# Patient Record
Sex: Male | Born: 1960 | Race: Black or African American | Hispanic: No | Marital: Married | Smoking: Never smoker
Health system: Southern US, Community
[De-identification: ages and names within clinical notes are randomized; demographics above are authoritative.]

## PROBLEM LIST (undated history)

## (undated) DIAGNOSIS — E119 Type 2 diabetes mellitus without complications: Secondary | ICD-10-CM

## (undated) DIAGNOSIS — I10 Essential (primary) hypertension: Secondary | ICD-10-CM

## (undated) HISTORY — PX: OTHER SURGICAL HISTORY: SHX169

---

## 2008-02-14 ENCOUNTER — Other Ambulatory Visit: Payer: Self-pay

## 2008-02-14 ENCOUNTER — Emergency Department: Payer: Self-pay | Admitting: Emergency Medicine

## 2008-08-23 ENCOUNTER — Emergency Department (HOSPITAL_COMMUNITY): Admission: EM | Admit: 2008-08-23 | Discharge: 2008-08-23 | Payer: Self-pay | Admitting: Emergency Medicine

## 2009-05-01 IMAGING — CT CT HEAD W/O CM
1 series · 16 of 30 positions shown, 20 images · non-contrast
Comparison: None.

CLINICAL DATA: Facial numbness.

CT HEAD WITHOUT CONTRAST
TECHNIQUE: Contiguous axial images were obtained from the base of
the skull through the vertex without contrast.

[Series 2: head_seq 4.5 h37s st · axial · 0.43mm/px · z∈[-176,-50]mm · 16 of 32 slices shown, 20 images]
[im 2/32  brain]
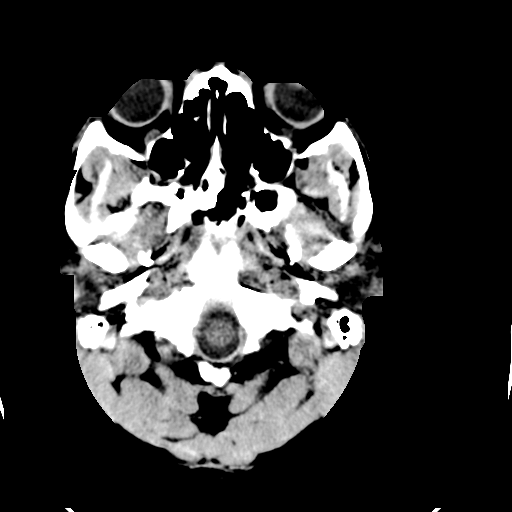
[im 2/32  bone]
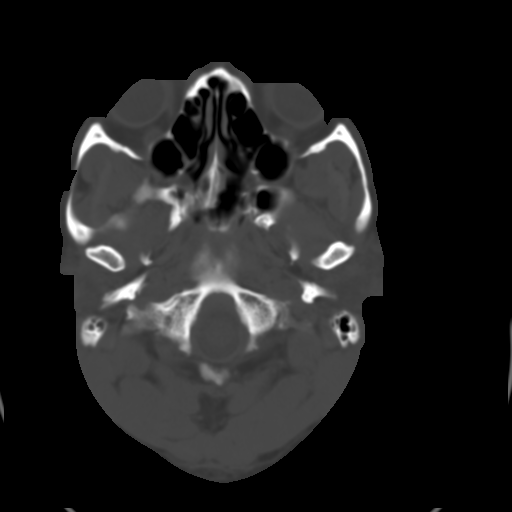
[im 4/32  brain]
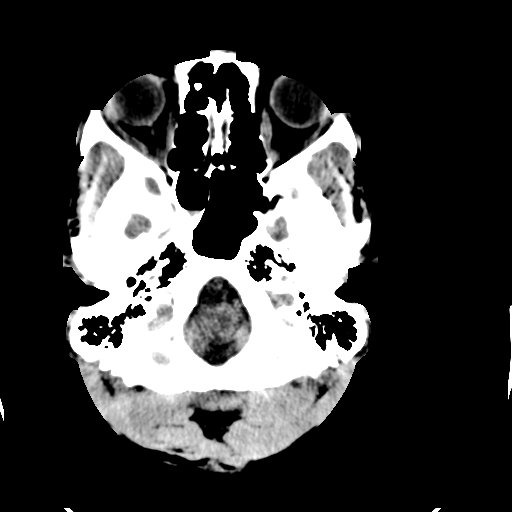
[im 6/32  brain]
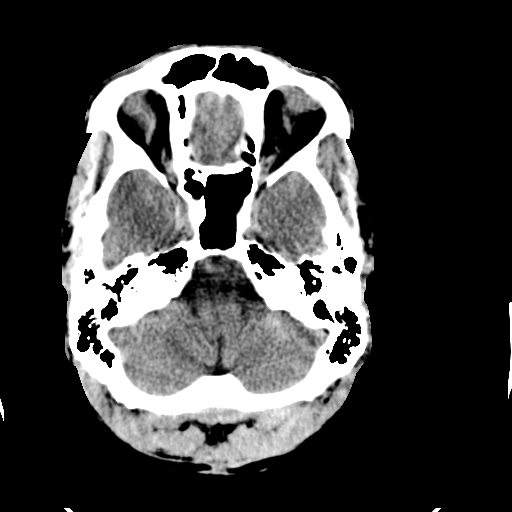
[im 8/32  brain]
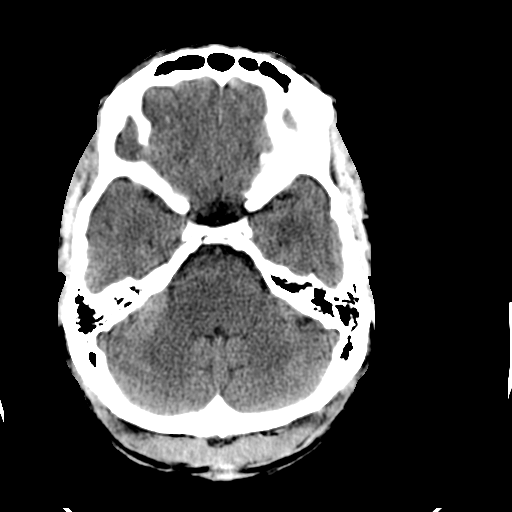
[im 9/32  brain]
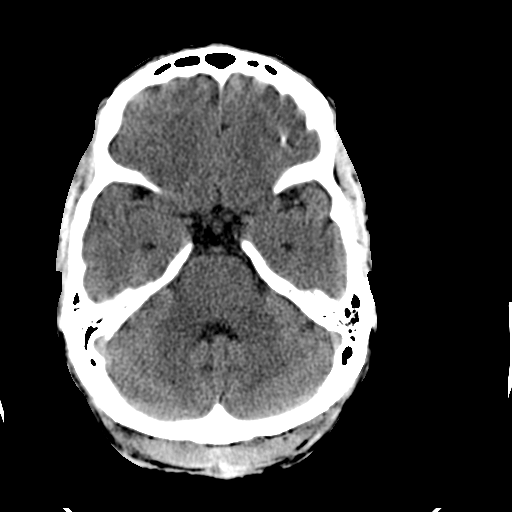
[im 9/32  bone]
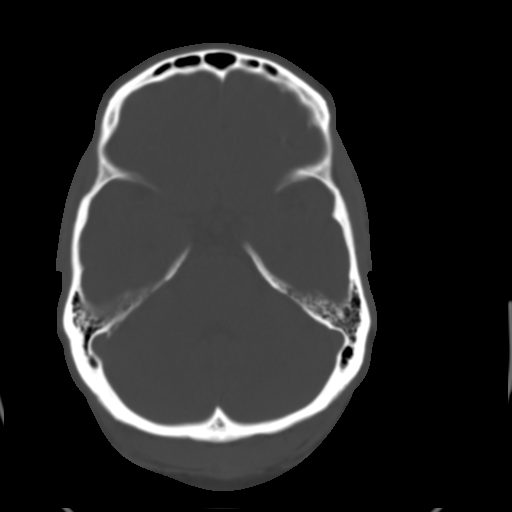
[im 11/32  brain]
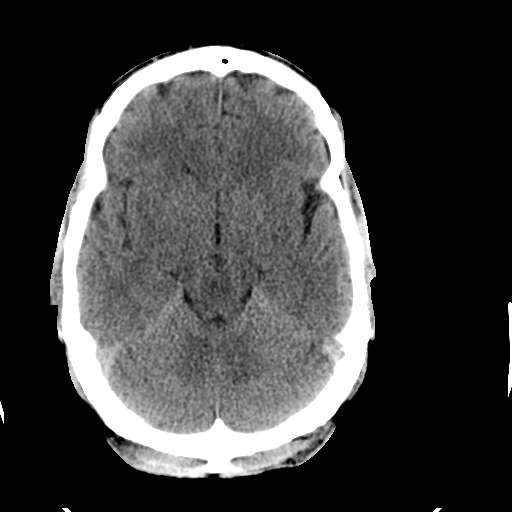
[im 13/32  brain]
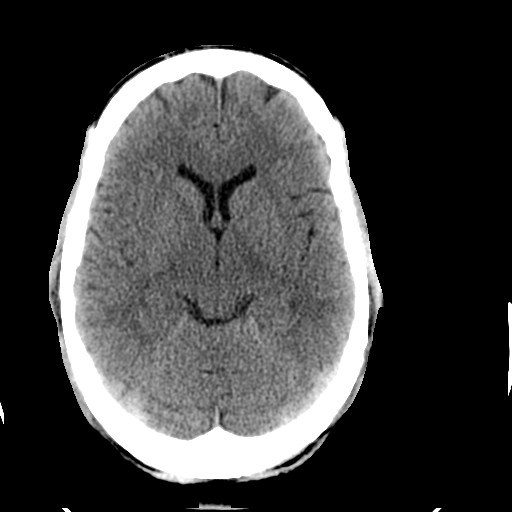
[im 15/32  brain]
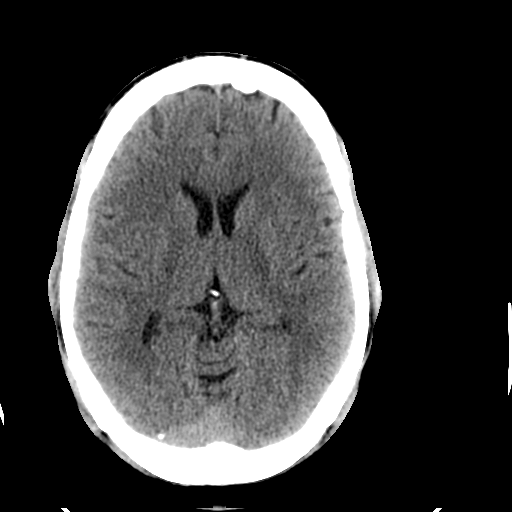
[im 17/32  brain]
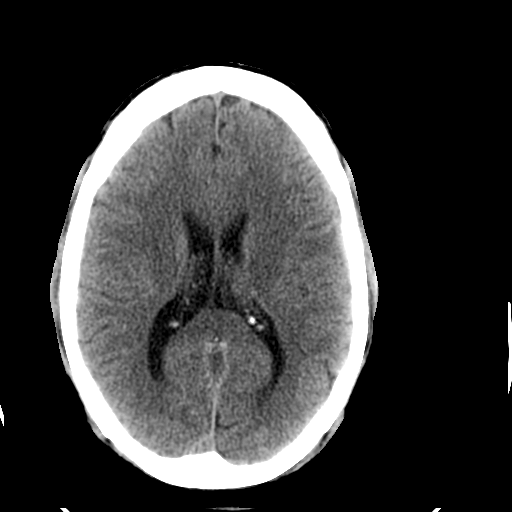
[im 17/32  bone]
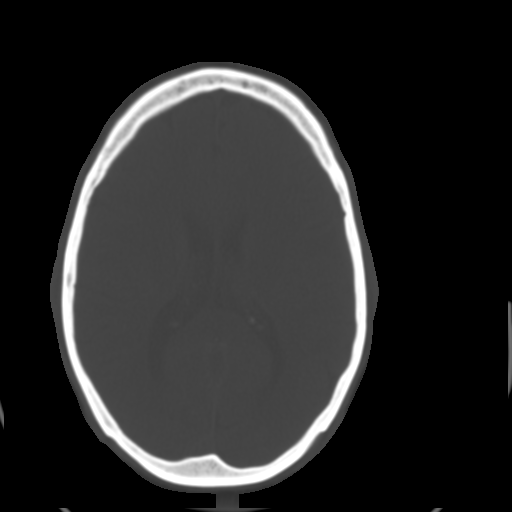
[im 19/32  brain]
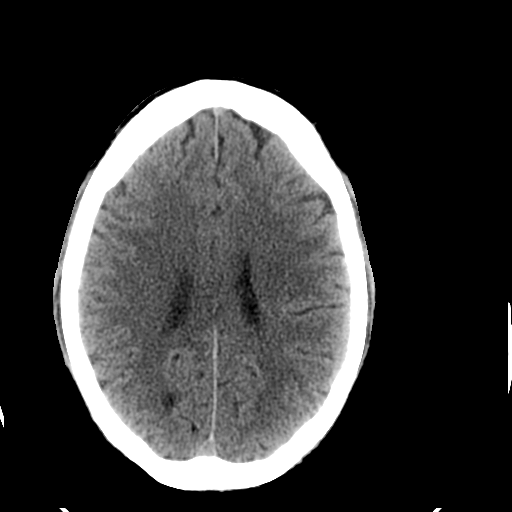
[im 21/32  brain]
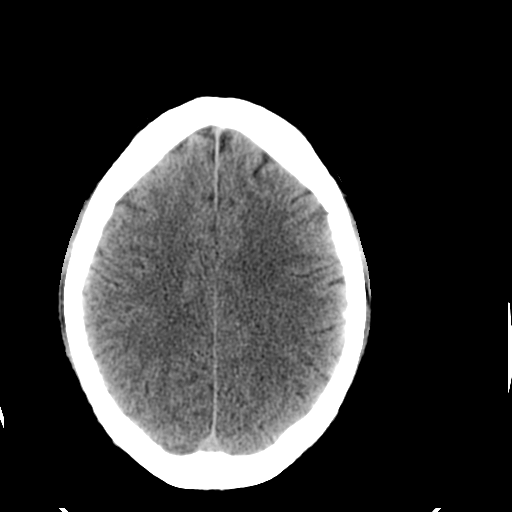
[im 23/32  brain]
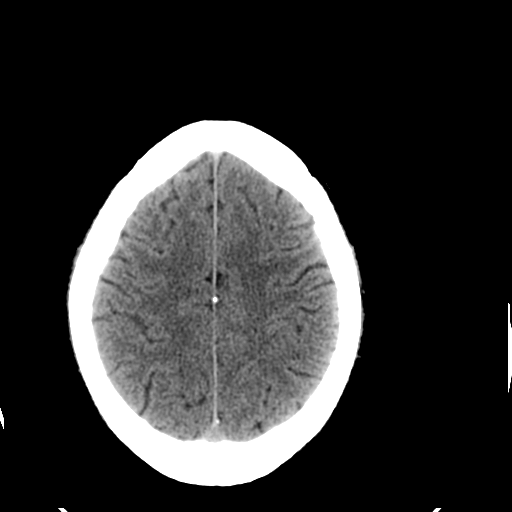
[im 24/32  brain]
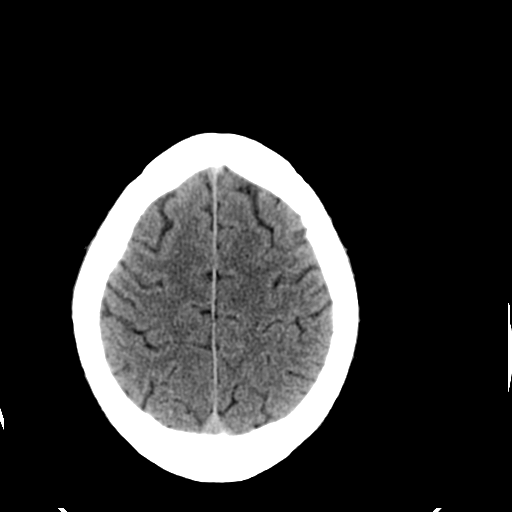
[im 24/32  bone]
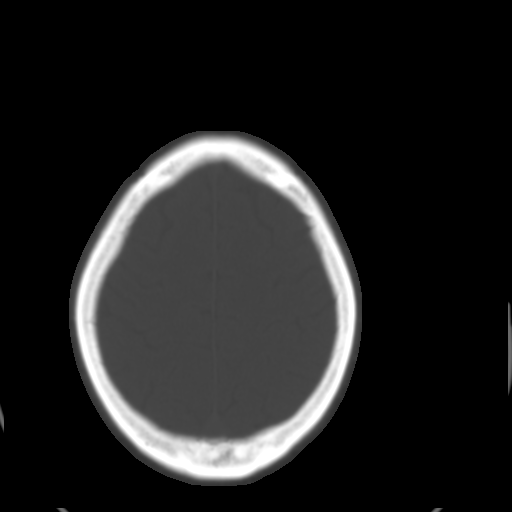
[im 26/32  brain]
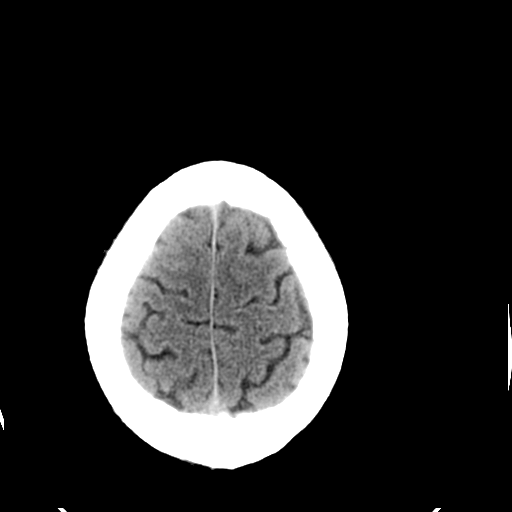
[im 28/32  brain]
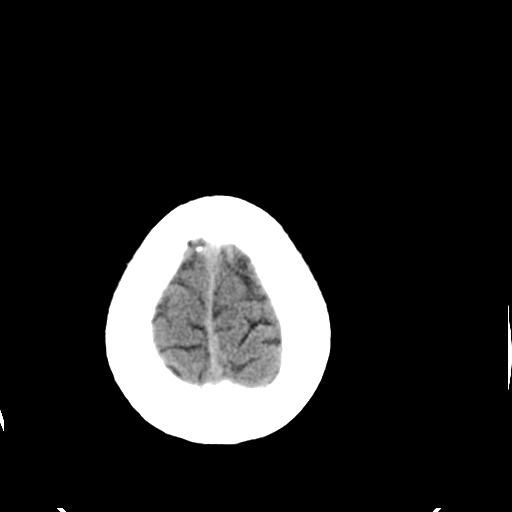
[im 30/32  brain]
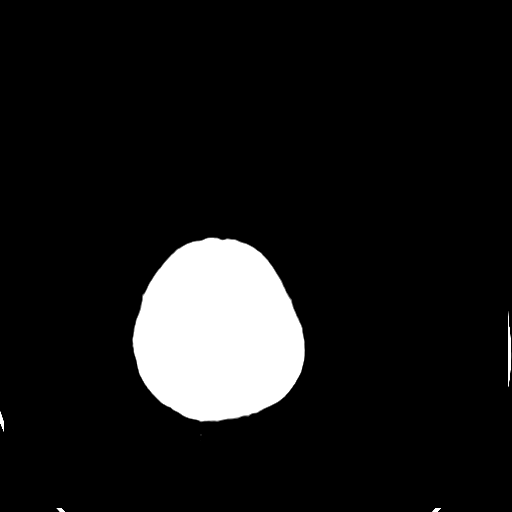

[16 of 30 positions shown; findings below may reference images not displayed]

FINDINGS: Normal appearing cerebral hemispheres and posterior fossa
structures.  Normal size and position of the ventricles.  No
intracranial hemorrhage, mass lesion or CT evidence of acute
infarction.  Right sphenoid sinus air-fluid level.  Unremarkable
bones.
IMPRESSION: Acute right sphenoid sinusitis.  No intracranial abnormality.

## 2010-08-24 LAB — POCT I-STAT, CHEM 8
BUN: 13 mg/dL (ref 6–23)
Calcium, Ion: 1.14 mmol/L (ref 1.12–1.32)
Chloride: 102 mEq/L (ref 96–112)
HCT: 43 % (ref 39.0–52.0)
Potassium: 3.7 mEq/L (ref 3.5–5.1)
Sodium: 141 mEq/L (ref 135–145)

## 2012-04-11 LAB — HM HEPATITIS C SCREENING LAB: HM Hepatitis Screen: NEGATIVE

## 2012-10-08 DIAGNOSIS — R809 Proteinuria, unspecified: Secondary | ICD-10-CM | POA: Insufficient documentation

## 2012-10-11 DIAGNOSIS — W3400XA Accidental discharge from unspecified firearms or gun, initial encounter: Secondary | ICD-10-CM | POA: Insufficient documentation

## 2012-10-11 DIAGNOSIS — I10 Essential (primary) hypertension: Secondary | ICD-10-CM | POA: Insufficient documentation

## 2014-09-22 LAB — BASIC METABOLIC PANEL
BUN: 15 mg/dL (ref 4–21)
Creatinine: 1.4 mg/dL — AB (ref <=1.3)
Glucose: 182 mg/dL
Potassium: 4.5 mmol/L (ref 3.4–5.3)
Sodium: 140 mmol/L (ref 137–147)

## 2014-09-22 LAB — LIPID PANEL
Cholesterol: 156 mg/dL (ref 0–200)
HDL: 67 mg/dL (ref 35–70)
LDL Cholesterol: 75 mg/dL
Triglycerides: 70 mg/dL (ref 40–160)

## 2014-09-22 LAB — HEMOGLOBIN A1C: HEMOGLOBIN A1C: 9.2 % — AB (ref 4.0–6.0)

## 2014-09-29 ENCOUNTER — Encounter: Payer: 59 | Attending: Surgery | Admitting: Surgery

## 2014-09-29 DIAGNOSIS — I87332 Chronic venous hypertension (idiopathic) with ulcer and inflammation of left lower extremity: Secondary | ICD-10-CM | POA: Insufficient documentation

## 2014-09-29 DIAGNOSIS — E11622 Type 2 diabetes mellitus with other skin ulcer: Secondary | ICD-10-CM | POA: Diagnosis not present

## 2014-09-29 DIAGNOSIS — Z89611 Acquired absence of right leg above knee: Secondary | ICD-10-CM | POA: Diagnosis not present

## 2014-09-29 DIAGNOSIS — I10 Essential (primary) hypertension: Secondary | ICD-10-CM | POA: Diagnosis not present

## 2014-09-29 DIAGNOSIS — L97322 Non-pressure chronic ulcer of left ankle with fat layer exposed: Secondary | ICD-10-CM | POA: Diagnosis not present

## 2014-10-01 NOTE — Progress Notes (Signed)
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 Lemming BSN, RN Entered By: Christin Fudge on 09/29/2014 11:32:08 INMAN, FETTIG ([id number]) KAELYN, NAUTA ([id number]) -------------------------------------------------------------------------------- SuperBill Details Patient Name: Robert [name] Date of Service: 09/29/2014 Medical Record Number: [id number] Patient Account Number: [id number] Date of Birth/Sex: 1960-12-18 (54 y.o. 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 Facility Procedures CPT4: Description Modifier Quantity Code 64403474 [id number] - WOUND CARE VISIT-LEV 3 EST PT 1 CPT4: [id number] 97597 - DEBRIDE WOUND 1ST 20 SQ CM OR < 1 ICD-10 Description Diagnosis 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 I87.332 Chronic venous hypertension  (idiopathic) with ulcer and inflammation of left lower extremity Physician Procedures CPT4: Description Modifier Quantity Code 5643329 [id number] - WC PHYS LEVEL 4 - NEW PT 1 ICD-10 Description Diagnosis 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

## 2014-10-01 NOTE — Progress Notes (Signed)
Robert [name], Robert [name] ([id number]) Visit Report for 09/29/2014 Abuse/Suicide Risk Screen Details Patient Name: Robert [name], Robert [name] Date of Service: 09/29/2014 10:15 AM Medical Record Number: [id number] Patient Account Number: [id number] Date of Birth/Sex: 1960-08-19 (54 y.o. Male) Treating RN: Baruch Gouty, RN, BSN, Velva Harman Primary Care Physician: Lelon Huh Other Clinician: Referring Physician: Treating Physician/Extender: Frann Rider in Treatment: 0 Abuse/Suicide Risk Screen Items Answer ABUSE/SUICIDE RISK SCREEN: Has anyone close to you tried to hurt or harm you recentlyo No Do you feel uncomfortable with anyone in your familyo No Has anyone forced you do things that you didnot want to doo No Do you have any thoughts of harming yourselfo No Patient displays signs or symptoms of abuse and/or neglect. No Electronic Signature(s) Signed: 09/30/2014 5:13:49 PM By: Regan Lemming BSN, RN Entered By: Regan Lemming on 09/29/2014 10:21:29 Robert [name] ([id number]) -------------------------------------------------------------------------------- Activities of Daily Living Details Patient Name: Robert [name] Date of Service: 09/29/2014 10:15 AM Medical Record Number: [id number] Patient Account Number: [id number] Date of Birth/Sex: 1960/07/24 (54 y.o. Male) Treating RN: Baruch Gouty, RN, BSN, Velva Harman Primary Care Physician: Lelon Huh Other Clinician: Referring Physician: Treating Physician/Extender: Frann Rider in Treatment: 0 Activities of Daily Living Items Answer Activities of Daily Living (Please select one for each item) Drive Automobile Completely Able Take Medications Completely Able Use Telephone Completely Able Care for Appearance Completely Able Use Toilet Completely Able Bath / Shower Completely Able Dress Self Completely Able Feed Self Completely Able Walk Completely Able Get In / Out Bed Completely Able Housework Completely Able Prepare Meals Completely [place] for Self Completely Able Electronic Signature(s) Signed: 09/30/2014 5:13:49 PM By: Regan Lemming BSN, RN Entered By: Regan Lemming on 09/29/2014 10:21:17 Robert [name] ([id number]) -------------------------------------------------------------------------------- Education Assessment Details Patient Name: Robert [name] Date of Service: 09/29/2014 10:15 AM Medical Record Number: [id number] Patient Account Number: [id number] Date of Birth/Sex: 1960/10/06 (54 y.o. Male) Treating RN: Baruch Gouty, RN, BSN, Velva Harman Primary Care Physician: Lelon Huh Other Clinician: Referring Physician: Treating Physician/Extender: Frann Rider in Treatment: 0 Primary Learner Assessed: Patient Learning Preferences/Education Level/Primary Language Learning Preference: Explanation Highest Education Level: College or Above Preferred Language: English Cognitive Barrier Assessment/Beliefs Language Barrier: No Physical Barrier Assessment Impaired Vision: Yes Glasses Impaired Hearing: No Decreased Hand dexterity: No Knowledge/Comprehension Assessment Knowledge Level: High Comprehension Level: High Ability to understand written High instructions: Ability to understand verbal High instructions: Motivation Assessment Anxiety Level: Calm Cooperation: Cooperative Education Importance: Acknowledges Need Interest in Health Problems: Asks Questions Perception: Coherent Willingness to Engage in Self- High Management Activities: Readiness to Engage in Self- High Management Activities: Electronic Signature(s) Signed: 09/30/2014 5:13:49 PM By: Regan Lemming BSN, RN Entered By: Regan Lemming on 09/29/2014 10:20:56 Robert [name], Robert [name] ([id number]) -------------------------------------------------------------------------------- Fall Risk Assessment Details Patient Name: Robert [name] Date of Service: 09/29/2014 10:15 AM Medical Record Number: [id number] Patient Account Number: [id number] Date of  Birth/Sex: [birth year] (54 y.o. Male) Treating RN: Baruch Gouty, RN, BSN, Velva Harman Primary Care Physician: Lelon Huh Other Clinician: Referring Physician: Treating Physician/Extender: Frann Rider in Treatment: 0 Fall Risk Assessment Items FALL RISK ASSESSMENT: History of falling - immediate or within 3 months 0 No Secondary diagnosis 0 No Ambulatory aid None/bed rest/wheelchair/nurse 0 Yes Crutches/cane/walker 0 No Furniture 0 No IV Access/Saline Lock 0 No Gait/Training Normal/bed rest/immobile 0 Yes Weak 0 No Impaired 0 No Mental Status Oriented to own ability 0 Yes Electronic Signature(s) Signed: 09/30/2014 5:13:49 PM By: Regan Lemming BSN, RN Entered By: Regan Lemming on 09/29/2014  10:20:15 Robert [name], Robert [name] ([id number]) -------------------------------------------------------------------------------- Foot Assessment Details Patient Name: Robert [name], Robert [name] Date of Service: 09/29/2014 10:15 AM Medical Record Number: [id number] Patient Account Number: [id number] Date of Birth/Sex: 08-Mar-1961 (53 y.o. Male) Treating RN: Baruch Gouty, RN, BSN, Velva Harman Primary Care Physician: Lelon Huh Other Clinician: Referring Physician: Treating Physician/Extender: Frann Rider in Treatment: 0 Foot Assessment Items Site Locations + = Sensation present, - = Sensation absent, C = Callus, U = Ulcer R = Redness, W = Warmth, M = Maceration, PU = Pre-ulcerative lesion F = Fissure, S = Swelling, D = Dryness Assessment Right: Left: Other Deformity: No No Prior Foot Ulcer: No No Prior Amputation: No No Charcot Joint: No No Ambulatory Status: Ambulatory Without Help Gait: Steady Electronic Signature(s) Signed: 09/30/2014 5:13:49 PM By: Regan Lemming BSN, RN Entered By: Regan Lemming on 09/29/2014 10:19:41 Robert [name] ([id number]) -------------------------------------------------------------------------------- Nutrition Risk Assessment Details Patient Name: Robert [name] Date of Service: 09/29/2014  10:15 AM Medical Record Number: [id number] Patient Account Number: [id number] Date of Birth/Sex: [birth year] (54 y.o. Male) Treating RN: Baruch Gouty, RN, BSN, Velva Harman Primary Care Physician: Lelon Huh Other Clinician: Referring Physician: Treating Physician/Extender: Frann Rider in Treatment: 0 Height (in): Weight (lbs): Body Mass Index (BMI): Nutrition Risk Assessment Items NUTRITION RISK SCREEN: I have an illness or condition that made me change the kind and/or 0 No amount of food I eat I eat fewer than two meals per day 0 No I eat few fruits and vegetables, or milk products 0 No I have three or more drinks of beer, liquor or wine almost every day 0 No I have tooth or mouth problems that make it hard for me to eat 0 No I don't always have enough money to buy the food I need 0 No I eat alone most of the time 0 No I take three or more different prescribed or over-the-counter drugs a 0 No day Without wanting to, I have lost or gained 10 pounds in the last six 0 No months I am not always physically able to shop, cook and/or feed myself 0 No Nutrition Protocols Good Risk Protocol 0 No interventions needed Moderate Risk Protocol Electronic Signature(s) Signed: 09/30/2014 5:13:49 PM By: Regan Lemming BSN, RN Entered By: Regan Lemming on 09/29/2014 10:19:58

## 2014-10-01 NOTE — Progress Notes (Signed)
[employer], [employer] ([id number]) Visit Report for 09/29/2014 Allergy List Details Patient Name: Robert [name], Robert [name] Date of Service: 09/29/2014 10:15 AM Medical Record Number: [id number] Patient Account Number: [id number] Date of Birth/Sex: 09-21-1960 (53 y.o. Male) Treating RN: Baruch Gouty, RN, BSN, Velva Harman Primary Care Physician: Lelon Huh Other Clinician: Referring Physician: Treating Physician/Extender: Frann Rider in Treatment: 0 Allergies Active Allergies No known Allergies Allergy Notes Electronic Signature(s) Signed: 09/30/2014 5:13:49 PM By: Regan Lemming BSN, RN Entered By: Regan Lemming on 09/29/2014 10:21:50 Robert [name] ([id number]) -------------------------------------------------------------------------------- Arrival Information Details Patient Name: Robert [name] Date of Service: 09/29/2014 10:15 AM Medical Record Number: [id number] Patient Account Number: [id number] Date of Birth/Sex: Oct 10, 1960 (54 y.o. Male) Treating RN: Baruch Gouty, RN, BSN, Velva Harman Primary Care Physician: Lelon Huh Other Clinician: Referring Physician: Treating Physician/Extender: Frann Rider in Treatment: 0 Visit Information Patient Arrived: Ambulatory Arrival Time: 10:18 Accompanied By: self Transfer Assistance: None Patient Identification Verified: Yes Secondary Verification Process Yes Completed: Patient Requires Transmission- No Based Precautions: Patient Has Alerts: Yes Patient Alerts: Patient on Blood Thinner DMII 09/29/14 ABI L:1.19 Electronic Signature(s) Signed: 09/30/2014 5:13:49 PM By: Regan Lemming BSN, RN Entered By: Regan Lemming on 09/29/2014 14:28:44 Robert [name] ([id number]) -------------------------------------------------------------------------------- Clinic Level of Care Assessment Details Patient Name: Robert [name] Date of Service: 09/29/2014 10:15 AM Medical Record Number: [id number] Patient Account Number: [id number] Date of Birth/Sex: Dec 06, 1960 (54 y.o.  Male) Treating RN: Baruch Gouty, RN, BSN, [place] Harman Primary Care Physician: Lelon Huh Other Clinician: Referring Physician: Treating Physician/Extender: Frann Rider in Treatment: 0 Clinic Level of Care Assessment Items TOOL 1 Quantity Score []  - Use when EandM and Procedure is performed on INITIAL visit 0 ASSESSMENTS - Nursing Assessment / Reassessment []  - General Physical Exam (combine w/ comprehensive assessment (listed just 0 below) when performed on new pt. evals) X - Comprehensive Assessment (HX, ROS, Risk Assessments, Wounds Hx, etc.) 1 25 ASSESSMENTS - Wound and Skin Assessment / Reassessment []  - Dermatologic / Skin Assessment (not related to wound area) 0 ASSESSMENTS - Ostomy and/or Continence Assessment and Care []  - Incontinence Assessment and Management 0 []  - Ostomy Care Assessment and Management (repouching, etc.) 0 PROCESS - Coordination of Care X - Simple Patient / Family Education for ongoing care 1 15 []  - Complex (extensive) Patient / Family Education for ongoing care 0 X - Staff obtains Consents, Records, Test Results / Process Orders 1 10 []  - Staff telephones HHA, Nursing Homes / Clarify orders / etc 0 []  - Routine Transfer to another Facility (non-emergent condition) 0 []  - Routine Hospital Admission (non-emergent condition) 0 X - New Admissions / Biomedical engineer / Ordering NPWT, Apligraf, etc. 1 15 []  - Emergency Hospital Admission (emergent condition) 0 PROCESS - Special Needs []  - Pediatric / Minor Patient Management 0 []  - Isolation Patient Management 0 Robert [name], Robert [name] ([id number]) []  - Hearing / Language / Visual special needs 0 []  - Assessment of Community assistance (transportation, D/C planning, etc.) 0 []  - Additional assistance / Altered mentation 0 []  - Support Surface(s) Assessment (bed, cushion, seat, etc.) 0 INTERVENTIONS - Miscellaneous []  - External ear exam 0 []  - Patient Transfer (multiple staff / Civil Service fast streamer / Similar devices)  0 []  - Simple Staple / Suture removal (25 or less) 0 []  - Complex Staple / Suture removal (26 or more) 0 []  - Hypo/Hyperglycemic Management (do not check if billed separately) 0 X - Ankle / Brachial Index (ABI) - do not check if billed separately 1 15 Has  the patient been seen at the hospital within the last three years: Yes Total Score: 80 Level Of Care: New/Established - Level 3 Electronic Signature(s) Signed: 09/30/2014 5:13:49 PM By: Regan Lemming BSN, RN Entered By: Regan Lemming on 09/29/2014 11:21:11 Robert [name] ([id number]) -------------------------------------------------------------------------------- Encounter Discharge Information Details Patient Name: Robert [name] Date of Service: 09/29/2014 10:15 AM Medical Record Number: [id number] Patient Account Number: [id number] Date of Birth/Sex: 06/20/1960 (54 y.o. Male) Treating RN: Baruch Gouty, RN, BSN, Velva Harman Primary Care Physician: Lelon Huh Other Clinician: Referring Physician: Treating Physician/Extender: Frann Rider in Treatment: 0 Encounter Discharge Information Items Discharge Pain Level: 0 Discharge Condition: Stable Ambulatory Status: Ambulatory Discharge Destination: Home Transportation: Private Auto Accompanied By: SELF Schedule Follow-up Appointment: No Medication Reconciliation completed and provided to Patient/Care No Nash Bolls: Provided on Clinical Summary of Care: 09/30/2014 Form Type Recipient Paper Patient JE Electronic Signature(s) Signed: 09/30/2014 5:13:49 PM By: Regan Lemming BSN, RN Previous Signature: 09/29/2014 11:31:23 AM Version By: Ruthine Dose Entered By: Regan Lemming on 09/29/2014 11:33:26 Robert [name] ([id number]) -------------------------------------------------------------------------------- Lower Extremity Assessment Details Patient Name: Robert [name] Date of Service: 09/29/2014 10:15 AM Medical Record Number: [id number] Patient Account Number: [id number] Date of Birth/Sex:  Sep 24, 1960 (54 y.o. Male) Treating RN: Baruch Gouty, RN, BSN, Velva Harman Primary Care Physician: Lelon Huh Other Clinician: Referring Physician: Treating Physician/Extender: Frann Rider in Treatment: 0 Edema Assessment Assessed: [Left: No] [Right: No] E[Left: dema] [Right: :] Calf Left: Right: Point of Measurement: 32 cm From Medial Instep 39.5 cm cm Ankle Left: Right: Point of Measurement: 8 cm From Medial Instep 20 cm cm Vascular Assessment Claudication: Claudication Assessment [Left:None] Pulses: Posterior Tibial Dorsalis Pedis Palpable: [Left:Yes] Extremity colors, hair growth, and conditions: Extremity Color: [Left:Normal] Hair Growth on Extremity: [Left:Yes] Temperature of Extremity: [Left:Warm] Capillary Refill: [Left:< 3 seconds] Dependent Rubor: [Left:No] Blanched when Elevated: [Left:No] Lipodermatosclerosis: [Left:No] Blood Pressure: Brachial: [Left:151] Dorsalis Pedis: 180 [Left:Dorsalis Pedis:] Ankle: Posterior Tibial: [Left:Posterior Tibial: 1.19] Toe Nail Assessment Left: Right: Thick: No Discolored: No Robert [name], Robert [name] ([id number]) Deformed: No Improper Length and Hygiene: No Electronic Signature(s) Signed: 09/30/2014 5:13:49 PM By: Regan Lemming BSN, RN Entered By: Regan Lemming on 09/29/2014 10:57:36 Robert [name] ([id number]) -------------------------------------------------------------------------------- Multi Wound Chart Details Patient Name: Robert [name] Date of Service: 09/29/2014 10:15 AM Medical Record Number: [id number] Patient Account Number: [id number] Date of Birth/Sex: 1960-08-23 (54 y.o. Male) Treating RN: Baruch Gouty, RN, BSN, [place] Primary Care Physician: Lelon Huh Other Clinician: Referring Physician: Treating Physician/Extender: Frann Rider in Treatment: 0 Vital Signs Height(in): 65 Pulse(bpm): 94 Weight(lbs): 193.6 Blood Pressure 151/82 (mmHg): Body Mass Index(BMI): 32 Temperature(F): 98.5 Respiratory  Rate 17 (breaths/min): Photos: [1:No Photos] [N/A:N/A] Wound Location: [1:Left Lower Leg - Medial] [N/A:N/A] Wounding Event: [1:Gradually Appeared] [N/A:N/A] Primary Etiology: [1:Diabetic Wound/Ulcer of the Lower Extremity] [N/A:N/A] Comorbid History: [1:Hypertension, Type II Diabetes] [N/A:N/A] Date Acquired: [1:08/30/2014] [N/A:N/A] Weeks of Treatment: [1:0] [N/A:N/A] Wound Status: [1:Open] [N/A:N/A] Measurements L x W x D 2.5x1x0.5 [N/A:N/A] (cm) Area (cm) : [1:1.963] [N/A:N/A] Volume (cm) : [1:0.982] [N/A:N/A] % Reduction in Area: [1:0.00%] [N/A:N/A] % Reduction in Volume: 0.00% [N/A:N/A] Classification: [1:Grade 1] [N/A:N/A] Exudate Amount: [1:Medium] [N/A:N/A] Exudate Type: [1:Serosanguineous] [N/A:N/A] Exudate Color: [1:red, brown] [N/A:N/A] Wound Margin: [1:Distinct, outline attached] [N/A:N/A] Granulation Amount: [1:Medium (34-66%)] [N/A:N/A] Granulation Quality: [1:Pink] [N/A:N/A] Necrotic Amount: [1:Small (1-33%)] [N/A:N/A] Exposed Structures: [1:Fascia: No Fat: No Tendon: No Muscle: No Joint: No Bone: No] [N/A:N/A] Limited to Skin Breakdown Periwound Skin Texture: Edema: No N/A N/A Excoriation: No Induration: No Callus: No Crepitus: No  Fluctuance: No Friable: No Rash: No Scarring: No Periwound Skin Moist: Yes N/A N/A Moisture: Maceration: No Dry/Scaly: No Periwound Skin Color: Atrophie Blanche: No N/A N/A Cyanosis: No Ecchymosis: No Erythema: No Hemosiderin Staining: No Mottled: No Pallor: No Rubor: No Temperature: No Abnormality N/A N/A Tenderness on No N/A N/A Palpation: Wound Preparation: Topical Anesthetic N/A N/A Applied: Other: Lidocaine 4% Ointment Treatment Notes Electronic Signature(s) Signed: 09/30/2014 5:13:49 PM By: Regan Lemming BSN, RN Entered By: Regan Lemming on 09/29/2014 11:14:37 Robert [name] ([id number]) -------------------------------------------------------------------------------- Multi-Disciplinary Care Plan  Details Patient Name: Robert [name] Date of Service: 09/29/2014 10:15 AM Medical Record Number: [id number] Patient Account Number: [id number] Date of Birth/Sex: November 20, 1960 (54 y.o. Male) Treating RN: Baruch Gouty, RN, BSN, Velva Harman Primary Care Physician: Lelon Huh Other Clinician: Referring Physician: Treating Physician/Extender: Frann Rider in Treatment: 0 Active Inactive Abuse / Safety / Falls / Self Care Management Nursing Diagnoses: Potential for falls Goals: Patient will remain injury free Date Initiated: 09/29/2014 Goal Status: Active Patient/caregiver will verbalize understanding of skin care regimen Date Initiated: 09/29/2014 Goal Status: Active Patient/caregiver will verbalize/demonstrate measures taken to prevent injury and/or falls Date Initiated: 09/29/2014 Goal Status: Active Patient/caregiver will verbalize/demonstrate understanding of what to do in case of emergency Date Initiated: 09/29/2014 Goal Status: Active Interventions: Assess fall risk on admission and as needed Provide education on fall prevention Provide education on safe transfers Notes: Nutrition Nursing Diagnoses: Impaired glucose control: actual or potential Goals: Patient/caregiver verbalizes understanding of need to maintain therapeutic glucose control per primary care physician Date Initiated: 09/29/2014 Goal Status: Active Robert [name], Robert [name] ([id number]) Patient/caregiver will maintain therapeutic glucose control Date Initiated: 09/29/2014 Goal Status: Active Interventions: Assess HgA1c results as ordered upon admission and as needed Provide education on elevated blood sugars and impact on wound healing Provide education on nutrition Treatment Activities: Obtain HgA1c : 09/29/2014 Notes: Orientation to the Wound Care Program Nursing Diagnoses: Knowledge deficit related to the wound healing center program Goals: Patient/caregiver will verbalize understanding of the [place] Date Initiated: 09/29/2014 Goal Status: Active Interventions: Provide education on orientation to the wound center Notes: Wound/Skin Impairment Nursing Diagnoses: Impaired tissue integrity Knowledge deficit related to ulceration/compromised skin integrity Goals: Patient/caregiver will verbalize understanding of skin care regimen Date Initiated: 09/29/2014 Goal Status: Active Ulcer/skin breakdown will heal within 14 weeks Date Initiated: 09/29/2014 Goal Status: Active Interventions: Assess patient/caregiver ability to obtain necessary supplies Assess patient/caregiver ability to perform ulcer/skin care regimen upon admission and as needed Assess ulceration(s) every visit Robert [name], Robert [name] ([id number]) Provide education on ulcer and skin care Treatment Activities: Patient referred to home care : 09/29/2014 Skin care regimen initiated : 09/29/2014 Topical wound management initiated : 09/29/2014 Notes: Electronic Signature(s) Signed: 09/30/2014 5:13:49 PM By: Regan Lemming BSN, RN Entered By: Regan Lemming on 09/29/2014 11:14:17 Robert [name] ([id number]) -------------------------------------------------------------------------------- Pain Assessment Details Patient Name: Robert [name] Date of Service: 09/29/2014 10:15 AM Medical Record Number: [id number] Patient Account Number: [id number] Date of Birth/Sex: [birth year] (55 y.o. Male) Treating RN: Baruch Gouty, RN, BSN, [place] Primary Care Physician: Lelon Huh Other Clinician: Referring Physician: Treating Physician/Extender: Frann Rider in Treatment: 0 Active Problems Location of Pain Severity and Description of Pain Patient Has Paino No Site Locations Pain Management and Medication Current Pain Management: Electronic Signature(s) Signed: 09/30/2014 5:13:49 PM By: Regan Lemming BSN, RN Entered By: Regan Lemming on 09/29/2014 10:30:05 Robert [name]  ([id number]) -------------------------------------------------------------------------------- Patient/Caregiver Education Details Patient Name: Robert [name] Date of Service: 09/29/2014 10:15 AM Medical  Record Number: [id number] Patient Account Number: [id number] Date of Birth/Gender: May 29, 1960 (54 y.o. Male) Treating RN: Baruch Gouty, RN, BSN, [place] Primary Care Physician: Lelon Huh Other Clinician: Referring Physician: Treating Physician/Extender: Frann Rider in Treatment: 0 Education Assessment Education Provided To: Patient Education Topics Provided Basic Hygiene: Methods: Explain/Verbal Responses: State content correctly Wound/Skin Impairment: Methods: Explain/Verbal Responses: State content correctly Electronic Signature(s) Signed: 09/30/2014 5:13:49 PM By: Regan Lemming BSN, RN Entered By: Regan Lemming on 09/29/2014 11:33:43 Robert [name] ([id number]) -------------------------------------------------------------------------------- Wound Assessment Details Patient Name: Robert [name] Date of Service: 09/29/2014 10:15 AM Medical Record Number: [id number] Patient Account Number: [id number] Date of Birth/Sex: [birth year] (54 y.o. Male) Treating RN: Baruch Gouty, RN, BSN, [place] Primary Care Physician: Lelon Huh Other Clinician: Referring Physician: Treating Physician/Extender: Frann Rider in Treatment: 0 Wound Status Wound Number: 1 Primary Diabetic Wound/Ulcer of the Lower Etiology: Extremity Wound Location: Left Lower Leg - Medial Wound Status: Open Wounding Event: Gradually Appeared Comorbid Hypertension, Type II Diabetes Date Acquired: 08/30/2014 History: Weeks Of Treatment: 0 Clustered Wound: No Photos Photo Uploaded By: Regan Lemming on 09/29/2014 14:32:58 Wound Measurements Length: (cm) 2.5 Width: (cm) 1 Depth: (cm) 0.5 Area: (cm) 1.963 Volume: (cm) 0.982 % Reduction in Area: 0% % Reduction in Volume: 0% Tunneling: No Undermining: No Wound  Description Classification: Grade 1 Wound Margin: Distinct, outline attached Exudate Amount: Medium Exudate Type: Serosanguineous Exudate Color: red, brown Foul Odor After Cleansing: No Wound Bed Granulation Amount: Medium (34-66%) Exposed Structure Granulation Quality: Pink Fascia Exposed: No Necrotic Amount: Small (1-33%) Fat Layer Exposed: No Necrotic Quality: Adherent Slough Tendon Exposed: No KOHE[place], Robert [name] ([id number]) Muscle Exposed: No Joint Exposed: No Bone Exposed: No Limited to Skin Breakdown Periwound Skin Texture Texture Color No Abnormalities Noted: No No Abnormalities Noted: No Callus: No Atrophie Blanche: No Crepitus: No Cyanosis: No Excoriation: No Ecchymosis: No Fluctuance: No Erythema: No Friable: No Hemosiderin Staining: No Induration: No Mottled: No Localized Edema: No Pallor: No Rash: No Rubor: No Scarring: No Temperature / Pain Moisture Temperature: No Abnormality No Abnormalities Noted: No Dry / Scaly: No Maceration: No Moist: Yes Wound Preparation Topical Anesthetic Applied: Other: Lidocaine 4% Ointment, Treatment Notes Wound #1 (Left, Medial Lower Leg) 1. Cleansed with: Clean wound with Normal Saline 4. Dressing Applied: Aquacel Ag 5. Secondary Dressing Applied Bordered Foam Dressing 7. Secured with Patient to wear own compression stockings Electronic Signature(s) Signed: 09/30/2014 5:13:49 PM By: Regan Lemming BSN, RN Entered By: Regan Lemming on 09/29/2014 10:41:27 Robert [name] ([id number]) -------------------------------------------------------------------------------- Vitals Details Patient Name: Robert [name] Date of Service: 09/29/2014 10:15 AM Medical Record Number: [id number] Patient Account Number: [id number] Date of Birth/Sex: [birth year] (54 y.o. Male) Treating RN: Baruch Gouty, RN, BSN, [place] Primary Care Physician: Lelon Huh Other Clinician: Referring Physician: Treating Physician/Extender: Frann Rider in  Treatment: 0 Vital Signs Time Taken: 10:30 Temperature (F): 98.5 Height (in): 65 Pulse (bpm): 94 Source: Stated Respiratory Rate (breaths/min): 17 Weight (lbs): 193.6 Blood Pressure (mmHg): 151/82 Source: Measured Reference Range: 80 - 120 mg / dl Body Mass Index (BMI): 32.2 Electronic Signature(s) Signed: 09/30/2014 5:13:49 PM By: Regan Lemming BSN, RN Entered By: Regan Lemming on 09/29/2014 10:33:32

## 2014-10-06 ENCOUNTER — Encounter: Payer: 59 | Admitting: Surgery

## 2014-10-06 DIAGNOSIS — L97322 Non-pressure chronic ulcer of left ankle with fat layer exposed: Secondary | ICD-10-CM | POA: Diagnosis not present
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
## 2014-10-06 NOTE — Progress Notes (Signed)
Robert [name], Robert [name] ([id number]) Visit Report for 10/06/2014 Arrival Information Details Patient Name: Robert [name], Robert [name] Date of Service: 10/06/2014 11:30 AM Medical Record Number: [id number] Patient Account Number: [id number] Date of Birth/Sex: 1960-12-25 (53 y.o. Male) Treating RN: Montey Hora Primary Care Physician: Lelon Huh Other Clinician: Referring Physician: Lelon Huh Treating Physician/Extender: Frann Rider in Treatment: 1 Visit Information History Since Last Visit Added or deleted any medications: No Patient Arrived: Ambulatory Any new allergies or adverse reactions: No Arrival Time: 12:05 Had a fall or experienced change in No Accompanied By: self activities of daily living that may affect Transfer Assistance: None risk of falls: Patient Identification Verified: Yes Signs or symptoms of abuse/neglect since last No Secondary Verification Process Yes visito Completed: Hospitalized since last visit: No Patient Requires Transmission- No Pain Present Now: No Based Precautions: Patient Has Alerts: Yes Patient Alerts: Patient on Blood Thinner DMII 09/29/14 ABI L:1.19 Electronic Signature(s) Signed: 10/06/2014 3:12:27 PM By: Montey Hora Entered By: Montey Hora on 10/06/2014 12:05:43 Robert [name] ([id number]) -------------------------------------------------------------------------------- Encounter Discharge Information Details Patient Name: Robert [name] Date of Service: 10/06/2014 11:30 AM Medical Record Number: [id number] Patient Account Number: [id number] Date of Birth/Sex: [birth year] (54 y.o. Male) Treating RN: Primary Care Physician: Lelon Huh Other Clinician: Referring Physician: Lelon Huh Treating Physician/Extender: Frann Rider in Treatment: 1 Encounter Discharge Information Items Discharge Pain Level: 0 Discharge Condition: Stable Ambulatory Status: Ambulatory Discharge Destination: Home Transportation: Private  Auto Accompanied By: self Schedule Follow-up Appointment: Yes Medication Reconciliation completed and provided to Patient/Care No Daley Gosse: Provided on Clinical Summary of Care: 10/06/2014 Form Type Recipient Paper Patient JE Electronic Signature(s) Signed: 10/06/2014 3:12:27 PM By: Montey Hora Previous Signature: 10/06/2014 12:24:29 PM Version By: Ruthine Dose Entered By: Montey Hora on 10/06/2014 12:26:51 Robert [name] ([id number]) -------------------------------------------------------------------------------- Lower Extremity Assessment Details Patient Name: Robert [name] Date of Service: 10/06/2014 11:30 AM Medical Record Number: [id number] Patient Account Number: [id number] Date of Birth/Sex: [birth year] (53 y.o. Male) Treating RN: Montey Hora Primary Care Physician: Lelon Huh Other Clinician: Referring Physician: Lelon Huh Treating Physician/Extender: Frann Rider in Treatment: 1 Edema Assessment Assessed: [Left: Yes] [Right: No] Edema: [Left: Ye] [Right: s] Calf Left: Right: Point of Measurement: 32 cm From Medial Instep 37.7 cm cm Ankle Left: Right: Point of Measurement: 11 cm From Medial Instep 19.7 cm cm Vascular Assessment Claudication: Claudication Assessment [Left:Intermittent] Pulses: Posterior Tibial Palpable: [Left:Yes] Dorsalis Pedis Palpable: [Left:Yes] Extremity colors, hair growth, and conditions: Extremity Color: [Left:Hyperpigmented] Hair Growth on Extremity: [Left:Yes] Temperature of Extremity: [Left:Warm] Capillary Refill: [Left:< 3 seconds] Dependent Rubor: [Left:No] Blanched when Elevated: [Left:No] Toe Nail Assessment Left: Right: Thick: No Discolored: No Deformed: No Improper Length and Hygiene: No Robert [name], Robert [name] ([id number]) Electronic Signature(s) Signed: 10/06/2014 3:12:27 PM By: Montey Hora Entered By: Montey Hora on 10/06/2014 12:11:25 Robert [name]  ([id number]) -------------------------------------------------------------------------------- Multi Wound Chart Details Patient Name: Robert [name] Date of Service: 10/06/2014 11:30 AM Medical Record Number: [id number] Patient Account Number: [id number] Date of Birth/Sex: [birth year] (54 y.o. Male) Treating RN: Montey Hora Primary Care Physician: Lelon Huh Other Clinician: Referring Physician: Lelon Huh Treating Physician/Extender: Frann Rider in Treatment: 1 Vital Signs Height(in): 65 Pulse(bpm): 81 Weight(lbs): 193.6 Blood Pressure 142/68 (mmHg): Body Mass Index(BMI): 32 Temperature(F): 98.7 Respiratory Rate 18 (breaths/min): Photos: [1:No Photos] [N/A:N/A] Wound Location: [1:Left Lower Leg - Medial] [N/A:N/A] Wounding Event: [1:Gradually Appeared] [N/A:N/A] Primary Etiology: [1:Diabetic Wound/Ulcer of the Lower Extremity] [N/A:N/A] Comorbid History: [1:Hypertension, Type II Diabetes] [N/A:N/A] Date Acquired: [1:08/30/2014] [N/A:N/A]  Weeks of Treatment: [1:1] [N/A:N/A] Wound Status: [1:Open] [N/A:N/A] Measurements L x W x D 0.6x0.2x0.1 [N/A:N/A] (cm) Area (cm) : [1:0.094] [N/A:N/A] Volume (cm) : [1:0.009] [N/A:N/A] % Reduction in Area: [1:95.20%] [N/A:N/A] % Reduction in Volume: 99.10% [N/A:N/A] Classification: [1:Grade 1] [N/A:N/A] Exudate Amount: [1:Medium] [N/A:N/A] Exudate Type: [1:Serosanguineous] [N/A:N/A] Exudate Color: [1:red, brown] [N/A:N/A] Wound Margin: [1:Distinct, outline attached] [N/A:N/A] Granulation Amount: [1:None Present (0%)] [N/A:N/A] Necrotic Amount: [1:Medium (34-66%)] [N/A:N/A] Exposed Structures: [1:Fascia: No Fat: No Tendon: No Muscle: No Joint: No Bone: No] [N/A:N/A] Limited to Skin Breakdown Epithelialization: Medium (34-66%) N/A N/A Periwound Skin Texture: Edema: No N/A N/A Excoriation: No Induration: No Callus: No Crepitus: No Fluctuance: No Friable: No Rash: No Scarring: No Periwound Skin Maceration:  No N/A N/A Moisture: Moist: No Dry/Scaly: No Periwound Skin Color: Hemosiderin Staining: Yes N/A N/A Atrophie Blanche: No Cyanosis: No Ecchymosis: No Erythema: No Mottled: No Pallor: No Rubor: No Temperature: No Abnormality N/A N/A Tenderness on No N/A N/A Palpation: Wound Preparation: Ulcer Cleansing: N/A N/A Rinsed/Irrigated with Saline Topical Anesthetic Applied: Other: Lidocaine 4% Ointment Treatment Notes Electronic Signature(s) Signed: 10/06/2014 3:12:27 PM By: Montey Hora Entered By: Montey Hora on 10/06/2014 12:12:26 Robert [name] ([id number]) -------------------------------------------------------------------------------- [place] Details Patient Name: Robert [name] Date of Service: 10/06/2014 11:30 AM Medical Record Number: [id number] Patient Account Number: [id number] Date of Birth/Sex: May 15, 1961 (53 y.o. Male) Treating RN: Montey Hora Primary Care Physician: Lelon Huh Other Clinician: Referring Physician: Lelon Huh Treating Physician/Extender: Frann Rider in Treatment: 1 Active Inactive Abuse / Safety / Falls / Self Care Management Nursing Diagnoses: Potential for falls Goals: Patient will remain injury free Date Initiated: 09/29/2014 Goal Status: Active Patient/caregiver will verbalize understanding of skin care regimen Date Initiated: 09/29/2014 Goal Status: Active Patient/caregiver will verbalize/demonstrate measures taken to prevent injury and/or falls Date Initiated: 09/29/2014 Goal Status: Active Patient/caregiver will verbalize/demonstrate understanding of what to do in case of emergency Date Initiated: 09/29/2014 Goal Status: Active Interventions: Assess fall risk on admission and as needed Provide education on fall prevention Provide education on safe transfers Notes: Nutrition Nursing Diagnoses: Impaired glucose control: actual or potential Goals: Patient/caregiver verbalizes  understanding of need to maintain therapeutic glucose control per primary care physician Date Initiated: 09/29/2014 Goal Status: Active Robert [name], Robert [name] ([id number]) Patient/caregiver will maintain therapeutic glucose control Date Initiated: 09/29/2014 Goal Status: Active Interventions: Assess HgA1c results as ordered upon admission and as needed Provide education on elevated blood sugars and impact on wound healing Provide education on nutrition Treatment Activities: Obtain HgA1c : 10/06/2014 Notes: Orientation to the Wound Care Program Nursing Diagnoses: Knowledge deficit related to the wound healing center program Goals: Patient/caregiver will verbalize understanding of the [place] Program Date Initiated: 09/29/2014 Goal Status: Active Interventions: Provide education on orientation to the wound center Notes: Wound/Skin Impairment Nursing Diagnoses: Impaired tissue integrity Knowledge deficit related to ulceration/compromised skin integrity Goals: Patient/caregiver will verbalize understanding of skin care regimen Date Initiated: 09/29/2014 Goal Status: Active Ulcer/skin breakdown will heal within 14 weeks Date Initiated: 09/29/2014 Goal Status: Active Interventions: Assess patient/caregiver ability to obtain necessary supplies Assess patient/caregiver ability to perform ulcer/skin care regimen upon admission and as needed Assess ulceration(s) every visit Robert [name], Robert [name] ([id number]) Provide education on ulcer and skin care Treatment Activities: Patient referred to home care : 10/06/2014 Skin care regimen initiated : 10/06/2014 Topical wound management initiated : 10/06/2014 Notes: Electronic Signature(s) Signed: 10/06/2014 3:12:27 PM By: Montey Hora Entered By: Montey Hora on 10/06/2014 12:12:20 Robert [name] ([id number]) --------------------------------------------------------------------------------  Patient/Caregiver Education Details Patient Name:  Robert [name], Robert [name] Date of Service: 10/06/2014 11:30 AM Medical Record Number: [id number] Patient Account Number: [id number] Date of Birth/Gender: 30-Jun-1960 (54 y.o. Male) Treating RN: Montey Hora Primary Care Physician: Lelon Huh Other Clinician: Referring Physician: Lelon Huh Treating Physician/Extender: Frann Rider in Treatment: 1 Education Assessment Education Provided To: Patient Education Topics Provided Wound/Skin Impairment: Handouts: Other: wound care as ordered Methods: Demonstration, Explain/Verbal Responses: State content correctly Electronic Signature(s) Signed: 10/06/2014 12:14:14 PM By: Montey Hora Entered By: Montey Hora on 10/06/2014 12:14:14 Robert [name] ([id number]) -------------------------------------------------------------------------------- Wound Assessment Details Patient Name: Robert [name] Date of Service: 10/06/2014 11:30 AM Medical Record Number: [id number] Patient Account Number: [id number] Date of Birth/Sex: May 04, 1961 (54 y.o. Male) Treating RN: Montey Hora Primary Care Physician: Lelon Huh Other Clinician: Referring Physician: Lelon Huh Treating Physician/Extender: Frann Rider in Treatment: 1 Wound Status Wound Number: 1 Primary Diabetic Wound/Ulcer of the Lower Etiology: Extremity Wound Location: Left Lower Leg - Medial Wound Status: Open Wounding Event: Gradually Appeared Comorbid Hypertension, Type II Diabetes Date Acquired: 08/30/2014 History: Weeks Of Treatment: 1 Clustered Wound: No Photos Photo Uploaded By: Montey Hora on 10/06/2014 15:00:52 Wound Measurements Length: (cm) 0.6 Width: (cm) 0.2 Depth: (cm) 0.1 Area: (cm) 0.094 Volume: (cm) 0.009 % Reduction in Area: 95.2% % Reduction in Volume: 99.1% Epithelialization: Medium (34-66%) Tunneling: No Undermining: No Wound Description Classification: Grade 1 Wound Margin: Distinct, outline attached Exudate Amount:  Medium Exudate Type: Serosanguineous Exudate Color: red, brown Foul Odor After Cleansing: No Wound Bed Granulation Amount: None Present (0%) Exposed Structure Necrotic Amount: Medium (34-66%) Fascia Exposed: No Necrotic Quality: Adherent Slough Fat Layer Exposed: No Tendon Exposed: No Robert [name], Robert [name] ([id number]) Muscle Exposed: No Joint Exposed: No Bone Exposed: No Limited to Skin Breakdown Periwound Skin Texture Texture Color No Abnormalities Noted: No No Abnormalities Noted: No Callus: No Atrophie Blanche: No Crepitus: No Cyanosis: No Excoriation: No Ecchymosis: No Fluctuance: No Erythema: No Friable: No Hemosiderin Staining: Yes Induration: No Mottled: No Localized Edema: No Pallor: No Rash: No Rubor: No Scarring: No Temperature / Pain Moisture Temperature: No Abnormality No Abnormalities Noted: No Dry / Scaly: No Maceration: No Moist: No Wound Preparation Ulcer Cleansing: Rinsed/Irrigated with Saline Topical Anesthetic Applied: Other: Lidocaine 4% Ointment, Treatment Notes Wound #1 (Left, Medial Lower Leg) 1. Cleansed with: Clean wound with Normal Saline 2. Anesthetic Topical Lidocaine 4% cream to wound bed prior to debridement 3. Peri-wound Care: Skin Prep 4. Dressing Applied: Aquacel Ag 5. Secondary Dressing Applied Bordered Foam Dressing Electronic Signature(s) Signed: 10/06/2014 3:12:27 PM By: Montey Hora Entered By: Montey Hora on 10/06/2014 12:12:15 Robert [name] ([id number]) -------------------------------------------------------------------------------- [place] Details Patient Name: Robert [name] Date of Service: 10/06/2014 11:30 AM Medical Record Number: [id number] Patient Account Number: [id number] Date of Birth/Sex: 28-Jan-1961 (54 y.o. Male) Treating RN: Montey Hora Primary Care Physician: Lelon Huh Other Clinician: Referring Physician: Lelon Huh Treating Physician/Extender: Frann Rider in Treatment:  1 Vital Signs Time Taken: 12:06 Temperature (F): 98.7 Height (in): 65 Pulse (bpm): 81 Weight (lbs): 193.6 Respiratory Rate (breaths/min): 18 Body Mass Index (BMI): 32.2 Blood Pressure (mmHg): 142/68 Reference Range: 80 - 120 mg / dl Electronic Signature(s) Signed: 10/06/2014 3:12:27 PM By: Montey Hora Entered By: Montey Hora on 10/06/2014 12:07:36

## 2014-10-13 ENCOUNTER — Encounter: Payer: 59 | Admitting: Surgery

## 2014-10-13 DIAGNOSIS — L97322 Non-pressure chronic ulcer of left ankle with fat layer exposed: Secondary | ICD-10-CM | POA: Diagnosis not present
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
## 2014-10-15 NOTE — Progress Notes (Signed)
Robert [name], Robert [name] ([id number]) Visit Report for 10/13/2014 Arrival Information Details Patient Name: Robert [name], Robert [name] Date of Service: 10/13/2014 9:30 AM Medical Record Number: [id number] Patient Account Number: [id number] Date of Birth/Sex: 1960/09/28 (53 y.o. Male) Treating RN: Baruch Gouty, RN, BSN, Velva Harman Primary Care Physician: Lelon Huh Other Clinician: Referring Physician: Lelon Huh Treating Physician/Extender: Frann Rider in Treatment: 2 Visit Information History Since Last Visit Any new allergies or adverse reactions: No Patient Arrived: Ambulatory Had a fall or experienced change in No Arrival Time: 09:33 activities of daily living that may affect Accompanied By: self risk of falls: Transfer Assistance: None Signs or symptoms of abuse/neglect since last No Patient Identification Verified: Yes visito Secondary Verification Process Yes Hospitalized since last visit: No Completed: Has Dressing in Place as Prescribed: Yes Patient Requires Transmission- No Pain Present Now: No Based Precautions: Patient Has Alerts: Yes Patient Alerts: Patient on Blood Thinner DMII 09/29/14 ABI L:1.19 Electronic Signature(s) Signed: 10/14/2014 4:53:09 PM By: Regan Lemming BSN, RN Entered By: Regan Lemming on 10/13/2014 09:33:42 Robert [name] ([id number]) -------------------------------------------------------------------------------- Clinic Level of Care Assessment Details Patient Name: Robert [name] Date of Service: 10/13/2014 9:30 AM Medical Record Number: [id number] Patient Account Number: [id number] Date of Birth/Sex: June 07, 1960 (54 y.o. Male) Treating RN: Junious Dresser Primary Care Physician: Lelon Huh Other Clinician: Referring Physician: Lelon Huh Treating Physician/Extender: Frann Rider in Treatment: 2 Clinic Level of Care Assessment Items TOOL 4 Quantity Score []  - Use when only an EandM is performed on FOLLOW-UP visit 0 ASSESSMENTS - Nursing Assessment  / Reassessment []  - Reassessment of Co-morbidities (includes updates in patient status) 0 []  - Reassessment of Adherence to Treatment Plan 0 ASSESSMENTS - Wound and Skin Assessment / Reassessment []  - Simple Wound Assessment / Reassessment - one wound 0 []  - Complex Wound Assessment / Reassessment - multiple wounds 0 []  - Dermatologic / Skin Assessment (not related to wound area) 0 ASSESSMENTS - Focused Assessment X - Circumferential Edema Measurements - multi extremities 1 5 []  - Nutritional Assessment / Counseling / Intervention 0 X - Lower Extremity Assessment (monofilament, tuning fork, pulses) 1 5 []  - Peripheral Arterial Disease Assessment (using hand held doppler) 0 ASSESSMENTS - Ostomy and/or Continence Assessment and Care []  - Incontinence Assessment and Management 0 []  - Ostomy Care Assessment and Management (repouching, etc.) 0 PROCESS - Coordination of Care X - Simple Patient / Family Education for ongoing care 1 15 []  - Complex (extensive) Patient / Family Education for ongoing care 0 []  - Staff obtains Programmer, systems, Records, Test Results / Process Orders 0 []  - Staff telephones HHA, Nursing Homes / Clarify orders / etc 0 []  - Routine Transfer to another Facility (non-emergent condition) 0 Robert [name], Robert [name] ([id number]) []  - Routine Hospital Admission (non-emergent condition) 0 []  - New Admissions / Biomedical engineer / Ordering NPWT, Apligraf, etc. 0 []  - Emergency Hospital Admission (emergent condition) 0 X - Simple Discharge Coordination 1 10 []  - Complex (extensive) Discharge Coordination 0 PROCESS - Special Needs []  - Pediatric / Minor Patient Management 0 []  - Isolation Patient Management 0 []  - Hearing / Language / Visual special needs 0 []  - Assessment of Community assistance (transportation, D/C planning, etc.) 0 []  - Additional assistance / Altered mentation 0 []  - Support Surface(s) Assessment (bed, cushion, seat, etc.) 0 INTERVENTIONS - Wound Cleansing /  Measurement []  - Simple Wound Cleansing - one wound 0 []  - Complex Wound Cleansing - multiple wounds 0 X - Wound Imaging (photographs - any number  of wounds) 1 5 []  - Wound Tracing (instead of photographs) 0 []  - Simple Wound Measurement - one wound 0 []  - Complex Wound Measurement - multiple wounds 0 INTERVENTIONS - Wound Dressings []  - Small Wound Dressing one or multiple wounds 0 []  - Medium Wound Dressing one or multiple wounds 0 []  - Large Wound Dressing one or multiple wounds 0 []  - Application of Medications - topical 0 []  - Application of Medications - injection 0 INTERVENTIONS - Miscellaneous []  - External ear exam 0 Robert [name], Robert [name] ([id number]) []  - Specimen Collection (cultures, biopsies, blood, body fluids, etc.) 0 []  - Specimen(s) / Culture(s) sent or taken to Lab for analysis 0 []  - Patient Transfer (multiple staff / Harrel Lemon Lift / Similar devices) 0 []  - Simple Staple / Suture removal (25 or less) 0 []  - Complex Staple / Suture removal (26 or more) 0 []  - Hypo / Hyperglycemic Management (close monitor of Blood Glucose) 0 []  - Ankle / Brachial Index (ABI) - do not check if billed separately 0 X - Vital Signs 1 5 Has the patient been seen at the hospital within the last three years: Yes Total Score: 45 Level Of Care: New/Established - Level 2 Electronic Signature(s) Signed: 10/13/2014 2:10:43 PM By: Junious Dresser RN Entered By: Junious Dresser on 10/13/2014 10:09:08 Robert [name] ([id number]) -------------------------------------------------------------------------------- Encounter Discharge Information Details Patient Name: Robert [name] Date of Service: 10/13/2014 9:30 AM Medical Record Number: [id number] Patient Account Number: [id number] Date of Birth/Sex: [birth year] (54 y.o. Male) Treating RN: Baruch Gouty, RN, BSN, Velva Harman Primary Care Physician: Lelon Huh Other Clinician: Referring Physician: Lelon Huh Treating Physician/Extender: Frann Rider in  Treatment: 2 Encounter Discharge Information Items Discharge Pain Level: 0 Discharge Condition: Stable Ambulatory Status: Ambulatory Discharge Destination: Home Private Transportation: Auto Accompanied By: self Schedule Follow-up Appointment: No Medication Reconciliation completed and No provided to Patient/Care Robert [name]: Clinical Summary of Care: Electronic Signature(s) Signed: 10/14/2014 4:53:09 PM By: Regan Lemming BSN, RN Entered By: Regan Lemming on 10/13/2014 10:13:49 Robert [name] ([id number]) -------------------------------------------------------------------------------- Lower Extremity Assessment Details Patient Name: Robert [name] Date of Service: 10/13/2014 9:30 AM Medical Record Number: [id number] Patient Account Number: [id number] Date of Birth/Sex: 04-17-1961 (54 y.o. Male) Treating RN: Baruch Gouty, RN, BSN, Velva Harman Primary Care Physician: Lelon Huh Other Clinician: Referring Physician: Lelon Huh Treating Physician/Extender: Frann Rider in Treatment: 2 Edema Assessment Assessed: [Left: No] [Right: No] E[Left: dema] [Right: :] Calf Left: Right: Point of Measurement: 32 cm From Medial Instep 37.1 cm cm Ankle Left: Right: Point of Measurement: 11 cm From Medial Instep 19.1 cm cm Vascular Assessment Claudication: Claudication Assessment [Left:None] Pulses: Posterior Tibial Dorsalis Pedis Palpable: [Left:Yes] Extremity colors, hair growth, and conditions: Extremity Color: [Left:Normal] Hair Growth on Extremity: [Left:Yes] Temperature of Extremity: [Left:Warm] Capillary Refill: [Left:< 3 seconds] Dependent Rubor: [Left:No] Blanched when Elevated: [Left:No] Lipodermatosclerosis: [Left:No] Toe Nail Assessment Left: Right: Thick: No Discolored: No Deformed: No Improper Length and Hygiene: No Robert [name], Robert [name] ([id number]) Electronic Signature(s) Signed: 10/14/2014 4:53:09 PM By: Regan Lemming BSN, RN Entered By: Regan Lemming on 10/13/2014  09:43:36 Robert [name] ([id number]) -------------------------------------------------------------------------------- Multi Wound Chart Details Patient Name: Robert [name] Date of Service: 10/13/2014 9:30 AM Medical Record Number: [id number] Patient Account Number: [id number] Date of Birth/Sex: 1960-05-22 (54 y.o. Male) Treating RN: Junious Dresser Primary Care Physician: Lelon Huh Other Clinician: Referring Physician: Lelon Huh Treating Physician/Extender: Frann Rider in Treatment: 2 Vital Signs Height(in): 65 Pulse(bpm): 127 Weight(lbs): 193.6 Blood Pressure 148/81 (mmHg): Body Mass  Index(BMI): 32 Temperature(F): 98.2 Respiratory Rate 18 (breaths/min): Photos: [N/A:N/A] Wound Location: Left Lower Leg - Medial N/A N/A Wounding Event: Gradually Appeared N/A N/A Primary Etiology: Diabetic Wound/Ulcer of N/A N/A the Lower Extremity Comorbid History: Hypertension, Type II N/A N/A Diabetes Date Acquired: 08/30/2014 N/A N/A Weeks of Treatment: 2 N/A N/A Wound Status: Open N/A N/A Measurements L x W x D 0.1x0.1x0.1 N/A N/A (cm) Area (cm) : 0.008 N/A N/A Volume (cm) : 0.001 N/A N/A % Reduction in Area: 99.60% N/A N/A % Reduction in Volume: 99.90% N/A N/A Classification: Grade 1 N/A N/A Exudate Amount: Medium N/A N/A Exudate Type: Serosanguineous N/A N/A Exudate Color: red, brown N/A N/A Wound Margin: Distinct, outline attached N/A N/A Granulation Amount: None Present (0%) N/A N/A Necrotic Amount: Medium (34-66%) N/A N/A Exposed Structures: N/A N/A Robert [name], Robert [name] ([id number]) Fascia: No Fat: No Tendon: No Muscle: No Joint: No Bone: No Limited to Skin Breakdown Epithelialization: Medium (34-66%) N/A N/A Periwound Skin Texture: Edema: No N/A N/A Excoriation: No Induration: No Callus: No Crepitus: No Fluctuance: No Friable: No Rash: No Scarring: No Periwound Skin Dry/Scaly: Yes N/A N/A Moisture: Maceration: No Moist: No Periwound Skin  Color: Hemosiderin Staining: Yes N/A N/A Atrophie Blanche: No Cyanosis: No Ecchymosis: No Erythema: No Mottled: No Pallor: No Rubor: No Temperature: No Abnormality N/A N/A Tenderness on No N/A N/A Palpation: Wound Preparation: Ulcer Cleansing: N/A N/A Rinsed/Irrigated with Saline Topical Anesthetic Applied: Other: Lidocaine 4% Ointment Treatment Notes Electronic Signature(s) Signed: 10/13/2014 2:10:43 PM By: Junious Dresser RN Entered By: Junious Dresser on 10/13/2014 10:06:25 Robert [name] ([id number]) -------------------------------------------------------------------------------- Central Details Patient Name: Robert [name] Date of Service: 10/13/2014 9:30 AM Medical Record Number: [id number] Patient Account Number: [id number] Date of Birth/Sex: [birth year] (54 y.o. Male) Treating RN: Junious Dresser Primary Care Physician: Lelon Huh Other Clinician: Referring Physician: Lelon Huh Treating Physician/Extender: Frann Rider in Treatment: 2 Active Inactive Electronic Signature(s) Signed: 10/13/2014 2:10:43 PM By: Junious Dresser RN Entered By: Junious Dresser on 10/13/2014 10:24:58 Robert [name] ([id number]) -------------------------------------------------------------------------------- Pain Assessment Details Patient Name: Robert [name] Date of Service: 10/13/2014 9:30 AM Medical Record Number: [id number] Patient Account Number: [id number] Date of Birth/Sex: 03-21-1961 (55 y.o. Male) Treating RN: Baruch Gouty, RN, BSN, Velva Harman Primary Care Physician: Lelon Huh Other Clinician: Referring Physician: Lelon Huh Treating Physician/Extender: Frann Rider in Treatment: 2 Active Problems Location of Pain Severity and Description of Pain Patient Has Paino No Site Locations Pain Management and Medication Current Pain Management: Electronic Signature(s) Signed: 10/14/2014 4:53:09 PM By: Regan Lemming BSN, RN Entered By: Regan Lemming on  10/13/2014 09:33:50 Robert [name] ([id number]) -------------------------------------------------------------------------------- Patient/Caregiver Education Details Patient Name: Robert [name] Date of Service: 10/13/2014 9:30 AM Medical Record Number: [id number] Patient Account Number: [id number] Date of Birth/Gender: 10-20-1960 (54 y.o. Male) Treating RN: Baruch Gouty, RN, BSN, Velva Harman Primary Care Physician: Lelon Huh Other Clinician: Referring Physician: Lelon Huh Treating Physician/Extender: Frann Rider in Treatment: 2 Education Assessment Education Provided To: Patient Education Topics Provided Wound/Skin Impairment: Methods: Explain/Verbal Responses: State content correctly Electronic Signature(s) Signed: 10/14/2014 4:53:09 PM By: Regan Lemming BSN, RN Entered By: Regan Lemming on 10/13/2014 10:14:00 Robert [name] ([id number]) -------------------------------------------------------------------------------- Wound Assessment Details Patient Name: Robert [name] Date of Service: 10/13/2014 9:30 AM Medical Record Number: [id number] Patient Account Number: [id number] Date of Birth/Sex: 11-26-1960 (54 y.o. Male) Treating RN: Junious Dresser Primary Care Physician: Lelon Huh Other Clinician: Referring Physician: Lelon Huh Treating Physician/Extender: Frann Rider in Treatment: 2 Wound Status  Wound Number: 1 Primary Diabetic Wound/Ulcer of the Lower Etiology: Extremity Wound Location: Left Lower Leg - Medial Wound Status: Healed - Epithelialized Wounding Event: Gradually Appeared Comorbid Hypertension, Type II Diabetes Date Acquired: 08/30/2014 History: Weeks Of Treatment: 2 Clustered Wound: No Photos Wound Measurements Length: (cm) 0 % Reductio Width: (cm) 0 % Reductio Depth: (cm) 0 Epithelial Area: (cm) 0 Tunneling Volume: (cm) 0 n in Area: 100% n in Volume: 100% ization: Large (67-100%) : No Wound Description Classification: Grade  1 Wound Margin: Distinct, outline attached Exudate Amount: Medium Exudate Type: Serosanguineous Exudate Color: red, brown Foul Odor After Cleansing: No Wound Bed Granulation Amount: None Present (0%) Exposed Structure Necrotic Amount: None Present (0%) Fascia Exposed: No Fat Layer Exposed: No Tendon Exposed: No Muscle Exposed: No Robert [name], Robert [name] ([id number]) Joint Exposed: No Bone Exposed: No Limited to Skin Breakdown Periwound Skin Texture Texture Color No Abnormalities Noted: No No Abnormalities Noted: No Callus: No Atrophie Blanche: No Crepitus: No Cyanosis: No Excoriation: No Ecchymosis: No Fluctuance: No Erythema: No Friable: No Hemosiderin Staining: Yes Induration: No Mottled: No Localized Edema: No Pallor: No Rash: No Rubor: No Scarring: No Temperature / Pain Moisture Temperature: No Abnormality No Abnormalities Noted: No Dry / Scaly: Yes Maceration: No Moist: No Wound Preparation Ulcer Cleansing: Rinsed/Irrigated with Saline Topical Anesthetic Applied: Other: Lidocaine 4% Ointment, Electronic Signature(s) Signed: 10/13/2014 2:10:43 PM By: Junious Dresser RN Entered By: Junious Dresser on 10/13/2014 10:07:06 Robert [name] ([id number]) -------------------------------------------------------------------------------- Vitals Details Patient Name: Robert [name] Date of Service: 10/13/2014 9:30 AM Medical Record Number: [id number] Patient Account Number: [id number] Date of Birth/Sex: Aug 25, 1960 (54 y.o. Male) Treating RN: Baruch Gouty, RN, BSN, [place] Primary Care Physician: Lelon Huh Other Clinician: Referring Physician: Lelon Huh Treating Physician/Extender: Frann Rider in Treatment: 2 Vital Signs Time Taken: 09:33 Temperature (F): 98.2 Height (in): 65 Pulse (bpm): 127 Weight (lbs): 193.6 Respiratory Rate (breaths/min): 18 Body Mass Index (BMI): 32.2 Blood Pressure (mmHg): 148/81 Reference Range: 80 - 120 mg / dl Notes Repeated HR with  pulse oximetry; 126-128. Patient asymptomatic, calm with no distress. Electronic Signature(s) Signed: 10/14/2014 4:53:09 PM By: Regan Lemming BSN, RN Entered By: Regan Lemming on 10/13/2014 09:42:15

## 2014-10-21 ENCOUNTER — Telehealth: Payer: Self-pay | Admitting: Family Medicine

## 2014-10-21 NOTE — Telephone Encounter (Signed)
Please call in rx for Accucheck meter to [facility]. Thanks.

## 2014-10-21 NOTE — Telephone Encounter (Signed)
Pt wife, Rise Paganini called stating pt has rec'd Accu chek lancets and Accu Chek Aviva test strips.  Pt is needing a Rx for a Accu Chek meter sent to [facility].  JG#[phone]

## 2014-10-22 ENCOUNTER — Other Ambulatory Visit: Payer: Self-pay | Admitting: Family Medicine

## 2014-10-23 NOTE — Telephone Encounter (Signed)
Called in Accu chek meter into [facility]. Advised patient.

## 2014-11-10 ENCOUNTER — Ambulatory Visit: Payer: Self-pay | Admitting: Family Medicine

## 2014-11-10 DIAGNOSIS — N529 Male erectile dysfunction, unspecified: Secondary | ICD-10-CM | POA: Insufficient documentation

## 2014-11-10 DIAGNOSIS — K649 Unspecified hemorrhoids: Secondary | ICD-10-CM | POA: Insufficient documentation

## 2014-11-10 DIAGNOSIS — I519 Heart disease, unspecified: Secondary | ICD-10-CM | POA: Insufficient documentation

## 2014-11-10 DIAGNOSIS — Z89611 Acquired absence of right leg above knee: Secondary | ICD-10-CM | POA: Insufficient documentation

## 2014-11-10 DIAGNOSIS — R000 Tachycardia, unspecified: Secondary | ICD-10-CM | POA: Insufficient documentation

## 2014-11-23 ENCOUNTER — Encounter: Payer: Self-pay | Admitting: Family Medicine

## 2014-11-23 ENCOUNTER — Ambulatory Visit (INDEPENDENT_AMBULATORY_CARE_PROVIDER_SITE_OTHER): Payer: 59 | Admitting: Family Medicine

## 2014-11-23 DIAGNOSIS — IMO0002 Reserved for concepts with insufficient information to code with codable children: Secondary | ICD-10-CM

## 2014-11-23 DIAGNOSIS — E1165 Type 2 diabetes mellitus with hyperglycemia: Secondary | ICD-10-CM

## 2014-11-23 MED ORDER — CANAGLIFLOZIN 100 MG PO TABS: 100.0000 mg | ORAL_TABLET | Freq: Every day | ORAL | Status: DC

## 2014-11-23 NOTE — Progress Notes (Signed)
Patient ID: NIK GORRELL, male   DOB: [birth year], 54 y.o.   MRN: [id number]       Patient: Robert [name] Male    DOB: 1960/12/23   54 y.o.   MRN: [id number] Visit Date: 11/23/2014  Today's Provider: Lelon Huh, MD   Chief Complaint  Patient presents with  . Diabetes   Subjective:    HPI   Diabetes Mellitus Type II, Follow-up:   Lab Results  Component Value Date   HGBA1C 9.2* 09/22/2014    Last seen for diabetes 2 months ago.  Management changes included Advising pt to restart Humalog, and continue Lantus and Metformin. He reports fair compliance with treatment (pt taking Metformin 1000 mg once daily). He is not having side effects.  Current symptoms include hyperglycemia and have been unchanged. Home blood sugar records: fasting range: 130s. Pt did have FBS in the 90's  Episodes of hypoglycemia? no   Current Insulin Regimen: 50 IU of Lantis in the am, and 10 mg of Humalog anywhere from noon to evening. Doesn't check insulin before every meal, but gets hypoglycemic if he takes  Most Recent Eye Exam: 6 months Weight trend: stable Prior visit with dietician: no Current diet: in general, a "healthy" diet   Current exercise: walking   Pt requesting information on Invokana today.  Pertinent Labs:    Component Value Date/Time   CHOL 156 09/22/2014   TRIG 70 09/22/2014   CREATININE 1.4* 09/22/2014   CREATININE 1.3 08/23/2008 1952    Wt Readings from Last 3 Encounters:  11/23/14 195 lb (88.451 kg)  09/22/14 193 lb (87.544 kg)    ------------------------------------------------------------------------      No Known Allergies Previous Medications   ASPIRIN EC 81 MG TABLET    Take 81 mg by mouth daily.   CYCLOBENZAPRINE (FLEXERIL) 5 MG TABLET    Take 5 mg by mouth 3 (three) times daily.   DILTIAZEM (TIAZAC) 180 MG 24 HR CAPSULE       DOXYCYCLINE (VIBRA-TABS) 100 MG TABLET       GLUCOSE BLOOD (ACCU-CHEK AVIVA PLUS) TEST STRIP    ACCU-CHEK AVIVA PLUS (In Vitro  Strip)  1 (one) Strip Strip three times daily for 90 days  Quantity: 300;  Refills: 3   Ordered :05-Oct-2014  Lelon Huh MD;  Started 05-Oct-2014 Active Comments: Insulin requiring diabetes   HUMALOG KWIKPEN 100 UNIT/ML KIWKPEN       HYDROACTIVE DRESSINGS EX       IBUPROFEN (ADVIL,MOTRIN) 800 MG TABLET       LANTUS SOLOSTAR 100 UNIT/ML SOLOSTAR PEN       LOSARTAN (COZAAR) 100 MG TABLET    Take 1 tablet (100 mg total) by mouth daily.   METFORMIN (GLUCOPHAGE) 1000 MG TABLET    Take 1,000 mg by mouth daily with breakfast.    RIVAROXABAN (XARELTO) 20 MG TABS TABLET    Take 20 mg by mouth daily with supper.   TADALAFIL (CIALIS) 20 MG TABLET    Take 20 mg by mouth daily as needed.    Review of Systems  Constitutional: Negative for fever, chills, diaphoresis, activity change, appetite change, fatigue and unexpected weight change.  Respiratory: Negative for cough and shortness of breath.   Cardiovascular: Negative for chest pain, palpitations and leg swelling.  Endocrine: Negative for polydipsia, polyphagia and polyuria.    History  Substance Use Topics  . Smoking status: Never Smoker   . Smokeless tobacco: Never Used  . Alcohol Use: No   Objective:  BP 140/78 mmHg  Pulse 92  Temp(Src) 98.7 F (37.1 C) (Oral)  Resp 16  Ht 5\' 5"  (1.651 m)  Wt 195 lb (88.451 kg)  BMI 32.45 kg/m2  Physical Exam  General Appearance:    Alert, cooperative, no distress  Eyes:    PERRL, conjunctiva/corneas clear, EOM's intact       Lungs:     Clear to auscultation bilaterally, respirations unlabored  Heart:    Regular rate and rhythm  Neurologic:   Awake, alert, oriented x 3. No apparent focal neurological           defect.            Assessment & Plan:     1. Diabetes mellitus type 2, uncontrolled Labile blood sugar on Lantus and Humalog. Will put Humalog on hold and try Invokana.  - canagliflozin (INVOKANA) 100 MG TABS tablet; Take 1 tablet (100 mg total) by mouth daily.  Dispense: 30  tablet; Refill: 0    Follow up: 1 month.       Lelon Huh, MD  [facility]

## 2014-11-24 ENCOUNTER — Other Ambulatory Visit: Payer: Self-pay | Admitting: Family Medicine

## 2014-12-14 ENCOUNTER — Other Ambulatory Visit: Payer: Self-pay | Admitting: Family Medicine

## 2014-12-14 MED ORDER — METFORMIN HCL 1000 MG PO TABS: 1000.0000 mg | ORAL_TABLET | Freq: Every day | ORAL | Status: DC

## 2014-12-14 NOTE — Telephone Encounter (Signed)
Last office visit was on 11/23/2014

## 2014-12-14 NOTE — Telephone Encounter (Signed)
Refill request for Metformin 500 mg x2 qd Last filled by MD on- 04/14/2013 #180 x3 Last Appt: 11/23/2014 Next Appt: 12/28/2014 Please advise refill?

## 2014-12-14 NOTE — Telephone Encounter (Signed)
Pt contacted office for refill request on the following medications:  metFORMIN (GLUCOPHAGE) 1000 MG.  30 day supply.  [facility].  FM#[phone]/UZ

## 2014-12-28 ENCOUNTER — Ambulatory Visit (INDEPENDENT_AMBULATORY_CARE_PROVIDER_SITE_OTHER): Payer: 59 | Admitting: Family Medicine

## 2014-12-28 ENCOUNTER — Encounter: Payer: Self-pay | Admitting: Family Medicine

## 2014-12-28 VITALS — BP 122/70 | HR 71 | Temp 98.3°F | Resp 16 | Wt 190.0 lb

## 2014-12-28 DIAGNOSIS — I10 Essential (primary) hypertension: Secondary | ICD-10-CM

## 2014-12-28 DIAGNOSIS — E1165 Type 2 diabetes mellitus with hyperglycemia: Secondary | ICD-10-CM

## 2014-12-28 DIAGNOSIS — IMO0002 Reserved for concepts with insufficient information to code with codable children: Secondary | ICD-10-CM

## 2014-12-28 MED ORDER — CANAGLIFLOZIN 100 MG PO TABS: 100.0000 mg | ORAL_TABLET | Freq: Every day | ORAL | Status: DC

## 2014-12-28 NOTE — Progress Notes (Signed)
Patient: Robert [name] Male    DOB: 1960/10/01   54 y.o.   MRN: [id number] Visit Date: 12/28/2014  Today's Provider: Lelon Huh, MD   Chief Complaint  Patient presents with  . Follow-up    4 week  . Diabetes   Subjective:    HPI      Diabetes Mellitus Type II, Follow-up:   Lab Results  Component Value Date   HGBA1C 9.2* 09/22/2014    Last seen for diabetes 1 months ago.  Management changes included stopping Humalog and starting Invokana due to Labile blood sugarHe reports excellent compliance with treatment. He is not having side effects.   Home blood sugar records: fasting range: 150-160 in the 160s in the evenings.   Episodes of hypoglycemia? no   Weight trend: stable Prior visit with dietician: no Current diet: well balanced Current exercise: none  Pertinent Labs:    Component Value Date/Time   CHOL 156 09/22/2014   TRIG 70 09/22/2014   CREATININE 1.4* 09/22/2014   CREATININE 1.3 08/23/2008 1952    Wt Readings from Last 3 Encounters:  12/28/14 190 lb (86.183 kg)  11/23/14 195 lb (88.451 kg)  09/22/14 193 lb (87.544 kg)    ------------------------------------------------------------------------    No Known Allergies Previous Medications   ASPIRIN EC 81 MG TABLET    Take 81 mg by mouth daily.   CYCLOBENZAPRINE (FLEXERIL) 5 MG TABLET    Take 5 mg by mouth 3 (three) times daily.   DILTIAZEM (TIAZAC) 180 MG 24 HR CAPSULE       DOXYCYCLINE (VIBRA-TABS) 100 MG TABLET       GLUCOSE BLOOD (ACCU-CHEK AVIVA PLUS) TEST STRIP    ACCU-CHEK AVIVA PLUS (In Vitro Strip)  1 (one) Strip Strip three times daily for 90 days  Quantity: 300;  Refills: 3   Ordered :05-Oct-2014  Lelon Huh MD;  Started 05-Oct-2014 Active Comments: Insulin requiring diabetes   HYDROACTIVE DRESSINGS EX       IBUPROFEN (ADVIL,MOTRIN) 800 MG TABLET       LANTUS SOLOSTAR 100 UNIT/ML SOLOSTAR PEN    Inject up to 50 units  subcutaneously as directed  by physician   LOSARTAN  (COZAAR) 100 MG TABLET    Take 1 tablet (100 mg total) by mouth daily.   METFORMIN (GLUCOPHAGE) 1000 MG TABLET    Take 1 tablet (1,000 mg total) by mouth daily with breakfast.   RIVAROXABAN (XARELTO) 20 MG TABS TABLET    Take 20 mg by mouth daily with supper.   TADALAFIL (CIALIS) 20 MG TABLET    Take 20 mg by mouth daily as needed.    Review of Systems  Cardiovascular: Negative for chest pain and palpitations.  Neurological: Negative for dizziness, light-headedness and headaches.    Social History  Substance Use Topics  . Smoking status: Never Smoker   . Smokeless tobacco: Never Used  . Alcohol Use: No   Objective:   BP 122/70 mmHg  Pulse 71  Temp(Src) 98.3 F (36.8 C) (Oral)  Resp 16  Wt 190 lb (86.183 kg)  SpO2 97%  Physical Exam   General Appearance:    Alert, cooperative, no distress  Eyes:    PERRL, conjunctiva/corneas clear, EOM's intact       Lungs:     Clear to auscultation bilaterally, respirations unlabored  Heart:    Regular rate and rhythm  Neurologic:   Awake, alert, oriented x 3. No apparent focal neurological  defect.           Assessment & Plan:     1. Diabetes mellitus type 2, uncontrolled Sugars much less labile since starting Invokan, A1c slightly improved over the last month. Continue current medications.  Recheck A1c in about 2 months.  - Lipid panel - Renal function panel - canagliflozin (INVOKANA) 100 MG TABS tablet; Take 1 tablet (100 mg total) by mouth daily.  Dispense: 30 tablet; Refill: 6  2. Essential hypertension well controlled Continue current medications.         Lelon Huh, MD  [facility]

## 2015-01-01 ENCOUNTER — Telehealth: Payer: Self-pay

## 2015-01-01 DIAGNOSIS — E7800 Pure hypercholesterolemia, unspecified: Secondary | ICD-10-CM

## 2015-01-01 LAB — LIPID PANEL
CHOLESTEROL TOTAL: 174 mg/dL (ref 100–199)
Chol/HDL Ratio: 2.6 ratio units (ref 0.0–5.0)
HDL: 67 mg/dL (ref >39)
LDL CALC: 94 mg/dL (ref 0–99)
TRIGLYCERIDES: 66 mg/dL (ref 0–149)
VLDL CHOLESTEROL CAL: 13 mg/dL (ref 5–40)

## 2015-01-01 LAB — RENAL FUNCTION PANEL
ALBUMIN: 4.6 g/dL (ref 3.5–5.5)
BUN / CREAT RATIO: 10 (ref 9–20)
BUN: 12 mg/dL (ref 6–24)
CO2: 24 mmol/L (ref 18–29)
CREATININE: 1.26 mg/dL (ref 0.76–1.27)
Calcium: 10.1 mg/dL (ref 8.7–10.2)
Chloride: 101 mmol/L (ref 97–108)
GFR calc non Af Amer: 65 mL/min/{1.73_m2} (ref >59)
GFR, EST AFRICAN AMERICAN: 75 mL/min/{1.73_m2} (ref >59)
Glucose: 191 mg/dL — ABNORMAL HIGH (ref 65–99)
Phosphorus: 4.1 mg/dL (ref 2.5–4.5)
Potassium: 4.5 mmol/L (ref 3.5–5.2)
Sodium: 141 mmol/L (ref 134–144)

## 2015-01-01 MED ORDER — PRAVASTATIN SODIUM 40 MG PO TABS: 40.0000 mg | ORAL_TABLET | Freq: Every day | ORAL | Status: DC

## 2015-01-01 NOTE — Telephone Encounter (Signed)
Advised pt as directed below. Pt verbalized fully understanding.   Thanks,  Prescription sent to pharmacy as ordered per MD's order.

## 2015-01-01 NOTE — Telephone Encounter (Signed)
-----   Message from Birdie Sons, MD sent at 01/01/2015  3:05 PM EDT ----- LDL cholesterol is 94, needs to be <70 for diabetics. Please start pravastatin 40mg  one tablet daily, #30, rf x 3. Follow up chol and diabetes in 3 months.

## 2015-01-08 ENCOUNTER — Other Ambulatory Visit: Payer: Self-pay | Admitting: Family Medicine

## 2015-01-30 ENCOUNTER — Other Ambulatory Visit: Payer: Self-pay | Admitting: Family Medicine

## 2015-02-01 ENCOUNTER — Telehealth: Payer: Self-pay | Admitting: Family Medicine

## 2015-02-01 NOTE — Telephone Encounter (Signed)
Please advise 

## 2015-02-01 NOTE — Telephone Encounter (Signed)
Pt called saying he has some cough and congestion, no fever.  He just got in yesterday from [place], [place] and it started while he was therer.  [facility] [place] and wants to know if you could call in something for him.   His call back is

## 2015-02-01 NOTE — Telephone Encounter (Signed)
Last ov was on 12/29/2014.   Thanks,

## 2015-02-03 NOTE — Telephone Encounter (Signed)
Pt called back because he hasn't heard anything about asking for cough medication. Because the refill request from 01/30/15 was refused I advised pt needs an appointment. Pt is scheduled for 02/04/15. Thanks TNP

## 2015-02-04 ENCOUNTER — Encounter: Payer: Self-pay | Admitting: Family Medicine

## 2015-02-04 ENCOUNTER — Ambulatory Visit (INDEPENDENT_AMBULATORY_CARE_PROVIDER_SITE_OTHER): Payer: 59 | Admitting: Family Medicine

## 2015-02-04 VITALS — BP 130/70 | HR 79 | Temp 98.6°F | Resp 16 | Ht 65.0 in | Wt 186.0 lb

## 2015-02-04 DIAGNOSIS — J069 Acute upper respiratory infection, unspecified: Secondary | ICD-10-CM

## 2015-02-04 DIAGNOSIS — J309 Allergic rhinitis, unspecified: Secondary | ICD-10-CM

## 2015-02-04 DIAGNOSIS — R059 Cough, unspecified: Secondary | ICD-10-CM

## 2015-02-04 DIAGNOSIS — R05 Cough: Secondary | ICD-10-CM | POA: Diagnosis not present

## 2015-02-04 MED ORDER — BENZONATATE 100 MG PO CAPS: 100.0000 mg | ORAL_CAPSULE | Freq: Two times a day (BID) | ORAL | Status: DC | PRN

## 2015-02-04 MED ORDER — GUAIFENESIN-CODEINE 100-10 MG/5ML PO SOLN: 5.0000 mL | Freq: Every evening | ORAL | Status: DC | PRN

## 2015-02-04 NOTE — Progress Notes (Signed)
Patient: Robert [name] Male    DOB: [birth year]   54 y.o.   MRN: [id number] Visit Date: 02/04/2015  Today's Ashanty Coltrane: Lelon Huh, MD   Chief Complaint  Patient presents with  . Cough   Subjective:    Cough This is a new problem. Episode onset: 8 days. The problem has been gradually improving. The cough is productive of sputum (clear colored). Associated symptoms include ear congestion, nasal congestion, rhinorrhea and a sore throat. Pertinent negatives include no chest pain, chills, ear pain, fever, headaches, heartburn, hemoptysis, myalgias, postnasal drip, rash, shortness of breath, sweats, weight loss or wheezing. The symptoms are aggravated by lying down and cold air (warm air). He has tried OTC cough suppressant for the symptoms. The treatment provided mild relief.  Tried Nyquil and Dayquil with minimal relief. Cough is keeping him awake at night. On no sinus or allergy medications. Having some congestion, but drainage has stopped. He has taking codeine cough syrups in the past which worked well.      No Known Allergies Previous Medications   ASPIRIN EC 81 MG TABLET    Take 81 mg by mouth daily.   CANAGLIFLOZIN (INVOKANA) 100 MG TABS TABLET    Take 1 tablet (100 mg total) by mouth daily.   CYCLOBENZAPRINE (FLEXERIL) 5 MG TABLET    Take 5 mg by mouth 3 (three) times daily.   DILTIAZEM (TIAZAC) 180 MG 24 HR CAPSULE       DOXYCYCLINE (VIBRA-TABS) 100 MG TABLET       GLUCOSE BLOOD (ACCU-CHEK AVIVA PLUS) TEST STRIP    ACCU-CHEK AVIVA PLUS (In Vitro Strip)  1 (one) Strip Strip three times daily for 90 days  Quantity: 300;  Refills: 3   Ordered :05-Oct-2014  Lelon Huh MD;  Started 05-Oct-2014 Active Comments: Insulin requiring diabetes   HYDROACTIVE DRESSINGS EX       IBUPROFEN (ADVIL,MOTRIN) 800 MG TABLET    Take 1 tablet by mouth  every eight hours as needed   LANTUS SOLOSTAR 100 UNIT/ML SOLOSTAR PEN    Inject up to 50 units  subcutaneously as directed  by physician     LOSARTAN (COZAAR) 100 MG TABLET    Take 1 tablet (100 mg total) by mouth daily.   METFORMIN (GLUCOPHAGE) 1000 MG TABLET    Take 1 tablet (1,000 mg total) by mouth daily with breakfast.   PRAVASTATIN (PRAVACHOL) 40 MG TABLET    Take 1 tablet (40 mg total) by mouth daily.   RIVAROXABAN (XARELTO) 20 MG TABS TABLET    Take 20 mg by mouth daily with supper.   TADALAFIL (CIALIS) 20 MG TABLET    Take 20 mg by mouth daily as needed.    Review of Systems  Constitutional: Negative for fever, chills, weight loss and appetite change.  HENT: Positive for rhinorrhea and sore throat. Negative for ear pain and postnasal drip.   Respiratory: Positive for cough. Negative for hemoptysis, chest tightness, shortness of breath and wheezing.   Cardiovascular: Negative for chest pain and palpitations.  Gastrointestinal: Negative for heartburn, nausea, vomiting and abdominal pain.  Musculoskeletal: Negative for myalgias.  Skin: Negative for rash.  Neurological: Negative for headaches.    Social History  Substance Use Topics  . Smoking status: Never Smoker   . Smokeless tobacco: Never Used  . Alcohol Use: No   Objective:   BP 130/70 mmHg  Pulse 79  Temp(Src) 98.6 F (37 C) (Oral)  Resp 16  Ht 5'  5" (1.651 m)  Wt 186 lb (84.369 kg)  BMI 30.95 kg/m2  SpO2 97%  Physical Exam  General Appearance:    Alert, cooperative, no distress  HENT:   bilateral TM normal without fluid or infection, neck without nodes, sinuses nontender, post nasal drip noted and nasal mucosa pale and congested  Eyes:    PERRL, conjunctiva/corneas clear, EOM's intact       Lungs:     Clear to auscultation bilaterally, respirations unlabored  Heart:    Regular rate and rhythm  Neurologic:   Awake, alert, oriented x 3. No apparent focal neurological           defect.           Assessment & Plan:     1. Cough   - benzonatate (TESSALON) 100 MG capsule; Take 1 capsule (100 mg total) by mouth 2 (two) times daily as needed for  cough.  Dispense: 20 capsule; Refill: 0 - guaiFENesin-codeine 100-10 MG/5ML syrup; Take 5-10 mLs by mouth at bedtime as needed for cough.  Dispense: 120 mL; Refill: 0  2. Upper respiratory infection   3. Allergic rhinitis, unspecified allergic rhinitis type Recommend OTC Claritin or Allegra daily which would likely help cough.        Lelon Huh, MD  [facility]

## 2015-02-04 NOTE — Patient Instructions (Signed)
Recommend OTC Claritin or Allegra once a day

## 2015-02-17 ENCOUNTER — Other Ambulatory Visit: Payer: Self-pay | Admitting: Family Medicine

## 2015-02-17 MED ORDER — DOXYCYCLINE HYCLATE 100 MG PO CAPS: 100.0000 mg | ORAL_CAPSULE | Freq: Two times a day (BID) | ORAL | Status: DC

## 2015-02-17 MED ORDER — MONTELUKAST SODIUM 10 MG PO TABS: 10.0000 mg | ORAL_TABLET | Freq: Every day | ORAL | Status: DC

## 2015-02-17 NOTE — Telephone Encounter (Signed)
Pt is requesting a different Rx for a cough.  Pt states he is coughing all during the day.  [facility]  VE#[phone]

## 2015-02-17 NOTE — Telephone Encounter (Signed)
Need to go ahead and start antibiotic, doxycycline 100mg  twice daily for 10 days  Can try Singulair 10mg  daily #20 for cough Any other cough medications will cause drowsiness.

## 2015-02-17 NOTE — Telephone Encounter (Signed)
Left vm notifying pt. Rx's sent to [place].

## 2015-02-26 ENCOUNTER — Other Ambulatory Visit: Payer: Self-pay | Admitting: Family Medicine

## 2015-02-27 ENCOUNTER — Other Ambulatory Visit: Payer: Self-pay | Admitting: Family Medicine

## 2015-03-09 ENCOUNTER — Other Ambulatory Visit: Payer: Self-pay | Admitting: *Deleted

## 2015-03-09 DIAGNOSIS — E7800 Pure hypercholesterolemia, unspecified: Secondary | ICD-10-CM

## 2015-03-09 MED ORDER — PRAVASTATIN SODIUM 40 MG PO TABS: 40.0000 mg | ORAL_TABLET | Freq: Every day | ORAL | Status: DC

## 2015-03-09 MED ORDER — CANAGLIFLOZIN 100 MG PO TABS: 100.0000 mg | ORAL_TABLET | Freq: Every day | ORAL | Status: DC

## 2015-03-09 NOTE — Telephone Encounter (Signed)
Wanting rx's resent to [facility].

## 2015-03-10 ENCOUNTER — Other Ambulatory Visit: Payer: Self-pay | Admitting: Family Medicine

## 2015-03-10 MED ORDER — IBUPROFEN 800 MG PO TABS: 800.0000 mg | ORAL_TABLET | Freq: Three times a day (TID) | ORAL | Status: DC | PRN

## 2015-03-14 ENCOUNTER — Other Ambulatory Visit: Payer: Self-pay | Admitting: Family Medicine

## 2015-04-26 ENCOUNTER — Other Ambulatory Visit: Payer: Self-pay | Admitting: Family Medicine

## 2015-04-26 NOTE — Telephone Encounter (Signed)
Pt would like refill on losartan (COZAAR) 100 MG tablet

## 2015-04-26 NOTE — Telephone Encounter (Signed)
LMOVM for pt to return call. Rx for losartan was sent to [facility] 02/26/2015 for year supply. Need to know if pt wants rx sent to a different pharmacy?

## 2015-04-29 NOTE — Telephone Encounter (Signed)
Patient stated that [facility] did not received prescription for losartan that was sent 02/26/2015. Requesting rx's be sent to [facility].

## 2015-04-30 MED ORDER — MONTELUKAST SODIUM 10 MG PO TABS: ORAL_TABLET | ORAL | Status: DC

## 2015-04-30 MED ORDER — LOSARTAN POTASSIUM 100 MG PO TABS: 50.0000 mg | ORAL_TABLET | Freq: Every day | ORAL | Status: DC

## 2015-05-29 ENCOUNTER — Other Ambulatory Visit: Payer: Self-pay | Admitting: Family Medicine

## 2015-05-31 ENCOUNTER — Other Ambulatory Visit: Payer: Self-pay | Admitting: *Deleted

## 2015-05-31 MED ORDER — DILTIAZEM HCL ER BEADS 180 MG PO CP24: 180.0000 mg | ORAL_CAPSULE | Freq: Every day | ORAL | Status: DC

## 2015-07-08 LAB — HM DIABETES EYE EXAM

## 2015-07-20 ENCOUNTER — Telehealth: Payer: Self-pay | Admitting: Family Medicine

## 2015-07-20 ENCOUNTER — Encounter: Payer: Self-pay | Admitting: Family Medicine

## 2015-07-20 DIAGNOSIS — E1165 Type 2 diabetes mellitus with hyperglycemia: Principal | ICD-10-CM

## 2015-07-20 DIAGNOSIS — E11319 Type 2 diabetes mellitus with unspecified diabetic retinopathy without macular edema: Secondary | ICD-10-CM

## 2015-07-20 DIAGNOSIS — Z794 Long term (current) use of insulin: Principal | ICD-10-CM

## 2015-07-20 NOTE — Telephone Encounter (Signed)
Called pt back for more info. Patient stated that he has had dry cough for 2 weeks. No fever, sore throat, and no congestion. Patient said the cough non-productive and is pretty persistent. Informed patient that he may need an ov but pt wanted me to send message to Dr. Caryn Section instead. Please advise?

## 2015-07-20 NOTE — Telephone Encounter (Signed)
Patient was notified. Patient scheduled an ov for 07/23/15 for follow-up diabetes and for cough.

## 2015-07-20 NOTE — Telephone Encounter (Signed)
Pt has had a cough for 2 weeks and is requesting a Rx to help with his cough.  [facility].  [id number]:[phone]

## 2015-07-20 NOTE — Telephone Encounter (Signed)
Can try OTC Delsym or Mucinex, if these are not effective, or if any fever, chest pain or shortness of breath then he needs office visit.

## 2015-07-23 ENCOUNTER — Encounter: Payer: Self-pay | Admitting: Family Medicine

## 2015-07-23 ENCOUNTER — Ambulatory Visit (INDEPENDENT_AMBULATORY_CARE_PROVIDER_SITE_OTHER): Payer: 59 | Admitting: Family Medicine

## 2015-07-23 VITALS — BP 110/70 | HR 97 | Temp 98.7°F | Resp 16 | Ht 65.0 in | Wt 183.0 lb

## 2015-07-23 DIAGNOSIS — E1165 Type 2 diabetes mellitus with hyperglycemia: Secondary | ICD-10-CM | POA: Diagnosis not present

## 2015-07-23 DIAGNOSIS — R059 Cough, unspecified: Secondary | ICD-10-CM

## 2015-07-23 DIAGNOSIS — E11319 Type 2 diabetes mellitus with unspecified diabetic retinopathy without macular edema: Secondary | ICD-10-CM

## 2015-07-23 DIAGNOSIS — R05 Cough: Secondary | ICD-10-CM

## 2015-07-23 DIAGNOSIS — Z794 Long term (current) use of insulin: Secondary | ICD-10-CM | POA: Diagnosis not present

## 2015-07-23 LAB — POCT GLYCOSYLATED HEMOGLOBIN (HGB A1C)
Est. average glucose Bld gHb Est-mCnc: >326
HEMOGLOBIN A1C: 14

## 2015-07-23 MED ORDER — BENZONATATE 100 MG PO CAPS: 100.0000 mg | ORAL_CAPSULE | Freq: Two times a day (BID) | ORAL | Status: AC | PRN

## 2015-07-23 MED ORDER — CANAGLIFLOZIN 100 MG PO TABS: 300.0000 mg | ORAL_TABLET | Freq: Every day | ORAL | Status: DC

## 2015-07-23 MED ORDER — METFORMIN HCL 1000 MG PO TABS: 1000.0000 mg | ORAL_TABLET | Freq: Every day | ORAL | Status: DC

## 2015-07-23 NOTE — Patient Instructions (Addendum)
Take OTC Zyrtec once a day   Check and write down your blood sugar every morning before you eat. Bring this record to your next appointment

## 2015-07-23 NOTE — Progress Notes (Signed)
Patient: Robert [name] Male    DOB: [birth year]   55 y.o.   MRN: [id number] Visit Date: 07/23/2015  Today's Provider: Lelon Huh, MD   Chief Complaint  Patient presents with  . Diabetes  . Follow-up  . Cough   Subjective:        Diabetes Mellitus Type II, Follow-up:   Lab Results  Component Value Date   HGBA1C 9.2* 09/22/2014   Last seen for diabetes 7 months ago.  Management since then includes; no changes. He reports good compliance with treatment. He is not having side effects. none Current symptoms include none and have been n/a. Home blood sugar records: 105 States he only checks it once week.  He did stop taking metformin several months ago.    Episodes of hypoglycemia? no   Current Insulin Regimen: Lantus 40 every morning. Humalog if BS over 200.  Most Recent Eye Exam: 04/2015 Weight trend: stable Prior visit with dietician: no Current diet: well balanced Current exercise: walking  ----------------------------------------------------------------------   Hypertension, follow-up:  BP Readings from Last 3 Encounters:  07/23/15 110/70  02/04/15 130/70  12/28/14 122/70    He was last seen for hypertension 7 months ago.  BP at that visit was 122/70. Management since that visit includes; no changes.He reports fair compliance with treatment. He is not having side effects. none  He is exercising. He is not adherent to low salt diet.   Outside blood pressures are elevated. He is experiencing none.  Patient denies none.   Cardiovascular risk factors include diabetes mellitus.  Use of agents associated with hypertension: none.   ----------------------------------------------------------------------     Lipid/Cholesterol, Follow-up:   Last seen for this 7 months ago.  Management since that visit includes; started pravastatin 40 mg qd.  Last Lipid Panel:    Component Value Date/Time   CHOL 174 12/31/2014 0943   CHOL 156 09/22/2014   TRIG 66 12/31/2014 0943   HDL 67 12/31/2014 0943   HDL 67 09/22/2014   CHOLHDL 2.6 12/31/2014 0943   LDLCALC 94 12/31/2014 0943   LDLCALC 75 09/22/2014    He reports good compliance with treatment. He is not having side effects. none  Wt Readings from Last 3 Encounters:  07/23/15 183 lb (83.008 kg)  02/04/15 186 lb (84.369 kg)  12/28/14 190 lb (86.183 kg)    ----------------------------------------------------------------------     Cough This is a recurrent problem. The current episode started 1 to 4 weeks ago. The problem has been unchanged (3 weeks). The problem occurs every few minutes. The cough is productive of sputum. Associated symptoms include wheezing. Pertinent negatives include no chest pain, chills, ear congestion, ear pain, fever, headaches, heartburn, hemoptysis, myalgias, nasal congestion, postnasal drip, rash, rhinorrhea, sore throat, shortness of breath or sweats. The symptoms are aggravated by exercise and lying down. Treatments tried: nightquil. The treatment provided mild relief. His past medical history is significant for pneumonia. There is no history of asthma or bronchitis.   Cough for 3 weeks, constant, a little production. Some wheezing. No fever, no congestion, and no sob.   No Known Allergies Previous Medications   ASPIRIN EC 81 MG TABLET    Take 81 mg by mouth daily.   CANAGLIFLOZIN (INVOKANA) 100 MG TABS TABLET    Take 1 tablet (100 mg total) by mouth daily.   DILTIAZEM (TIAZAC) 180 MG 24 HR CAPSULE    Take 1 capsule (180 mg total) by mouth daily.   GLUCOSE BLOOD (  ACCU-CHEK AVIVA PLUS) TEST STRIP    ACCU-CHEK AVIVA PLUS (In Vitro Strip)  1 (one) Strip Strip three times daily for 90 days  Quantity: 300;  Refills: 3   Ordered :05-Oct-2014  Lelon Huh MD;  Started 05-Oct-2014 Active Comments: Insulin requiring diabetes   HUMALOG KWIKPEN 100 UNIT/ML KIWKPEN    Inject subcutaneously up to 10 units before meals as  needed   IBUPROFEN (ADVIL,MOTRIN)  800 MG TABLET    Take 1 tablet (800 mg total) by mouth every 8 (eight) hours as needed.   LANTUS SOLOSTAR 100 UNIT/ML SOLOSTAR PEN    Inject up to 50 units  subcutaneously as directed  by physician   LOSARTAN (COZAAR) 100 MG TABLET    Take 0.5 tablets (50 mg total) by mouth daily.   METFORMIN (GLUCOPHAGE) 1000 MG TABLET    Take 1 tablet (1,000 mg total) by mouth daily with breakfast.   MONTELUKAST (SINGULAIR) 10 MG TABLET    TAKE 1 TABLET (10 MG TOTAL) BY MOUTH AT BEDTIME.   PRAVASTATIN (PRAVACHOL) 40 MG TABLET    Take 1 tablet (40 mg total) by mouth daily.   TADALAFIL (CIALIS) 20 MG TABLET    Take 20 mg by mouth daily as needed.    Review of Systems  Constitutional: Negative for fever, chills and appetite change.  HENT: Negative for ear pain, postnasal drip, rhinorrhea and sore throat.   Respiratory: Positive for cough and wheezing. Negative for hemoptysis, chest tightness and shortness of breath.   Cardiovascular: Negative for chest pain and palpitations.  Gastrointestinal: Negative for heartburn, nausea, vomiting and abdominal pain.  Musculoskeletal: Negative for myalgias.  Skin: Negative for rash.  Neurological: Negative for headaches.    Social History  Substance Use Topics  . Smoking status: Never Smoker   . Smokeless tobacco: Never Used  . Alcohol Use: No   Objective:   BP 110/70 mmHg  Pulse 97  Temp(Src) 98.7 F (37.1 C) (Oral)  Resp 16  Ht 5\' 5"  (1.651 m)  Wt 183 lb (83.008 kg)  BMI 30.45 kg/m2  SpO2 95%  Physical Exam  General Appearance:    Alert, cooperative, no distress  HENT:   bilateral TM normal without fluid or infection, neck without nodes, sinuses nontender, post nasal drip noted and nasal mucosa pale and congested  Eyes:    PERRL, conjunctiva/corneas clear, EOM's intact       Lungs:     Clear to auscultation bilaterally, respirations unlabored  Heart:    Regular rate and rhythm  Neurologic:   Awake, alert, oriented x 3. No apparent focal neurological            defect.       Results for orders placed or performed in visit on 07/23/15  POCT glycosylated hemoglobin (Hb A1C)  Result Value Ref Range   Hemoglobin A1C 14.0    Est. average glucose Bld gHb Est-mCnc >326         Assessment & Plan:     1. Uncontrolled type 2 diabetes mellitus with retinopathy, with long-term current use of insulin, macular edema presence unspecified, unspecified retinopathy severity ([place]) He is to start checking blood sugars every day and keep a record He is to start back on metformin which he stopped taking when Invokana was prescribed He just sent for a refill of Invokana 100mg  from his mail order pharmacy, so he is to Increase Invokana 100 to 3 tablets a day.   - POCT glycosylated hemoglobin (Hb A1C) -  metFORMIN (GLUCOPHAGE) 1000 MG tablet; Take 1 tablet (1,000 mg total) by mouth daily with breakfast.  Dispense: 90 tablet; Refill: 1 - canagliflozin (INVOKANA) 100 MG TABS tablet; Take 3 tablets (300 mg total) by mouth daily.  Dispense: 1 tablet; Refill: 1  He is to follow up in 4 weeks to review blood sugar diary.   2. Cough Likely secondary to post nasal drainage. Start Zyrtec once a day and prn Tessalon  - benzonatate (TESSALON) 100 MG capsule; Take 1 capsule (100 mg total) by mouth 2 (two) times daily as needed for cough.  Dispense: 30 capsule; Refill: 0  Return in about 4 weeks (around 08/20/2015).       Lelon Huh, MD  [facility]

## 2015-07-27 ENCOUNTER — Telehealth: Payer: Self-pay | Admitting: Family Medicine

## 2015-07-27 MED ORDER — AZITHROMYCIN 250 MG PO TABS: ORAL_TABLET | ORAL | Status: AC

## 2015-07-27 NOTE — Telephone Encounter (Signed)
Can try azithromycin, have sent rx to [facility]. If Not better when finished will need chest XR.

## 2015-07-27 NOTE — Telephone Encounter (Addendum)
Patient was notified. Expressed understanding.  

## 2015-07-27 NOTE — Telephone Encounter (Signed)
Pt was in last week but called back saying his cough is not any better, may be worse.  Does he need to come in or can you call somehting else in.  May be wheezing a little,  [facility].  His call back is [phone]  Thanks, Con Memos

## 2015-07-27 NOTE — Telephone Encounter (Signed)
Pt called wanting what was decided.

## 2015-07-28 ENCOUNTER — Telehealth: Payer: Self-pay | Admitting: Family Medicine

## 2015-07-28 MED ORDER — GUAIFENESIN-CODEINE 100-10 MG/5ML PO SOLN: 10.0000 mL | Freq: Four times a day (QID) | ORAL | Status: DC | PRN

## 2015-07-28 NOTE — Telephone Encounter (Signed)
Pt's wife would like to see if Dr. Caryn Section would be willing to call in cough [facility] [place].  Please contact the patients/patients wife and let her know if it is called in.

## 2015-07-28 NOTE — Telephone Encounter (Signed)
Can call in guaifenesin-codeine prescription. Thanks.

## 2015-07-28 NOTE — Telephone Encounter (Signed)
Please advise 

## 2015-07-29 NOTE — Telephone Encounter (Signed)
Patient was notified.

## 2015-07-29 NOTE — Telephone Encounter (Signed)
Rx called in to pharmacy. 

## 2015-07-30 ENCOUNTER — Encounter: Payer: Self-pay | Admitting: Emergency Medicine

## 2015-07-30 ENCOUNTER — Emergency Department: Payer: 59

## 2015-07-30 ENCOUNTER — Emergency Department
Admission: EM | Admit: 2015-07-30 | Discharge: 2015-07-30 | Disposition: A | Discharge status: Home / Self Care | Payer: 59 | Attending: Emergency Medicine | Admitting: Emergency Medicine

## 2015-07-30 DIAGNOSIS — Z7984 Long term (current) use of oral hypoglycemic drugs: Secondary | ICD-10-CM | POA: Diagnosis not present

## 2015-07-30 DIAGNOSIS — E871 Hypo-osmolality and hyponatremia: Secondary | ICD-10-CM | POA: Diagnosis not present

## 2015-07-30 DIAGNOSIS — E11319 Type 2 diabetes mellitus with unspecified diabetic retinopathy without macular edema: Secondary | ICD-10-CM | POA: Insufficient documentation

## 2015-07-30 DIAGNOSIS — Z794 Long term (current) use of insulin: Secondary | ICD-10-CM | POA: Diagnosis not present

## 2015-07-30 DIAGNOSIS — I10 Essential (primary) hypertension: Secondary | ICD-10-CM | POA: Insufficient documentation

## 2015-07-30 DIAGNOSIS — Z79899 Other long term (current) drug therapy: Secondary | ICD-10-CM | POA: Diagnosis not present

## 2015-07-30 DIAGNOSIS — E860 Dehydration: Secondary | ICD-10-CM | POA: Diagnosis not present

## 2015-07-30 DIAGNOSIS — E1165 Type 2 diabetes mellitus with hyperglycemia: Secondary | ICD-10-CM | POA: Diagnosis not present

## 2015-07-30 DIAGNOSIS — R1013 Epigastric pain: Secondary | ICD-10-CM

## 2015-07-30 DIAGNOSIS — R109 Unspecified abdominal pain: Secondary | ICD-10-CM | POA: Diagnosis present

## 2015-07-30 DIAGNOSIS — Z7982 Long term (current) use of aspirin: Secondary | ICD-10-CM | POA: Insufficient documentation

## 2015-07-30 HISTORY — DX: Type 2 diabetes mellitus without complications: E11.9

## 2015-07-30 HISTORY — DX: Essential (primary) hypertension: I10

## 2015-07-30 LAB — COMPREHENSIVE METABOLIC PANEL
ALBUMIN: 4.6 g/dL (ref 3.5–5.0)
ALT: 37 U/L (ref 17–63)
ANION GAP: 12 (ref 5–15)
AST: 35 U/L (ref 15–41)
Alkaline Phosphatase: 80 U/L (ref 38–126)
BILIRUBIN TOTAL: 0.6 mg/dL (ref 0.3–1.2)
BUN: 35 mg/dL — ABNORMAL HIGH (ref 6–20)
CO2: 19 mmol/L — ABNORMAL LOW (ref 22–32)
Calcium: 9.2 mg/dL (ref 8.9–10.3)
Chloride: 98 mmol/L — ABNORMAL LOW (ref 101–111)
Creatinine, Ser: 1.58 mg/dL — ABNORMAL HIGH (ref 0.61–1.24)
GFR, EST AFRICAN AMERICAN: 56 mL/min — AB (ref >60)
GFR, EST NON AFRICAN AMERICAN: 48 mL/min — AB (ref >60)
Glucose, Bld: 381 mg/dL — ABNORMAL HIGH (ref 65–99)
POTASSIUM: 4.1 mmol/L (ref 3.5–5.1)
Sodium: 129 mmol/L — ABNORMAL LOW (ref 135–145)
TOTAL PROTEIN: 8.1 g/dL (ref 6.5–8.1)

## 2015-07-30 LAB — GLUCOSE, CAPILLARY: Glucose-Capillary: 298 mg/dL — ABNORMAL HIGH (ref 65–99)

## 2015-07-30 LAB — CBC
HEMATOCRIT: 43.5 % (ref 40.0–52.0)
HEMOGLOBIN: 14.3 g/dL (ref 13.0–18.0)
MCH: 26.7 pg (ref 26.0–34.0)
MCHC: 33 g/dL (ref 32.0–36.0)
MCV: 81 fL (ref 80.0–100.0)
Platelets: 250 10*3/uL (ref 150–440)
RBC: 5.36 MIL/uL (ref 4.40–5.90)
RDW: 14.7 % — ABNORMAL HIGH (ref 11.5–14.5)
WBC: 2.2 10*3/uL — AB (ref 3.8–10.6)

## 2015-07-30 LAB — URINALYSIS COMPLETE WITH MICROSCOPIC (ARMC ONLY)
BACTERIA UA: NONE SEEN
BILIRUBIN URINE: NEGATIVE
Glucose, UA: >500 mg/dL — AB
Hgb urine dipstick: NEGATIVE
LEUKOCYTES UA: NEGATIVE
NITRITE: NEGATIVE
PH: 5 (ref 5.0–8.0)
PROTEIN: NEGATIVE mg/dL
SPECIFIC GRAVITY, URINE: 1.03 (ref 1.005–1.030)
Squamous Epithelial / LPF: NONE SEEN

## 2015-07-30 LAB — LIPASE, BLOOD: LIPASE: 18 U/L (ref 11–51)

## 2015-07-30 MED ORDER — LORAZEPAM 2 MG/ML IJ SOLN
1.0000 mg | Freq: Once | INTRAMUSCULAR | Status: AC
  Administered 2015-07-30: 1 mg via INTRAVENOUS
  Filled 2015-07-30: qty 1

## 2015-07-30 MED ORDER — FAMOTIDINE 20 MG PO TABS: 20.0000 mg | ORAL_TABLET | Freq: Two times a day (BID) | ORAL | Status: DC

## 2015-07-30 MED ORDER — METOCLOPRAMIDE HCL 10 MG PO TABS: 10.0000 mg | ORAL_TABLET | Freq: Three times a day (TID) | ORAL | Status: DC | PRN

## 2015-07-30 MED ORDER — SODIUM CHLORIDE 0.9 % IV SOLN
Freq: Once | INTRAVENOUS | Status: AC
  Administered 2015-07-30: 15:00:00 via INTRAVENOUS

## 2015-07-30 MED ORDER — FAMOTIDINE IN NACL 20-0.9 MG/50ML-% IV SOLN
20.0000 mg | Freq: Once | INTRAVENOUS | Status: AC
  Administered 2015-07-30: 20 mg via INTRAVENOUS
  Filled 2015-07-30: qty 50

## 2015-07-30 MED ORDER — METOCLOPRAMIDE HCL 5 MG/ML IJ SOLN
10.0000 mg | Freq: Once | INTRAMUSCULAR | Status: AC
  Administered 2015-07-30: 10 mg via INTRAVENOUS
  Filled 2015-07-30: qty 2

## 2015-07-30 NOTE — ED Notes (Signed)
MD Williams at bedside.  

## 2015-07-30 NOTE — Discharge Instructions (Signed)
Abdominal Pain, Adult °Many things can cause abdominal pain. Usually, abdominal pain is not caused by a disease and will improve without treatment. It can often be observed and treated at home. Your health care provider will do a physical exam and possibly order blood tests and X-rays to help determine the seriousness of your pain. However, in many cases, more time must pass before a clear cause of the pain can be found. Before that point, your health care provider may not know if you need more testing or further treatment. °HOME CARE INSTRUCTIONS °Monitor your abdominal pain for any changes. The following actions may help to alleviate any discomfort you are experiencing: °· Only take over-the-counter or prescription medicines as directed by your health care provider. °· Do not take laxatives unless directed to do so by your health care provider. °· Try a clear liquid diet (broth, tea, or water) as directed by your health care provider. Slowly move to a bland diet as tolerated. °SEEK MEDICAL CARE IF: °· You have unexplained abdominal pain. °· You have abdominal pain associated with nausea or diarrhea. °· You have pain when you urinate or have a bowel movement. °· You experience abdominal pain that wakes you in the night. °· You have abdominal pain that is worsened or improved by eating food. °· You have abdominal pain that is worsened with eating fatty foods. °· You have a fever. °SEEK IMMEDIATE MEDICAL CARE IF: °· Your pain does not go away within 2 hours. °· You keep throwing up (vomiting). °· Your pain is felt only in portions of the abdomen, such as the right side or the left lower portion of the abdomen. °· You pass bloody or black tarry stools. °MAKE SURE YOU: °· Understand these instructions. °· Will watch your condition. °· Will get help right away if you are not doing well or get worse. °  °This information is not intended to replace advice given to you by your health care provider. Make sure you discuss  any questions you have with your health care provider. °  °Document Released: 02/08/2005 Document Revised: 01/20/2015 Document Reviewed: 01/08/2013 °Elsevier Interactive Patient Education ©2016 [employer] ° °Hyperglycemia °Hyperglycemia occurs when the glucose (sugar) in your blood is too high. Hyperglycemia can happen for many reasons, but it most often happens to people who do not know they have diabetes or are not managing their diabetes properly.  °CAUSES  °Whether you have diabetes or not, there are other causes of hyperglycemia. Hyperglycemia can occur when you have diabetes, but it can also occur in other situations that you might not be as aware of, such as: °Diabetes °· If you have diabetes and are having problems controlling your blood glucose, hyperglycemia could occur because of some of the following reasons: °¨ Not following your meal plan. °¨ Not taking your diabetes medications or not taking it properly. °¨ Exercising less or doing less activity than you normally do. °¨ Being sick. °Pre-diabetes °· This cannot be ignored. Before people develop Type 2 diabetes, they almost always have "pre-diabetes." This is when your blood glucose levels are higher than normal, but not yet high enough to be diagnosed as diabetes. Research has shown that some long-term damage to the body, especially the heart and circulatory system, may already be occurring during pre-diabetes. If you take action to manage your blood glucose when you have pre-diabetes, you may delay or prevent Type 2 diabetes from developing. °Stress °· If you have diabetes, you may be "diet"   controlled or on oral medications or insulin to control your diabetes. However, you may find that your blood glucose is higher than usual in the hospital whether you have diabetes or not. This is often referred to as "stress hyperglycemia." Stress can elevate your blood glucose. This happens because of hormones put out by the body during times of stress. If  stress has been the cause of your high blood glucose, it can be followed regularly by your caregiver. That way he/she can make sure your hyperglycemia does not continue to get worse or progress to diabetes. °Steroids °· Steroids are medications that act on the infection fighting system (immune system) to block inflammation or infection. One side effect can be a rise in blood glucose. Most people can produce enough extra insulin to allow for this rise, but for those who cannot, steroids make blood glucose levels go even higher. It is not unusual for steroid treatments to "uncover" diabetes that is developing. It is not always possible to determine if the hyperglycemia will go away after the steroids are stopped. A special blood test called an A1c is sometimes done to determine if your blood glucose was elevated before the steroids were started. °SYMPTOMS °· Thirsty. °· Frequent urination. °· Dry mouth. °· Blurred vision. °· Tired or fatigue. °· Weakness. °· Sleepy. °· Tingling in feet or leg. °DIAGNOSIS  °Diagnosis is made by monitoring blood glucose in one or all of the following ways: °· A1c test. This is a chemical found in your blood. °· Fingerstick blood glucose monitoring. °· Laboratory results. °TREATMENT  °First, knowing the cause of the hyperglycemia is important before the hyperglycemia can be treated. Treatment may include, but is not be limited to: °· Education. °· Change or adjustment in medications. °· Change or adjustment in meal plan. °· Treatment for an illness, infection, etc. °· More frequent blood glucose monitoring. °· Change in exercise plan. °· Decreasing or stopping steroids. °· Lifestyle changes. °HOME CARE INSTRUCTIONS  °· Test your blood glucose as directed. °· Exercise regularly. Your caregiver will give you instructions about exercise. Pre-diabetes or diabetes which comes on with stress is helped by exercising. °· Eat wholesome, balanced meals. Eat often and at regular, fixed times. Your  caregiver or nutritionist will give you a meal plan to guide your sugar intake. °· Being at an ideal weight is important. If needed, losing as little as 10 to 15 pounds may help improve blood glucose levels. °SEEK MEDICAL CARE IF:  °· You have questions about medicine, activity, or diet. °· You continue to have symptoms (problems such as increased thirst, urination, or weight gain). °SEEK IMMEDIATE MEDICAL CARE IF:  °· You are vomiting or have diarrhea. °· Your breath smells fruity. °· You are breathing faster or slower. °· You are very sleepy or incoherent. °· You have numbness, tingling, or pain in your feet or hands. °· You have chest pain. °· Your symptoms get worse even though you have been following your caregiver's orders. °· If you have any other questions or concerns. °  °This information is not intended to replace advice given to you by your health care provider. Make sure you discuss any questions you have with your health care provider. °  °Document Released: 10/25/2000 Document Revised: 07/24/2011 Document Reviewed: 01/05/2015 °Elsevier Interactive Patient Education ©2016 Elsevier Inc. ° °
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
## 2015-07-30 NOTE — ED Notes (Signed)
Pt c/o upper abdominal pain X 2 weeks.  Has recently had diarrhea but denies nausea and vomiting.  Has also had a cough that his pcp gave him cough medicine for but reports it has not gotten better.  Has not taken temperature.

## 2015-07-30 NOTE — ED Notes (Signed)
Pt verbalized understanding of discharge instructions. NAD at this time. 

## 2015-07-30 NOTE — ED Notes (Signed)
Pt handed this RN his urine specimen, no yellow sticker on cup, white sticker grabbed and placed on cup, lab called and notified that I am first nurse and have no scanning abilities in lobby, dealing with an urgent matter and could not leave the urine without identification. Lab accepted white label.

## 2015-08-16 ENCOUNTER — Telehealth: Payer: Self-pay | Admitting: Family Medicine

## 2015-08-16 MED ORDER — GLUCOSE BLOOD VI STRP: ORAL_STRIP | Status: DC

## 2015-08-16 NOTE — Telephone Encounter (Signed)
Pt's wife contacted office for refill request on the following medications: Lancets to be sent to [facility]. Thanks TNP

## 2015-08-16 NOTE — Telephone Encounter (Signed)
Pt wife Rise Paganini called stating pt has a new glucose meter and is needing test stripes for a One Touch Verio meter.  [facility] mail order.  [id number]

## 2015-08-16 NOTE — Telephone Encounter (Signed)
Please advise refill? 

## 2015-08-19 ENCOUNTER — Encounter: Payer: Self-pay | Admitting: Family Medicine

## 2015-08-20 ENCOUNTER — Ambulatory Visit: Payer: 59 | Admitting: Family Medicine

## 2015-08-31 ENCOUNTER — Other Ambulatory Visit: Payer: Self-pay | Admitting: Family Medicine

## 2015-08-31 MED ORDER — TADALAFIL 20 MG PO TABS: 20.0000 mg | ORAL_TABLET | Freq: Every day | ORAL | Status: DC | PRN

## 2015-08-31 NOTE — Telephone Encounter (Signed)
Pt contacted office for refill request on the following medications: tadalafil (CIALIS) 20 MG tablet [facility] Last written:04/14/14 Last OV:07/23/15 Please advise. Thanks TNP

## 2015-09-27 ENCOUNTER — Other Ambulatory Visit: Payer: Self-pay | Admitting: Family Medicine

## 2015-09-27 NOTE — Telephone Encounter (Signed)
Called [facility] who stated that patient has refills available for both meds listed below. Tried to contact pt

## 2015-09-27 NOTE — Telephone Encounter (Signed)
Pt returned call. He stated that he would have his wife call [facility] again b/c he wasn't sure why he hasn't received the medication. Thanks TNP

## 2015-09-27 NOTE — Telephone Encounter (Signed)
Pt's wife contacted office for refill request on the following medications: 1. losartan (COZAAR) 100 MG tablet  2. diltiazem (TIAZAC) 180 MG 24 hr capsule Pt's wife stated that the mail order hasn't sent these medications to pt and they got the insurance to authorized them to be filled at [facility] for a month supply but they need [place] to send in a RX. Please advise. Thanks TNP

## 2015-09-28 MED ORDER — LOSARTAN POTASSIUM 100 MG PO TABS: 50.0000 mg | ORAL_TABLET | Freq: Every day | ORAL | Status: DC

## 2015-09-28 MED ORDER — DILTIAZEM HCL ER BEADS 180 MG PO CP24
180.0000 mg | ORAL_CAPSULE | Freq: Every day | ORAL | Status: DC
Start: 2015-09-28 — End: 2016-07-06

## 2015-09-28 NOTE — Telephone Encounter (Signed)
Rx's sent to [facility]. Patient's wife was notified.

## 2015-11-12 ENCOUNTER — Other Ambulatory Visit: Payer: Self-pay | Admitting: Family Medicine

## 2016-01-02 ENCOUNTER — Other Ambulatory Visit: Payer: Self-pay | Admitting: Family Medicine

## 2016-01-14 ENCOUNTER — Other Ambulatory Visit: Payer: Self-pay | Admitting: Family Medicine

## 2016-01-18 ENCOUNTER — Other Ambulatory Visit: Payer: Self-pay | Admitting: Family Medicine

## 2016-03-31 ENCOUNTER — Ambulatory Visit (INDEPENDENT_AMBULATORY_CARE_PROVIDER_SITE_OTHER): Payer: 59 | Admitting: Family Medicine

## 2016-03-31 ENCOUNTER — Encounter: Payer: Self-pay | Admitting: Family Medicine

## 2016-03-31 VITALS — BP 150/78 | HR 84 | Temp 97.9°F | Resp 16 | Wt 193.0 lb

## 2016-03-31 DIAGNOSIS — E1165 Type 2 diabetes mellitus with hyperglycemia: Secondary | ICD-10-CM | POA: Diagnosis not present

## 2016-03-31 DIAGNOSIS — Z794 Long term (current) use of insulin: Secondary | ICD-10-CM

## 2016-03-31 DIAGNOSIS — IMO0002 Reserved for concepts with insufficient information to code with codable children: Secondary | ICD-10-CM

## 2016-03-31 DIAGNOSIS — E118 Type 2 diabetes mellitus with unspecified complications: Secondary | ICD-10-CM | POA: Diagnosis not present

## 2016-03-31 DIAGNOSIS — N529 Male erectile dysfunction, unspecified: Secondary | ICD-10-CM | POA: Diagnosis not present

## 2016-03-31 DIAGNOSIS — R05 Cough: Secondary | ICD-10-CM

## 2016-03-31 DIAGNOSIS — Z23 Encounter for immunization: Secondary | ICD-10-CM

## 2016-03-31 DIAGNOSIS — R059 Cough, unspecified: Secondary | ICD-10-CM

## 2016-03-31 LAB — POCT GLYCOSYLATED HEMOGLOBIN (HGB A1C)
Est. average glucose Bld gHb Est-mCnc: 258
Hemoglobin A1C: 10.6

## 2016-03-31 MED ORDER — SILDENAFIL CITRATE 100 MG PO TABS: 50.0000 mg | ORAL_TABLET | Freq: Every day | ORAL | 0 refills | Status: DC | PRN

## 2016-03-31 MED ORDER — INSULIN GLARGINE 100 UNIT/ML SOLOSTAR PEN: 60.0000 [IU] | PEN_INJECTOR | Freq: Every day | SUBCUTANEOUS | 1 refills | Status: DC

## 2016-03-31 MED ORDER — BENZONATATE 100 MG PO CAPS: 100.0000 mg | ORAL_CAPSULE | Freq: Three times a day (TID) | ORAL | 0 refills | Status: DC | PRN

## 2016-03-31 NOTE — Patient Instructions (Signed)
   Increase Lantus Insulin to 60 units each day.

## 2016-03-31 NOTE — Progress Notes (Signed)
Patient: Robert [name] Male    DOB: 1960/12/01   55 y.o.   MRN: [id number] Visit Date: 03/31/2016  Today's Provider: Lelon Huh, MD   Chief Complaint  Patient presents with  . Diabetes    follow up   Subjective:    HPI  Diabetes Mellitus Type II, Follow-up:   Lab Results  Component Value Date   HGBA1C 14.0 07/23/2015   HGBA1C 9.2 (A) 09/22/2014    Last seen for diabetes 8 months ago.  Management since then includes restarting Metformin and increasing Invokana 100mg 's to 3 tablets daily. Patient was to have followed up 4 weeks after making these changes.Marland Kitchen He reports poor compliance with treatment. Patient reports that he has not taken Invokana since he was last seen because hit was making him thirsty and having to urinate all the time.  He is not having side effects.  Current symptoms include none and have been stable. Home blood sugar records: rarely checked  Episodes of hypoglycemia? no   Current Insulin Regimen: Humalog 50 units daily Most Recent Eye Exam: <1 year ago Weight trend: increasing steadily Prior visit with dietician: no Current diet: in general, an "unhealthy" diet Current exercise: none  Pertinent Labs:    Component Value Date/Time   CHOL 174 12/31/2014 0943   TRIG 66 12/31/2014 0943   HDL 67 12/31/2014 0943   LDLCALC 94 12/31/2014 0943   CREATININE 1.58 (H) 07/30/2015 1136    Wt Readings from Last 3 Encounters:  07/30/15 190 lb (86.2 kg)  07/23/15 183 lb (83 kg)  02/04/15 186 lb (84.4 kg)    ------------------------------------------------------------------------ Cough: Patient come in reporting non productive cough for 1 week. Patient denies any chills, fever, sweats, headaches, sore throat, body aches, runny nose or sneezing. Patient has tried taking Robitussin which has helped improve the cough.  Taken OTC Robitussin which helps.   ED He requests changing Cialis since it is not as effective as it used to be. Iss interested in  trying Viagra.     No Known Allergies   Current Outpatient Prescriptions:  .  aspirin EC 81 MG tablet, Take 81 mg by mouth daily., Disp: , Rfl:   .  diltiazem (TIAZAC) 180 MG 24 hr capsule, Take 1 capsule (180 mg total) by mouth daily., Disp: 30 capsule, Rfl: 1 .  glucose blood (ONETOUCH VERIO) test strip, 1 strip to check blood sugar 3 times daily. DX: E11.65 Uncontrolled insulin dependent diabetes., Disp: 300 each, Rfl: 4 .  HUMALOG KWIKPEN 100 UNIT/ML KiwkPen, Inject subcutaneously up to 10 units before meals as  needed, Disp: 30 mL, Rfl: 5 .  ibuprofen (ADVIL,MOTRIN) 800 MG tablet, Take 1 tablet by mouth  every 8 hours as needed, Disp: 270 tablet, Rfl: 1 .  LANTUS SOLOSTAR 100 UNIT/ML Solostar Pen, Inject up to 50 units  subcutaneously as directed  by physician, Disp: 45 mL, Rfl: 3 .  losartan (COZAAR) 100 MG tablet, Take 0.5 tablets (50 mg total) by mouth daily., Disp: 30 tablet, Rfl: 1 .  metFORMIN (GLUCOPHAGE) 1000 MG tablet, TAKE 1 TABLET BY MOUTH  DAILY, Disp: 30 tablet, Rfl: 12 .  metoCLOPramide (REGLAN) 10 MG tablet, Take 1 tablet (10 mg total) by mouth every 8 (eight) hours as needed for nausea or vomiting., Disp: 30 tablet, Rfl: 1 .  montelukast (SINGULAIR) 10 MG tablet, TAKE 1 TABLET (10 MG TOTAL) BY MOUTH AT BEDTIME. (Patient not taking: Reported on 03/31/2016), Disp: 90 tablet, Rfl: 2 .  pravastatin (PRAVACHOL) 40 MG tablet, Take 1 tablet (40 mg total) by mouth daily., Disp: 30 tablet, Rfl: 12  .  tadalafil (CIALIS) 20 MG tablet, Take 1 tablet (20 mg total) by mouth daily as needed., Disp: 10 tablet, Rfl: 1  Review of Systems  Constitutional: Negative for appetite change, chills and fever.  Respiratory: Positive for cough. Negative for chest tightness, shortness of breath and wheezing.   Cardiovascular: Negative for chest pain and palpitations.  Gastrointestinal: Negative for abdominal pain, nausea and vomiting.  Endocrine: Negative for cold intolerance, heat intolerance,  polydipsia, polyphagia and polyuria.    Social History  Substance Use Topics  . Smoking status: Never Smoker  . Smokeless tobacco: Never Used  . Alcohol use No   Objective:   BP (!) 150/78 (BP Location: Right Arm, Patient Position: Sitting, Cuff Size: Large)   Pulse 84   Temp 97.9 F (36.6 C) (Oral)   Resp 16   Wt 193 lb (87.5 kg)   SpO2 99% Comment: room air  BMI 32.12 kg/m   Physical Exam  General Appearance:    Alert, cooperative, no distress  HENT:   ENT exam normal, no neck nodes or sinus tenderness  Eyes:    PERRL, conjunctiva/corneas clear, EOM's intact       Lungs:     Clear to auscultation bilaterally, respirations unlabored  Heart:    Regular rate and rhythm  Neurologic:   Awake, alert, oriented x 3. No apparent focal neurological           defect.        Results for orders placed or performed in visit on 03/31/16  POCT HgB A1C  Result Value Ref Range   Hemoglobin A1C 10.6    Est. average glucose Bld gHb Est-mCnc 258        Assessment & Plan:     1. Uncontrolled type 2 diabetes mellitus with complication, with long-term current use of insulin ([place]) Off Invokana due to causing thirst and polyuria, but some improvement in A1c today. He has stopped drinking soft drinks and working on eliminating sugars in diet. He does not want to add any new medications. Will increase Lantus to 60 units a day and check a1c in 3 months.  - POCT HgB A1C - Insulin Glargine (LANTUS SOLOSTAR) 100 UNIT/ML Solostar Pen; Inject 60 Units into the skin daily.  Dispense: 1 mL; Refill: 1  2. Cough No other infectious signs, likely weather related or early URI. Call if symptoms progress, get worse, or not improving in 7-10 days.  - benzonatate (TESSALON PERLES) 100 MG capsule; Take 1 capsule (100 mg total) by mouth 3 (three) times daily as needed for cough.  Dispense: 20 capsule; Refill: 0  3. Need for influenza vaccination  - Flu Vaccine QUAD 36+ mos IM  4. ED (erectile dysfunction)  of organic origin Change from Cialis to Viagra 100mg  as needed.   Return in about 3 months (around 07/01/2016).        Lelon Huh, MD  [facility]

## 2016-04-13 ENCOUNTER — Other Ambulatory Visit: Payer: Self-pay | Admitting: *Deleted

## 2016-04-13 MED ORDER — PEN NEEDLES 31G X 5 MM MISC: 1.0000 [IU] | Freq: Four times a day (QID) | 4 refills | Status: DC

## 2016-05-25 ENCOUNTER — Other Ambulatory Visit: Payer: Self-pay | Admitting: Family Medicine

## 2016-05-25 NOTE — Telephone Encounter (Signed)
Pt called to make sure we received the Rx request from [facility] b/c they had advised pt they had sent several. I advised we received it today. Pt stated that he is very low on the medication. Please advise. Thanks TNP

## 2016-05-26 NOTE — Telephone Encounter (Signed)
Please addressed

## 2016-05-26 NOTE — Telephone Encounter (Signed)
rx was sent yesterday but I don't see verification that it was received by [facility]. Can you please call [facility] to make sure they received prescription. Thanks.

## 2016-07-04 ENCOUNTER — Encounter: Payer: Self-pay | Admitting: Family Medicine

## 2016-07-04 ENCOUNTER — Ambulatory Visit (INDEPENDENT_AMBULATORY_CARE_PROVIDER_SITE_OTHER): Payer: 59 | Admitting: Family Medicine

## 2016-07-04 VITALS — BP 110/70 | HR 84 | Temp 98.2°F | Resp 16 | Ht 65.0 in | Wt 189.0 lb

## 2016-07-04 DIAGNOSIS — IMO0002 Reserved for concepts with insufficient information to code with codable children: Secondary | ICD-10-CM

## 2016-07-04 DIAGNOSIS — E118 Type 2 diabetes mellitus with unspecified complications: Secondary | ICD-10-CM

## 2016-07-04 DIAGNOSIS — Z794 Long term (current) use of insulin: Secondary | ICD-10-CM

## 2016-07-04 DIAGNOSIS — E1165 Type 2 diabetes mellitus with hyperglycemia: Secondary | ICD-10-CM

## 2016-07-04 LAB — POCT GLYCOSYLATED HEMOGLOBIN (HGB A1C): HEMOGLOBIN A1C: 13.2

## 2016-07-04 MED ORDER — INSULIN GLARGINE-LIXISENATIDE 100-33 UNT-MCG/ML ~~LOC~~ SOPN: 30.0000 [IU] | PEN_INJECTOR | Freq: Every day | SUBCUTANEOUS | 0 refills | Status: DC

## 2016-07-04 MED ORDER — LOSARTAN POTASSIUM 50 MG PO TABS: 50.0000 mg | ORAL_TABLET | Freq: Every day | ORAL | 4 refills | Status: DC

## 2016-07-04 NOTE — Progress Notes (Signed)
Patient: Robert [name] Male    DOB: 01/30/1961   56 y.o.   MRN: [id number] Visit Date: 07/04/2016  Today's Provider: Lelon Huh, MD   Chief Complaint  Patient presents with  . Follow-up  . Diabetes  . Hyperlipidemia  . Erectile Dysfunction   Subjective:    HPI  ED (erectile dysfunction) of organic origin From 03/31/2016-Change from Cialis to Viagra 100mg  as needed.   Diabetes Mellitus Type II, Follow-up:   Lab Results  Component Value Date   HGBA1C 13.2 07/04/2016   HGBA1C 10.6 03/31/2016   HGBA1C 14.0 07/23/2015   Last seen for diabetes 3 months ago.  Management since then includes; Will increase Lantus to 60 units a day and check a1c in 3 months.  He reports good compliance with treatment. He is not having side effects. none Current symptoms include none and have been unchanged. Home blood sugar records: fasting range: 220-240, but hasn't checked for several weeks.   Episodes of hypoglycemia? no   Current Insulin Regimen: 60 units Lantus Most Recent Eye Exam: due Weight trend: stable Prior visit with dietician: no Current diet: in general, an "unhealthy" diet Current exercise: none and walking work  ----------------------------------------------------------------    Lipid/Cholesterol, Follow-up:   Last seen for this 1 year ago.  Management since that visit includes; started pravastatin 40 qd .  Last Lipid Panel:    Component Value Date/Time   CHOL 174 12/31/2014 0943   TRIG 66 12/31/2014 0943   HDL 67 12/31/2014 0943   CHOLHDL 2.6 12/31/2014 0943   LDLCALC 94 12/31/2014 0943    He reports good compliance with treatment. He is not having side effects. none  Wt Readings from Last 3 Encounters:  07/04/16 189 lb (85.7 kg)  03/31/16 193 lb (87.5 kg)  07/30/15 190 lb (86.2 kg)    ----------------------------------------------------------------    No Known Allergies   Current Outpatient Prescriptions:  .  aspirin EC 81 MG tablet,  Take 81 mg by mouth daily., Disp: , Rfl:  .  diltiazem (TIAZAC) 180 MG 24 hr capsule, Take 1 capsule (180 mg total) by mouth daily., Disp: 30 capsule, Rfl: 1 .  glucose blood (ONETOUCH VERIO) test strip, 1 strip to check blood sugar 3 times daily. DX: E11.65 Uncontrolled insulin dependent diabetes., Disp: 300 each, Rfl: 4 .  ibuprofen (ADVIL,MOTRIN) 800 MG tablet, Take 1 tablet by mouth  every 8 hours as needed, Disp: 270 tablet, Rfl: 1 .  Insulin Glargine (LANTUS SOLOSTAR) 100 UNIT/ML Solostar Pen, Inject 60 Units into the skin daily., Disp: 1 mL, Rfl: 1 .  Insulin Pen Needle (PEN NEEDLES) 31G X 5 MM MISC, 1 Units by Does not apply route 4 (four) times daily., Disp: 360 each, Rfl: 4 .  losartan (COZAAR) 100 MG tablet, Take 0.5 tablets (50 mg total) by mouth daily., Disp: 30 tablet, Rfl: 1 .  metFORMIN (GLUCOPHAGE) 1000 MG tablet, TAKE 1 TABLET BY MOUTH  DAILY, Disp: 30 tablet, Rfl: 12 .  sildenafil (VIAGRA) 100 MG tablet, Take 0.5-1 tablets (50-100 mg total) by mouth daily as needed for erectile dysfunction., Disp: 15 tablet, Rfl: 0 .  HUMALOG KWIKPEN 100 UNIT/ML KiwkPen, Inject subcutaneously up to 10 units before meals as  needed (Patient not taking: Reported on 07/04/2016), Disp: 30 mL, Rfl: 5 .  pravastatin (PRAVACHOL) 40 MG tablet, Take 1 tablet (40 mg total) by mouth daily. (Patient not taking: Reported on 07/04/2016), Disp: 30 tablet, Rfl: 12  Review of  Systems  Constitutional: Negative for appetite change, chills and fever.  Respiratory: Negative for chest tightness, shortness of breath and wheezing.   Cardiovascular: Negative for chest pain and palpitations.  Gastrointestinal: Negative for abdominal pain, nausea and vomiting.    Social History  Substance Use Topics  . Smoking status: Never Smoker  . Smokeless tobacco: Never Used  . Alcohol use No   Objective:   BP 110/70 (BP Location: Right Arm, Patient Position: Sitting, Cuff Size: Large)   Pulse 84   Temp 98.2 F (36.8 C)  (Oral)   Resp 16   Ht 5\' 5"  (1.651 m)   Wt 189 lb (85.7 kg)   SpO2 97%   BMI 31.45 kg/m   Physical Exam  General appearance: alert, well developed, well nourished, cooperative and in no distress Head: Normocephalic, without obvious abnormality, atraumatic Respiratory: Respirations even and unlabored, normal respiratory rate Extremities: No gross deformities Skin: Skin color, texture, turgor normal. No rashes seen  Psych: Appropriate mood and affect. Neurologic: Mental status: Alert, oriented to person, place, and time, thought content appropriate.  Results for orders placed or performed in visit on 07/04/16  POCT glycosylated hemoglobin (Hb A1C)  Result Value Ref Range   Hemoglobin A1C 13.2    Est. average glucose Bld gHb Est-mCnc >326        Assessment & Plan:     1. Uncontrolled type 2 diabetes mellitus with complication, with long-term current use of insulin ([facility]) Worsening, change lantus to [place].  - POCT glycosylated hemoglobin (Hb A1C) - Insulin Glargine-Lixisenatide (SOLIQUA) 100-33 UNT-MCG/ML SOPN; Inject 30 Units into the skin daily. for one week, then 35 units/day for 1 week, then 40 units/day for 1 week, then 45 units/day for 1 week, then 50 units/day  Dispense: 5 pen; Refill: 0  Return in about 7 weeks (around 08/22/2016).        Lelon Huh, MD  [facility]

## 2016-07-06 ENCOUNTER — Other Ambulatory Visit: Payer: Self-pay | Admitting: Family Medicine

## 2016-08-22 ENCOUNTER — Ambulatory Visit (INDEPENDENT_AMBULATORY_CARE_PROVIDER_SITE_OTHER): Payer: 59 | Admitting: Family Medicine

## 2016-08-22 ENCOUNTER — Encounter: Payer: Self-pay | Admitting: Family Medicine

## 2016-08-22 ENCOUNTER — Other Ambulatory Visit: Payer: Self-pay | Admitting: *Deleted

## 2016-08-22 VITALS — BP 134/78 | HR 78 | Temp 98.6°F | Resp 16 | Wt 191.0 lb

## 2016-08-22 DIAGNOSIS — E1165 Type 2 diabetes mellitus with hyperglycemia: Secondary | ICD-10-CM | POA: Diagnosis not present

## 2016-08-22 DIAGNOSIS — E118 Type 2 diabetes mellitus with unspecified complications: Secondary | ICD-10-CM

## 2016-08-22 DIAGNOSIS — Z794 Long term (current) use of insulin: Secondary | ICD-10-CM | POA: Diagnosis not present

## 2016-08-22 DIAGNOSIS — IMO0002 Reserved for concepts with insufficient information to code with codable children: Secondary | ICD-10-CM

## 2016-08-22 LAB — POCT GLYCOSYLATED HEMOGLOBIN (HGB A1C)
Est. average glucose Bld gHb Est-mCnc: 246
Hemoglobin A1C: 10.2

## 2016-08-22 MED ORDER — INSULIN GLARGINE-LIXISENATIDE 100-33 UNT-MCG/ML ~~LOC~~ SOPN: 55.0000 [IU] | PEN_INJECTOR | Freq: Every day | SUBCUTANEOUS | 1 refills | Status: DC

## 2016-08-22 MED ORDER — INSULIN GLARGINE-LIXISENATIDE 100-33 UNT-MCG/ML ~~LOC~~ SOPN: 55.0000 [IU] | PEN_INJECTOR | Freq: Every day | SUBCUTANEOUS | 0 refills | Status: DC

## 2016-08-22 NOTE — Progress Notes (Signed)
Patient: Robert [name] Male    DOB: 1961-01-05   56 y.o.   MRN: [id number] Visit Date: 08/22/2016  Today's Provider: Lelon Huh, MD   Chief Complaint  Patient presents with  . Diabetes   Subjective:    HPI  Diabetes Mellitus Type II, Follow-up:   Lab Results  Component Value Date   HGBA1C 10.2 08/22/2016   HGBA1C 13.2 07/04/2016   HGBA1C 10.6 03/31/2016    Last seen for diabetes 7 weeks ago.  Management since then includes discontinued Lantus and started on Soliqua. He reports good compliance with treatment. He is not having side effects.  Current symptoms include none and have been stable. Home blood sugar records: FBS 170s.   Episodes of hypoglycemia? no   Current Insulin Regimen: 50 units daily.  Most Recent Eye Exam: due  Weight trend: stable Prior visit with dietician: no Current diet: well balanced Current exercise: walking  Pertinent Labs:    Component Value Date/Time   CHOL 174 12/31/2014 0943   TRIG 66 12/31/2014 0943   HDL 67 12/31/2014 0943   LDLCALC 94 12/31/2014 0943   CREATININE 1.58 (H) 07/30/2015 1136    Wt Readings from Last 3 Encounters:  08/22/16 191 lb (86.6 kg)  07/04/16 189 lb (85.7 kg)  03/31/16 193 lb (87.5 kg)          No Known Allergies   Current Outpatient Prescriptions:  .  aspirin EC 81 MG tablet, Take 81 mg by mouth daily., Disp: , Rfl:  .  diltiazem (TIAZAC) 180 MG 24 hr capsule, TAKE 1 CAPSULE BY MOUTH  DAILY, Disp: 90 capsule, Rfl: 3 .  glucose blood (ONETOUCH VERIO) test strip, 1 strip to check blood sugar 3 times daily. DX: E11.65 Uncontrolled insulin dependent diabetes., Disp: 300 each, Rfl: 4 .  ibuprofen (ADVIL,MOTRIN) 800 MG tablet, Take 1 tablet by mouth  every 8 hours as needed, Disp: 270 tablet, Rfl: 1 .  Insulin Glargine-Lixisenatide (SOLIQUA) 100-33 UNT-MCG/ML SOPN, Inject 30 Units into the skin daily. for one week, then 35 units/day for 1 week, then 40 units/day for 1 week, then 45 units/day  for 1 week, then 50 units/day, Disp: 5 pen, Rfl: 0 .  Insulin Pen Needle (PEN NEEDLES) 31G X 5 MM MISC, 1 Units by Does not apply route 4 (four) times daily., Disp: 360 each, Rfl: 4 .  losartan (COZAAR) 50 MG tablet, Take 1 tablet (50 mg total) by mouth daily., Disp: 90 tablet, Rfl: 4 .  metFORMIN (GLUCOPHAGE) 1000 MG tablet, TAKE 1 TABLET BY MOUTH  DAILY, Disp: 30 tablet, Rfl: 12 .  sildenafil (VIAGRA) 100 MG tablet, Take 0.5-1 tablets (50-100 mg total) by mouth daily as needed for erectile dysfunction., Disp: 15 tablet, Rfl: 0 .  pravastatin (PRAVACHOL) 40 MG tablet, Take 1 tablet (40 mg total) by mouth daily. (Patient not taking: Reported on 07/04/2016), Disp: 30 tablet, Rfl: 12  Review of Systems  Constitutional: Negative.   Cardiovascular: Negative.   Endocrine: Negative.     Social History  Substance Use Topics  . Smoking status: Never Smoker  . Smokeless tobacco: Never Used  . Alcohol use No   Objective:   BP 134/78 (BP Location: Left Arm, Patient Position: Sitting)   Pulse 78   Temp 98.6 F (37 C)   Resp 16   Wt 191 lb (86.6 kg)   SpO2 98%   BMI 31.78 kg/m  Vitals:   08/22/16 0831  BP: 134/78  Pulse: 78  Resp: 16  Temp: 98.6 F (37 C)  SpO2: 98%  Weight: 191 lb (86.6 kg)     Physical Exam   General Appearance:    Alert, cooperative, no distress  Eyes:    PERRL, conjunctiva/corneas clear, EOM's intact       Lungs:     Clear to auscultation bilaterally, respirations unlabored  Heart:    Regular rate and rhythm  Neurologic:   Awake, alert, oriented x 3. No apparent focal neurological           defect.        Results for orders placed or performed in visit on 08/22/16  POCT glycosylated hemoglobin (Hb A1C)  Result Value Ref Range   Hemoglobin A1C 10.2    Est. average glucose Bld gHb Est-mCnc 246        Assessment & Plan:     1. Uncontrolled type 2 diabetes mellitus with complication, with long-term current use of insulin ([place]) Tolerating change from  Lantus to [facility] well with significant improvement in a1c. Increase from 50 to 55 units a day and follow up to check a1c and routine labs in 3 months.   - POCT glycosylated hemoglobin (Hb A1C) - Insulin Glargine-Lixisenatide (SOLIQUA) 100-33 UNT-MCG/ML SOPN; Inject 55 Units into the skin daily.  Dispense: 17 pen; Refill: 1       Lelon Huh, MD  [facility]

## 2016-08-24 ENCOUNTER — Other Ambulatory Visit: Payer: Self-pay | Admitting: Family Medicine

## 2016-08-24 DIAGNOSIS — IMO0002 Reserved for concepts with insufficient information to code with codable children: Secondary | ICD-10-CM

## 2016-08-24 DIAGNOSIS — Z794 Long term (current) use of insulin: Principal | ICD-10-CM

## 2016-08-24 DIAGNOSIS — E118 Type 2 diabetes mellitus with unspecified complications: Principal | ICD-10-CM

## 2016-08-24 DIAGNOSIS — E1165 Type 2 diabetes mellitus with hyperglycemia: Secondary | ICD-10-CM

## 2016-09-19 ENCOUNTER — Other Ambulatory Visit: Payer: Self-pay | Admitting: Family Medicine

## 2016-09-19 DIAGNOSIS — R05 Cough: Secondary | ICD-10-CM

## 2016-09-19 DIAGNOSIS — R059 Cough, unspecified: Secondary | ICD-10-CM

## 2016-10-17 LAB — HM DIABETES EYE EXAM

## 2016-11-07 ENCOUNTER — Other Ambulatory Visit: Payer: Self-pay | Admitting: Family Medicine

## 2016-11-23 ENCOUNTER — Encounter: Payer: Self-pay | Admitting: Family Medicine

## 2016-11-23 ENCOUNTER — Ambulatory Visit (INDEPENDENT_AMBULATORY_CARE_PROVIDER_SITE_OTHER): Payer: 59 | Admitting: Family Medicine

## 2016-11-23 VITALS — BP 138/84 | HR 84 | Temp 98.5°F | Resp 16 | Ht 65.0 in | Wt 195.0 lb

## 2016-11-23 DIAGNOSIS — E118 Type 2 diabetes mellitus with unspecified complications: Secondary | ICD-10-CM

## 2016-11-23 DIAGNOSIS — Z794 Long term (current) use of insulin: Secondary | ICD-10-CM

## 2016-11-23 DIAGNOSIS — I10 Essential (primary) hypertension: Secondary | ICD-10-CM

## 2016-11-23 DIAGNOSIS — Z6832 Body mass index (BMI) 32.0-32.9, adult: Secondary | ICD-10-CM | POA: Diagnosis not present

## 2016-11-23 DIAGNOSIS — E11319 Type 2 diabetes mellitus with unspecified diabetic retinopathy without macular edema: Secondary | ICD-10-CM

## 2016-11-23 DIAGNOSIS — Z125 Encounter for screening for malignant neoplasm of prostate: Secondary | ICD-10-CM | POA: Diagnosis not present

## 2016-11-23 DIAGNOSIS — E1165 Type 2 diabetes mellitus with hyperglycemia: Secondary | ICD-10-CM

## 2016-11-23 DIAGNOSIS — Z89611 Acquired absence of right leg above knee: Secondary | ICD-10-CM | POA: Diagnosis not present

## 2016-11-23 DIAGNOSIS — N529 Male erectile dysfunction, unspecified: Secondary | ICD-10-CM

## 2016-11-23 DIAGNOSIS — IMO0002 Reserved for concepts with insufficient information to code with codable children: Secondary | ICD-10-CM

## 2016-11-23 LAB — POCT GLYCOSYLATED HEMOGLOBIN (HGB A1C)
Est. average glucose Bld gHb Est-mCnc: 189
Hemoglobin A1C: 8.2

## 2016-11-23 LAB — POCT UA - MICROALBUMIN: Microalbumin Ur, POC: 100 mg/L

## 2016-11-23 MED ORDER — LOSARTAN POTASSIUM 100 MG PO TABS: 100.0000 mg | ORAL_TABLET | Freq: Every day | ORAL | 3 refills | Status: DC

## 2016-11-23 NOTE — Progress Notes (Signed)
Patient: Robert [name] Male    DOB: 05/16/1960   56 y.o.   MRN: [id number] Visit Date: 11/23/2016  Today's Provider: Lelon Huh, MD   Chief Complaint  Patient presents with  . Diabetes  . Hypertension   Subjective:    HPI  Diabetes Mellitus Type II, Follow-up:   Lab Results  Component Value Date   HGBA1C 8.2 11/23/2016   HGBA1C 10.2 08/22/2016   HGBA1C 13.2 07/04/2016    Last seen for diabetes 3 months ago.  Management since then includes increasing Soliqua to 56 units daily. He reports good compliance with treatment. He is not having side effects.  Current symptoms include none and have been stable. Home blood sugar records: fasting range: 150s  Episodes of hypoglycemia? no   Current Insulin Regimen:  Most Recent Eye Exam: last year Weight trend: stable Prior visit with dietician: no Current diet: well balanced Current exercise: no regular exercise  Pertinent Labs:    Component Value Date/Time   CHOL 174 12/31/2014 0943   TRIG 66 12/31/2014 0943   HDL 67 12/31/2014 0943   LDLCALC 94 12/31/2014 0943   CREATININE 1.58 (H) 07/30/2015 1136    Wt Readings from Last 3 Encounters:  11/23/16 195 lb (88.5 kg)  08/22/16 191 lb (86.6 kg)  07/04/16 189 lb (85.7 kg)      Hypertension, follow-up:  BP Readings from Last 3 Encounters:  11/23/16 138/84  08/22/16 134/78  07/04/16 110/70    He was last seen for hypertension 3 months ago.  BP at that visit was 134/78. Management since that visit includes no changes. He reports good compliance with treatment. He is not having side effects.  He is not exercising. He is adherent to low salt diet.   Outside blood pressures are not being checked. He is experiencing none.  Patient denies exertional chest pressure/discomfort, lower extremity edema and palpitations.   Cardiovascular risk factors include diabetes mellitus.   Weight trend: stable Wt Readings from Last 3 Encounters:  11/23/16 195 lb (88.5  kg)  08/22/16 191 lb (86.6 kg)  07/04/16 189 lb (85.7 kg)    Current diet: well balanced         No Known Allergies   Current Outpatient Prescriptions:  .  aspirin EC 81 MG tablet, Take 81 mg by mouth daily., Disp: , Rfl:  .  benzonatate (TESSALON) 100 MG capsule, TAKE 1 CAPSULE BY MOUTH 3  TIMES DAILY AS NEEDED FOR  COUGH, Disp: 20 capsule, Rfl: 4 .  diltiazem (TIAZAC) 180 MG 24 hr capsule, TAKE 1 CAPSULE BY MOUTH  DAILY, Disp: 90 capsule, Rfl: 3 .  glucose blood (ONETOUCH VERIO) test strip, 1 strip to check blood sugar 3 times daily. DX: E11.65 Uncontrolled insulin dependent diabetes., Disp: 300 each, Rfl: 4 .  IBU 800 MG tablet, TAKE 1 TABLET BY MOUTH  EVERY 8 HOURS AS NEEDED, Disp: 270 tablet, Rfl: 1 .  Insulin Pen Needle (PEN NEEDLES) 31G X 5 MM MISC, 1 Units by Does not apply route 4 (four) times daily., Disp: 360 each, Rfl: 4 .  losartan (COZAAR) 50 MG tablet, Take 1 tablet (50 mg total) by mouth daily., Disp: 90 tablet, Rfl: 4 .  metFORMIN (GLUCOPHAGE) 1000 MG tablet, TAKE 1 TABLET BY MOUTH  DAILY, Disp: 30 tablet, Rfl: 12 .  sildenafil (VIAGRA) 100 MG tablet, Take 0.5-1 tablets (50-100 mg total) by mouth daily as needed for erectile dysfunction., Disp: 15 tablet, Rfl: 0 .  SOLIQUA 100-33 UNT-MCG/ML SOPN, INJECT 30 UNITS/DAY X7DAY, 35 UNITS/DAY X7DAY, 40 UNITS/DAY X7DAY, 45 UNITS/DAY X7DAY, 50 UNITS/DAY, Disp: 5 pen, Rfl: 11 .  pravastatin (PRAVACHOL) 40 MG tablet, Take 1 tablet (40 mg total) by mouth daily. (Patient not taking: Reported on 07/04/2016), Disp: 30 tablet, Rfl: 12  Review of Systems  Constitutional: Negative.   Respiratory: Negative.   Cardiovascular: Negative.   Endocrine: Negative.   Musculoskeletal: Negative.   Neurological: Negative.   Psychiatric/Behavioral: Negative.     Social History  Substance Use Topics  . Smoking status: Never Smoker  . Smokeless tobacco: Never Used  . Alcohol use No   Objective:   BP 138/84 (BP Location: Right Arm,  Patient Position: Sitting, Cuff Size: Normal)   Pulse 84   Temp 98.5 F (36.9 C)   Resp 16   Ht 5\' 5"  (1.651 m)   Wt 195 lb (88.5 kg)   SpO2 98%   BMI 32.45 kg/m  Vitals:   11/23/16 0816  BP: 138/84  Pulse: 84  Resp: 16  Temp: 98.5 F (36.9 C)  SpO2: 98%  Weight: 195 lb (88.5 kg)  Height: 5\' 5"  (1.651 m)     Physical Exam   General Appearance:    Alert, cooperative, no distress  Eyes:    PERRL, conjunctiva/corneas clear, EOM's intact       Lungs:     Clear to auscultation bilaterally, respirations unlabored  Heart:    Regular rate and rhythm  MS:   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        Results for orders placed or performed in visit on 11/23/16  POCT glycosylated hemoglobin (Hb A1C)  Result Value Ref Range   Hemoglobin A1C 8.2    Est. average glucose Bld gHb Est-mCnc 189   POCT UA - Microalbumin  Result Value Ref Range   Microalbumin Ur, POC 100 mg/L   Creatinine, POC  mg/dL   Albumin/Creatinine Ratio, Urine, POC         Assessment & Plan:     1. Essential hypertension BP is up somewhat. Will increase losartan to 100mg  daily.   2. Uncontrolled type 2 diabetes mellitus with complication, with long-term current use of insulin ([place]) Much improved and nearing goal on 56 units Soliqua - POCT glycosylated hemoglobin (Hb A1C) - POCT UA - Microalbumin  3. BMI 32.0-32.9,adult Patient has mildly elevated BMI, but body habitus is relatively muscular and not consistent with obesity. Encouraged regular exercise and healthy eating habits.    4. ED (erectile dysfunction) of organic origin He requests refill of Viagra today which was done.   5. Prostate cancer screening  - PSA  6. Uncontrolled type 2 diabetes mellitus with retinopathy, with long-term current use of insulin, macular edema presence unspecified, unspecified laterality, unspecified retinopathy severity ([place])  - Lipid panel - Renal function panel  7. 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   Return in about 3 months (around 02/23/2017).       Lelon Huh, MD  [facility]

## 2016-11-24 ENCOUNTER — Other Ambulatory Visit: Payer: Self-pay

## 2016-11-24 DIAGNOSIS — E7800 Pure hypercholesterolemia, unspecified: Secondary | ICD-10-CM

## 2016-11-24 LAB — LIPID PANEL
Chol/HDL Ratio: 2.6 ratio (ref 0.0–5.0)
Cholesterol, Total: 145 mg/dL (ref 100–199)
HDL: 55 mg/dL (ref >39)
LDL CALC: 79 mg/dL (ref 0–99)
Triglycerides: 53 mg/dL (ref 0–149)
VLDL CHOLESTEROL CAL: 11 mg/dL (ref 5–40)

## 2016-11-24 LAB — RENAL FUNCTION PANEL
Albumin: 4.5 g/dL (ref 3.5–5.5)
BUN / CREAT RATIO: 10 (ref 9–20)
BUN: 11 mg/dL (ref 6–24)
CHLORIDE: 101 mmol/L (ref 96–106)
CO2: 23 mmol/L (ref 20–29)
Calcium: 9.4 mg/dL (ref 8.7–10.2)
Creatinine, Ser: 1.14 mg/dL (ref 0.76–1.27)
GFR calc non Af Amer: 73 mL/min/{1.73_m2} (ref >59)
GFR, EST AFRICAN AMERICAN: 84 mL/min/{1.73_m2} (ref >59)
GLUCOSE: 106 mg/dL — AB (ref 65–99)
Phosphorus: 3.3 mg/dL (ref 2.5–4.5)
Potassium: 4 mmol/L (ref 3.5–5.2)
Sodium: 141 mmol/L (ref 134–144)

## 2016-11-24 LAB — PSA: Prostate Specific Ag, Serum: 0.4 ng/mL (ref 0.0–4.0)

## 2016-11-24 MED ORDER — PRAVASTATIN SODIUM 40 MG PO TABS: 40.0000 mg | ORAL_TABLET | Freq: Every day | ORAL | 1 refills | Status: DC

## 2016-11-27 ENCOUNTER — Other Ambulatory Visit: Payer: Self-pay | Admitting: Family Medicine

## 2016-11-27 DIAGNOSIS — E118 Type 2 diabetes mellitus with unspecified complications: Principal | ICD-10-CM

## 2016-11-27 DIAGNOSIS — IMO0002 Reserved for concepts with insufficient information to code with codable children: Secondary | ICD-10-CM

## 2016-11-27 DIAGNOSIS — E1165 Type 2 diabetes mellitus with hyperglycemia: Secondary | ICD-10-CM

## 2016-11-27 DIAGNOSIS — Z794 Long term (current) use of insulin: Principal | ICD-10-CM

## 2017-02-21 ENCOUNTER — Other Ambulatory Visit: Payer: Self-pay | Admitting: Family Medicine

## 2017-02-21 DIAGNOSIS — E7800 Pure hypercholesterolemia, unspecified: Secondary | ICD-10-CM

## 2017-02-21 NOTE — Telephone Encounter (Signed)
Pharmacy requesting refills. Thanks!  

## 2017-03-01 ENCOUNTER — Ambulatory Visit: Payer: Self-pay | Admitting: Family Medicine

## 2017-07-11 ENCOUNTER — Telehealth: Payer: Self-pay | Admitting: Family Medicine

## 2017-07-11 NOTE — Telephone Encounter (Signed)
[facility] needs a prior authorization before they will send the [facility] to patient.   Once the prior authorization is done, the # needs to be called to    [phone]

## 2017-07-13 ENCOUNTER — Other Ambulatory Visit: Payer: Self-pay | Admitting: Family Medicine

## 2017-07-13 NOTE — Telephone Encounter (Signed)
In the process of starting PA.

## 2017-07-13 NOTE — Telephone Encounter (Signed)
Pt request call back for an update on PA for Insulin Glargine-Lixisenatide (SOLIQUA) 100-33 UNT-MCG/ML SOPN. Please advise. Thanks TNP

## 2017-07-13 NOTE — Telephone Encounter (Signed)
Advised patient's wife as well.

## 2017-07-13 NOTE — Telephone Encounter (Signed)
Received a message that Robert [name] was approved. Pharmacy was notified.

## 2017-07-23 ENCOUNTER — Other Ambulatory Visit: Payer: Self-pay | Admitting: *Deleted

## 2017-07-23 DIAGNOSIS — IMO0002 Reserved for concepts with insufficient information to code with codable children: Secondary | ICD-10-CM

## 2017-07-23 DIAGNOSIS — Z794 Long term (current) use of insulin: Principal | ICD-10-CM

## 2017-07-23 DIAGNOSIS — E118 Type 2 diabetes mellitus with unspecified complications: Principal | ICD-10-CM

## 2017-07-23 DIAGNOSIS — E1165 Type 2 diabetes mellitus with hyperglycemia: Secondary | ICD-10-CM

## 2017-07-25 MED ORDER — INSULIN GLARGINE-LIXISENATIDE 100-33 UNT-MCG/ML ~~LOC~~ SOPN: 56.0000 [IU] | PEN_INJECTOR | Freq: Every day | SUBCUTANEOUS | 0 refills | Status: DC

## 2017-07-25 NOTE — Telephone Encounter (Signed)
Please advise patient that a 90 day prescription Robert [name] was sent to mail order, but he is overdue for follow up ov. And a1c and needs to schedule this month.

## 2017-07-27 NOTE — Telephone Encounter (Signed)
LMOVM for pt to return call 

## 2017-07-30 NOTE — Telephone Encounter (Signed)
Patient scheduled follow-up for 08/10/2017 at 4:20pm.

## 2017-08-10 ENCOUNTER — Ambulatory Visit: Payer: Self-pay | Admitting: Family Medicine

## 2017-08-10 NOTE — Progress Notes (Deleted)
Patient: Robert [name] Male    DOB: [birth year]   57 y.o.   MRN: [id number] Visit Date: 08/10/2017  Today's Provider: Lelon Huh, MD   No chief complaint on file.  Subjective:    HPI   Diabetes Mellitus Type II, Follow-up:   Lab Results  Component Value Date   HGBA1C 8.2 11/23/2016   HGBA1C 10.2 08/22/2016   HGBA1C 13.2 07/04/2016   Last seen for diabetes 8 months ago.  Management since then includes; no changes. He reports {excellent/good/fair/poor:19665} compliance with treatment. He {ACTION; IS/IS [id number]} having side effects. *** Current symptoms include {Symptoms; diabetes:14075} and have been {Desc; course:15616}. Home blood sugar records: {diabetes glucometry results:16657}  Episodes of hypoglycemia? {yes***/no:17258}   Current Insulin Regimen: *** Most Recent Eye Exam: *** Weight trend: {trend:16658} Prior visit with dietician: {yes/no:17258} Current diet: {diet habits:16563} Current exercise: {exercise types:16438}  ------------------------------------------------------------------------   Hypertension, follow-up:  BP Readings from Last 3 Encounters:  11/23/16 138/84  08/22/16 134/78  07/04/16 110/70    He was last seen for hypertension 8 months ago.  BP at that visit was 138/84. Management since that visit includes; increased losartan to 100 mg qd.He reports {excellent/good/fair/poor:19665} compliance with treatment. He {ACTION; IS/IS [id number]} having side effects. *** He {is/is not:9024} exercising. He {is/is not:9024} adherent to low salt diet.   Outside blood pressures are ***. He is experiencing {Symptoms; cardiac:12860}.  Patient denies {Symptoms; cardiac:12860}.   Cardiovascular risk factors include {cv risk factors:510}.  Use of agents associated with hypertension: {bp agents assoc with hypertension:511::"none"}.   ------------------------------------------------------------------------  ED (erectile dysfunction) of  organic origin From 11/23/2016-co changes, refilled Viagra.  Elevated Cholesterol From 11/23/2016-labs checked, restarted pravastatin 40 mg qd.    No Known Allergies   Current Outpatient Medications:  .  aspirin EC 81 MG tablet, Take 81 mg by mouth daily., Disp: , Rfl:  .  diltiazem (TIAZAC) 180 MG 24 hr capsule, TAKE 1 CAPSULE BY MOUTH  DAILY, Disp: 90 capsule, Rfl: 4 .  glucose blood (ONETOUCH VERIO) test strip, 1 strip to check blood sugar 3 times daily. DX: E11.65 Uncontrolled insulin dependent diabetes., Disp: 300 each, Rfl: 4 .  IBU 800 MG tablet, TAKE 1 TABLET BY MOUTH  EVERY 8 HOURS AS NEEDED, Disp: 270 tablet, Rfl: 1 .  Insulin Glargine-Lixisenatide (SOLIQUA) 100-33 UNT-MCG/ML SOPN, Inject 56 Units as directed daily., Disp: 45 mL, Rfl: 0 .  Insulin Pen Needle (PEN NEEDLES) 31G X 5 MM MISC, 1 Units by Does not apply route 4 (four) times daily., Disp: 360 each, Rfl: 4 .  losartan (COZAAR) 100 MG tablet, Take 1 tablet (100 mg total) by mouth daily., Disp: 90 tablet, Rfl: 3 .  metFORMIN (GLUCOPHAGE) 1000 MG tablet, TAKE 1 TABLET BY MOUTH  DAILY, Disp: 90 tablet, Rfl: 4 .  pravastatin (PRAVACHOL) 40 MG tablet, TAKE 1 TABLET BY MOUTH  DAILY, Disp: 90 tablet, Rfl: 4 .  sildenafil (VIAGRA) 100 MG tablet, Take 0.5-1 tablets (50-100 mg total) by mouth daily as needed for erectile dysfunction., Disp: 15 tablet, Rfl: 0  Review of Systems  Constitutional: Negative for appetite change, chills and fever.  Respiratory: Negative for chest tightness, shortness of breath and wheezing.   Cardiovascular: Negative for chest pain and palpitations.  Gastrointestinal: Negative for abdominal pain, nausea and vomiting.    Social History   Tobacco Use  . Smoking status: Never Smoker  . Smokeless tobacco: Never Used  Substance Use Topics  . Alcohol use: No  Alcohol/week: 0.0 oz   Objective:   There were no vitals taken for this visit. There were no vitals filed for this visit.   Physical  Exam      Assessment & Plan:           Lelon Huh, MD  [facility]

## 2017-08-17 ENCOUNTER — Ambulatory Visit: Payer: Managed Care, Other (non HMO) | Admitting: Family Medicine

## 2017-08-17 ENCOUNTER — Encounter: Payer: Self-pay | Admitting: Family Medicine

## 2017-08-17 VITALS — BP 120/60 | HR 81 | Temp 98.0°F | Resp 16 | Ht 65.0 in | Wt 190.0 lb

## 2017-08-17 DIAGNOSIS — N529 Male erectile dysfunction, unspecified: Secondary | ICD-10-CM

## 2017-08-17 DIAGNOSIS — E1165 Type 2 diabetes mellitus with hyperglycemia: Secondary | ICD-10-CM

## 2017-08-17 DIAGNOSIS — I10 Essential (primary) hypertension: Secondary | ICD-10-CM | POA: Diagnosis not present

## 2017-08-17 DIAGNOSIS — E118 Type 2 diabetes mellitus with unspecified complications: Secondary | ICD-10-CM

## 2017-08-17 DIAGNOSIS — Z794 Long term (current) use of insulin: Secondary | ICD-10-CM | POA: Diagnosis not present

## 2017-08-17 DIAGNOSIS — IMO0002 Reserved for concepts with insufficient information to code with codable children: Secondary | ICD-10-CM

## 2017-08-17 LAB — POCT GLYCOSYLATED HEMOGLOBIN (HGB A1C)
Est. average glucose Bld gHb Est-mCnc: 272
HEMOGLOBIN A1C: 11.1

## 2017-08-17 MED ORDER — NAPROXEN 500 MG PO TABS: 500.0000 mg | ORAL_TABLET | Freq: Two times a day (BID) | ORAL | 2 refills | Status: DC

## 2017-08-17 MED ORDER — SILDENAFIL CITRATE 100 MG PO TABS: 50.0000 mg | ORAL_TABLET | Freq: Every day | ORAL | 5 refills | Status: DC | PRN

## 2017-08-17 MED ORDER — EMPAGLIFLOZIN 10 MG PO TABS: 10.0000 mg | ORAL_TABLET | Freq: Every day | ORAL | 0 refills | Status: DC

## 2017-08-17 NOTE — Patient Instructions (Signed)
   Start taking Jardiance 10mg  tablet once every morning   Check your blood sugar every day and call me if it gets under 100

## 2017-08-17 NOTE — Progress Notes (Signed)
Patient: Robert [name] Male    DOB: Sep 19, 1960   57 y.o.   MRN: [id number] Visit Date: 08/17/2017  Today's Provider: Lelon Huh, MD   Chief Complaint  Patient presents with  . Follow-up  . Hypertension  . Diabetes   Subjective:    HPI   Diabetes Mellitus Type II, Follow-up:   Lab Results  Component Value Date   HGBA1C 8.2 11/23/2016   HGBA1C 10.2 08/22/2016   HGBA1C 13.2 07/04/2016   Last seen for diabetes 9 months ago.  Management since then includes; no changes. He reports good compliance with treatment. He is not having side effects. none Current symptoms include none and have been unchanged. Home blood sugar records: fasting range: 115  Episodes of hypoglycemia? no   Current Insulin Regimen: n/a Most Recent Eye Exam: 10/17/16 Weight trend: stable Prior visit with dietician: no Current diet: well balanced Current exercise: no regular exercise  ----------------------------------------------------------------   Hypertension, follow-up:  BP Readings from Last 3 Encounters:  08/17/17 120/60  11/23/16 138/84  08/22/16 134/78    He was last seen for hypertension 9 months ago.  BP at that visit was 13884. Management since that visit includes;increased losartan to 100 mg qd .He reports good compliance with treatment. He is not having side effects. none He is not exercising. He is adherent to low salt diet.   Outside blood pressures are not checking. He is experiencing none.  Patient denies none.   Cardiovascular risk factors include diabetes mellitus.  Use of agents associated with hypertension: none ----------------------------------------------------------------  ED (erectile dysfunction) of organic origin from 11/23/2016-refilled Viagra. He reports it is working well and would like a refill.   Elevated Cholesterol From 11/23/2016-labs checked, started pravastatin 40 mg qd which he reports he is taking and tolerating without adverse effect.    Lipid Panel     Component Value Date/Time   CHOL 145 11/23/2016 0907   TRIG 53 11/23/2016 0907   HDL 55 11/23/2016 0907   CHOLHDL 2.6 11/23/2016 0907   LDLCALC 79 11/23/2016 0907       No Known Allergies   Current Outpatient Medications:  .  aspirin EC 81 MG tablet, Take 81 mg by mouth daily., Disp: , Rfl:  .  diltiazem (TIAZAC) 180 MG 24 hr capsule, TAKE 1 CAPSULE BY MOUTH  DAILY, Disp: 90 capsule, Rfl: 4 .  glucose blood (ONETOUCH VERIO) test strip, 1 strip to check blood sugar 3 times daily. DX: E11.65 Uncontrolled insulin dependent diabetes., Disp: 300 each, Rfl: 4 .  IBU 800 MG tablet, TAKE 1 TABLET BY MOUTH  EVERY 8 HOURS AS NEEDED, Disp: 270 tablet, Rfl: 1 .  Insulin Glargine-Lixisenatide (SOLIQUA) 100-33 UNT-MCG/ML SOPN, Inject 56 Units as directed daily., Disp: 45 mL, Rfl: 0 .  Insulin Pen Needle (PEN NEEDLES) 31G X 5 MM MISC, 1 Units by Does not apply route 4 (four) times daily., Disp: 360 each, Rfl: 4 .  losartan (COZAAR) 100 MG tablet, Take 1 tablet (100 mg total) by mouth daily., Disp: 90 tablet, Rfl: 3 .  metFORMIN (GLUCOPHAGE) 1000 MG tablet, TAKE 1 TABLET BY MOUTH  DAILY, Disp: 90 tablet, Rfl: 4 .  pravastatin (PRAVACHOL) 40 MG tablet, TAKE 1 TABLET BY MOUTH  DAILY, Disp: 90 tablet, Rfl: 4 .  sildenafil (VIAGRA) 100 MG tablet, Take 0.5-1 tablets (50-100 mg total) by mouth daily as needed for erectile dysfunction., Disp: 15 tablet, Rfl: 0  Review of Systems  Constitutional: Negative for  appetite change, chills and fever.  Respiratory: Negative for chest tightness, shortness of breath and wheezing.   Cardiovascular: Negative for chest pain and palpitations.  Gastrointestinal: Negative for abdominal pain, nausea and vomiting.    Social History   Tobacco Use  . Smoking status: Never Smoker  . Smokeless tobacco: Never Used  Substance Use Topics  . Alcohol use: No    Alcohol/week: 0.0 oz   Objective:   BP 120/60 (BP Location: Right Arm, Patient Position:  Sitting, Cuff Size: Large)   Pulse 81   Temp 98 F (36.7 C) (Oral)   Resp 16   Ht 5\' 5"  (1.651 m)   Wt 190 lb (86.2 kg)   SpO2 96%   BMI 31.62 kg/m  Vitals:   08/17/17 1642  BP: 120/60  Pulse: 81  Resp: 16  Temp: 98 F (36.7 C)  TempSrc: Oral  SpO2: 96%  Weight: 190 lb (86.2 kg)  Height: 5\' 5"  (1.651 m)     Physical Exam   General Appearance:    Alert, cooperative, no distress  Eyes:    PERRL, conjunctiva/corneas clear, EOM's intact       Lungs:     Clear to auscultation bilaterally, respirations unlabored  Heart:    Regular rate and rhythm  Neurologic:   Awake, alert, oriented x 3. No apparent focal neurological           defect.       Results for orders placed or performed in visit on 08/17/17  POCT glycosylated hemoglobin (Hb A1C)  Result Value Ref Range   Hemoglobin A1C 11.1    Est. average glucose Bld gHb Est-mCnc 272        Assessment & Plan:     1. Uncontrolled type 2 diabetes mellitus with complication, with long-term current use of insulin ([facility]) Uncontrolled.  Add- empagliflozin (JARDIANCE) 10 MG TABS tablet; Take 10 mg by mouth daily.  Dispense: 35 tablet; Refill: 0 Will call before samples run out to make sure is effective and well tolerated.   2. ED (erectile dysfunction) of organic origin  - sildenafil (VIAGRA) 100 MG tablet; Take 0.5-1 tablets (50-100 mg total) by mouth daily as needed for erectile dysfunction.  Dispense: 10 tablet; Refill: 5  3. Essential hypertension Will reduced losartan to 1/2 tablet with initiation of Jardiance.        Lelon Huh, MD  [facility]

## 2017-08-29 ENCOUNTER — Telehealth: Payer: Self-pay

## 2017-08-29 NOTE — Telephone Encounter (Signed)
Patient called office requesting a prescription for Accu Check Aviva meter, testing strips and lancets sent into [facility]. KW

## 2017-08-29 NOTE — Telephone Encounter (Signed)
Please advise 

## 2017-08-31 MED ORDER — GLUCOSE BLOOD VI STRP: ORAL_STRIP | 4 refills | Status: DC

## 2017-08-31 MED ORDER — ACCU-CHEK AVIVA PLUS W/DEVICE KIT: PACK | 0 refills | Status: AC

## 2017-08-31 MED ORDER — ACCU-CHEK SOFT TOUCH LANCETS MISC: 4 refills | Status: DC

## 2017-09-24 ENCOUNTER — Other Ambulatory Visit: Payer: Self-pay

## 2017-09-25 MED ORDER — GLUCOSE BLOOD VI STRP: ORAL_STRIP | 4 refills | Status: DC

## 2017-09-26 ENCOUNTER — Telehealth: Payer: Self-pay | Admitting: Family Medicine

## 2017-09-26 DIAGNOSIS — E118 Type 2 diabetes mellitus with unspecified complications: Principal | ICD-10-CM

## 2017-09-26 DIAGNOSIS — Z794 Long term (current) use of insulin: Principal | ICD-10-CM

## 2017-09-26 DIAGNOSIS — E1165 Type 2 diabetes mellitus with hyperglycemia: Secondary | ICD-10-CM

## 2017-09-26 DIAGNOSIS — IMO0002 Reserved for concepts with insufficient information to code with codable children: Secondary | ICD-10-CM

## 2017-09-26 NOTE — Telephone Encounter (Signed)
Can have 4 10mg  boxes

## 2017-09-26 NOTE — Telephone Encounter (Signed)
Patient is requesting samples of Jardiance. Patient states his blood sugars have been running around 112-120. Please advise?

## 2017-09-26 NOTE — Telephone Encounter (Signed)
Pt was given some samples of Jardiance.  He will be out in about a week.  He would like to get some more samples to try.  He said he wasn't sure they were not causing some low blood sugar.  He said he may need to be seen before getting a whole prescription.  Pt's call back Is [phone]  Thanks teri

## 2017-09-27 MED ORDER — EMPAGLIFLOZIN 10 MG PO TABS: 10.0000 mg | ORAL_TABLET | Freq: Every day | ORAL | 0 refills | Status: DC

## 2017-09-27 NOTE — Telephone Encounter (Signed)
Patient advised sample are ready to pick up.

## 2017-09-28 ENCOUNTER — Telehealth: Payer: Self-pay | Admitting: Family Medicine

## 2017-09-28 MED ORDER — GLUCOSE BLOOD VI STRP: ORAL_STRIP | 0 refills | Status: DC

## 2017-09-28 NOTE — Telephone Encounter (Signed)
Pt's wife Rise Paganini contacted office for refill request on the following medications:  glucose blood (ACCU-CHEK AVIVA PLUS) test strip  [facility]  30 day supply  Rise Paganini stated that [facility] contacted pt this morning advising that [facility] had faxed request to our office advising that there is something missing off the Rx that was sent to them and they haven't heard from our office. Rise Paganini stated that [facility] said to contact our office to request a 30 day supply be sent to the local pharmacy until this is corrected because pt is almost out. Please advise. Thanks TNP

## 2017-10-02 ENCOUNTER — Other Ambulatory Visit: Payer: Self-pay | Admitting: Family Medicine

## 2017-10-12 ENCOUNTER — Other Ambulatory Visit: Payer: Self-pay | Admitting: Family Medicine

## 2017-10-15 ENCOUNTER — Other Ambulatory Visit: Payer: Self-pay | Admitting: *Deleted

## 2017-10-28 MED ORDER — GLUCOSE BLOOD VI STRP: ORAL_STRIP | 12 refills | Status: DC

## 2017-10-28 MED ORDER — ONETOUCH ULTRASOFT LANCETS MISC: 12 refills | Status: AC

## 2017-10-29 ENCOUNTER — Telehealth: Payer: Self-pay

## 2017-10-29 ENCOUNTER — Telehealth: Payer: Self-pay | Admitting: Family Medicine

## 2017-10-29 DIAGNOSIS — E1165 Type 2 diabetes mellitus with hyperglycemia: Secondary | ICD-10-CM

## 2017-10-29 DIAGNOSIS — IMO0002 Reserved for concepts with insufficient information to code with codable children: Secondary | ICD-10-CM

## 2017-10-29 DIAGNOSIS — Z794 Long term (current) use of insulin: Principal | ICD-10-CM

## 2017-10-29 DIAGNOSIS — E118 Type 2 diabetes mellitus with unspecified complications: Principal | ICD-10-CM

## 2017-10-29 MED ORDER — EMPAGLIFLOZIN 10 MG PO TABS: 10.0000 mg | ORAL_TABLET | Freq: Every day | ORAL | 0 refills | Status: DC

## 2017-10-29 NOTE — Telephone Encounter (Signed)
Samples are ready to pickup.

## 2017-10-29 NOTE — Telephone Encounter (Signed)
This is Dr Caryn Section patient

## 2017-10-29 NOTE — Telephone Encounter (Signed)
Appt was scheduled and samples are ready to pickup.

## 2017-10-29 NOTE — Telephone Encounter (Signed)
Pt would like samples of Jardiance 10mg  if available.    Thanks,   -Mickel Baas

## 2017-10-29 NOTE — Telephone Encounter (Signed)
Please advise patient he needs to schedule follow up o.v. In the next few weeks to check a1c since starting Jardiance. He can have 3 bottles of samples of Jardiance 10mg  to get back until office visit.

## 2017-11-13 ENCOUNTER — Encounter: Payer: Self-pay | Admitting: Family Medicine

## 2017-11-13 ENCOUNTER — Ambulatory Visit: Payer: Managed Care, Other (non HMO) | Admitting: Family Medicine

## 2017-11-13 VITALS — BP 104/66 | HR 108 | Temp 97.5°F | Resp 16 | Ht 65.0 in | Wt 183.0 lb

## 2017-11-13 DIAGNOSIS — Z1211 Encounter for screening for malignant neoplasm of colon: Secondary | ICD-10-CM | POA: Diagnosis not present

## 2017-11-13 DIAGNOSIS — E785 Hyperlipidemia, unspecified: Secondary | ICD-10-CM

## 2017-11-13 DIAGNOSIS — E118 Type 2 diabetes mellitus with unspecified complications: Secondary | ICD-10-CM | POA: Diagnosis not present

## 2017-11-13 DIAGNOSIS — I10 Essential (primary) hypertension: Secondary | ICD-10-CM

## 2017-11-13 DIAGNOSIS — Z794 Long term (current) use of insulin: Secondary | ICD-10-CM | POA: Diagnosis not present

## 2017-11-13 DIAGNOSIS — IMO0002 Reserved for concepts with insufficient information to code with codable children: Secondary | ICD-10-CM

## 2017-11-13 DIAGNOSIS — E1165 Type 2 diabetes mellitus with hyperglycemia: Secondary | ICD-10-CM | POA: Diagnosis not present

## 2017-11-13 LAB — POCT GLYCOSYLATED HEMOGLOBIN (HGB A1C)
Est. average glucose Bld gHb Est-mCnc: 183
HEMOGLOBIN A1C: 8 % — AB (ref 4.0–5.6)

## 2017-11-13 NOTE — Progress Notes (Signed)
Patient: Robert [name] Male    DOB: 06-05-1960   57 y.o.   MRN: [id number] Visit Date: 11/13/2017  Today's Provider: Lelon Huh, MD   Chief Complaint  Patient presents with  . Diabetes  . Hypertension   Subjective:    HPI   Diabetes Mellitus Type II, Follow-up:   Lab Results  Component Value Date   HGBA1C 11.1 08/17/2017   HGBA1C 8.2 11/23/2016   HGBA1C 10.2 08/22/2016   Last seen for diabetes 2 months ago.  Management since then includes; added Jardiance 10 mg qd (samples given) He reports good compliance with treatment. He is not having side effects.  [name] symptoms include none and have been stable. Home blood sugar records: fasting range: 110-120  Episodes of hypoglycemia? no   [name] Insulin Regimen: 56 units daily Most Recent Eye Exam: 1 year ago Weight trend: decreasing steadily Prior visit with dietician: no [name] diet: in general, an "unhealthy" diet [name] exercise: walking  ------------------------------------------------------------------------   Hypertension, follow-up:  BP Readings from Last 3 Encounters:  11/13/17 104/66  08/17/17 120/60  11/23/16 138/84    He was last seen for hypertension 2 months ago.  BP at that visit was 120/60. Management since that visit includes; reduced losartan to 1/2 tablet with the initiation of Jardiance .He reports good compliance with treatment. Patient has started back taking 1 full tablet daily. He is not having side effects.  He is exercising. He is adherent to low salt diet.   Outside blood pressures are not being checked. He is experiencing none.  Patient denies chest pain, chest pressure/discomfort, claudication, dyspnea, exertional chest pressure/discomfort, fatigue, irregular heart beat, lower extremity edema, near-syncope, orthopnea, palpitations, paroxysmal nocturnal dyspnea, syncope and tachypnea.   Cardiovascular risk factors include hypertension and male gender.  Use of agents  associated with hypertension: NSAIDS.   ------------------------------------------------------------------------    No Known Allergies   [name] Outpatient Medications:  .  aspirin EC 81 MG tablet, Take 81 mg by mouth daily., Disp: , Rfl:  .  Blood Glucose Monitoring Suppl (ACCU-CHEK AVIVA PLUS) w/Device KIT, Use to check blood sugar three times daily for insulin dependent diabetes (E11.9), Disp: 1 kit, Rfl: 0 .  diltiazem (TIAZAC) 180 MG 24 hr capsule, TAKE 1 CAPSULE BY MOUTH  DAILY, Disp: 90 capsule, Rfl: 4 .  empagliflozin (JARDIANCE) 10 MG TABS tablet, Take 10 mg by mouth daily., Disp: 21 tablet, Rfl: 0 .  glucose blood test strip, Use as instructed, Disp: 100 each, Rfl: 12 .  Insulin Glargine-Lixisenatide (SOLIQUA) 100-33 UNT-MCG/ML SOPN, Inject 56 Units as directed daily., Disp: 45 mL, Rfl: 0 .  Insulin Pen Needle (PEN NEEDLES) 31G X 5 MM MISC, 1 Units by Does not apply route 4 (four) times daily., Disp: 360 each, Rfl: 4 .  Lancets (ONETOUCH ULTRASOFT) lancets, Use as instructed, Disp: 100 each, Rfl: 12 .  losartan (COZAAR) 100 MG tablet, TAKE 1 TABLET BY MOUTH  DAILY, Disp: 90 tablet, Rfl: 2 .  metFORMIN (GLUCOPHAGE) 1000 MG tablet, TAKE 1 TABLET BY MOUTH  DAILY, Disp: 90 tablet, Rfl: 4 .  naproxen (NAPROSYN) 500 MG tablet, Take 1 tablet (500 mg total) by mouth 2 (two) times daily with a meal. Take in place of ibuprofen, Disp: 180 tablet, Rfl: 2 .  pravastatin (PRAVACHOL) 40 MG tablet, TAKE 1 TABLET BY MOUTH  DAILY, Disp: 90 tablet, Rfl: 4 .  sildenafil (VIAGRA) 100 MG tablet, Take 0.5-1 tablets (50-100 mg total) by mouth daily  as needed for erectile dysfunction., Disp: 10 tablet, Rfl: 5  Review of Systems  Constitutional: Negative for appetite change, chills and fever.  Respiratory: Negative for chest tightness, shortness of breath and wheezing.   Cardiovascular: Negative for chest pain and palpitations.  Gastrointestinal: Negative for abdominal pain, nausea and vomiting.     Social History   Tobacco Use  . Smoking status: Never Smoker  . Smokeless tobacco: Never Used  Substance Use Topics  . Alcohol use: No    Alcohol/week: 0.0 oz   Objective:   BP 104/66 (BP Location: Left Arm, Patient Position: Sitting, Cuff Size: Large)   Pulse (!) 108   Temp (!) 97.5 F (36.4 C) (Oral)   Resp 16   Ht _0  (1.651 m)   Wt 183 lb (83 kg)   SpO2 95% Comment: room air  BMI 30.45 kg/m  Vitals:   11/13/17 1613  BP: 104/66  Pulse: (!) 108  Resp: 16  Temp: (!) 97.5 F (36.4 C)  TempSrc: Oral  SpO2: 95%  Weight: 183 lb (83 kg)  Height: _1  (1.651 m)     Physical Exam   General Appearance:    Alert, cooperative, no distress  Eyes:    PERRL, conjunctiva/corneas clear, EOM's intact       Lungs:     Clear to auscultation bilaterally, respirations unlabored  Heart:    Regular rate and rhythm  Neurologic:   Awake, alert, oriented x 3. No apparent focal neurological           defect.       Results for orders placed or performed in visit on 11/13/17  POCT HgB A1C  Result Value Ref Range   Hemoglobin A1C 8.0 (A) 4.0 - 5.6 %   HbA1c POC (<> result, manual entry)  4.0 - 5.6 %   HbA1c, POC (prediabetic range)  5.7 - 6.4 %   HbA1c, POC (controlled diabetic range)  0.0 - 7.0 %   Est. average glucose Bld gHb Est-mCnc 183        Assessment & Plan:     1. Uncontrolled type 2 diabetes mellitus with complication, with long-term [name] use of insulin ([name]) Much better with addition of Jardiance samples. If labs normal will send in prescription  - POCT HgB A1C - Comprehensive Metabolic Panel (CMET) - Lipid panel  2. Hyperlipidemia, unspecified hyperlipidemia type He is tolerating pravastatin well with no adverse effects.   - Comprehensive Metabolic Panel (CMET) - Lipid panel  3. Essential hypertension Significant BP drop with addition of Jardiance. Will reduce losartan if he continues Jardiance.   4. Screening for colon cancer  - Cologuard        Robert Huh, MD  [facility]

## 2017-11-15 ENCOUNTER — Other Ambulatory Visit: Payer: Self-pay | Admitting: Family Medicine

## 2017-11-15 DIAGNOSIS — Z794 Long term (current) use of insulin: Principal | ICD-10-CM

## 2017-11-15 DIAGNOSIS — E118 Type 2 diabetes mellitus with unspecified complications: Secondary | ICD-10-CM

## 2017-11-15 DIAGNOSIS — E1165 Type 2 diabetes mellitus with hyperglycemia: Principal | ICD-10-CM

## 2017-11-15 DIAGNOSIS — IMO0002 Reserved for concepts with insufficient information to code with codable children: Secondary | ICD-10-CM

## 2017-11-15 LAB — COMPREHENSIVE METABOLIC PANEL
ALT: 32 IU/L (ref 0–44)
AST: 20 IU/L (ref 0–40)
Albumin/Globulin Ratio: 2.4 — ABNORMAL HIGH (ref 1.2–2.2)
Albumin: 4.7 g/dL (ref 3.5–5.5)
Alkaline Phosphatase: 79 IU/L (ref 39–117)
BILIRUBIN TOTAL: 0.4 mg/dL (ref 0.0–1.2)
BUN / CREAT RATIO: 15 (ref 9–20)
BUN: 21 mg/dL (ref 6–24)
CALCIUM: 9.9 mg/dL (ref 8.7–10.2)
CHLORIDE: 103 mmol/L (ref 96–106)
CO2: 21 mmol/L (ref 20–29)
Creatinine, Ser: 1.41 mg/dL — ABNORMAL HIGH (ref 0.76–1.27)
GFR, EST AFRICAN AMERICAN: 64 mL/min/{1.73_m2} (ref >59)
GFR, EST NON AFRICAN AMERICAN: 56 mL/min/{1.73_m2} — AB (ref >59)
GLOBULIN, TOTAL: 2 g/dL (ref 1.5–4.5)
Glucose: 156 mg/dL — ABNORMAL HIGH (ref 65–99)
Potassium: 4.5 mmol/L (ref 3.5–5.2)
SODIUM: 140 mmol/L (ref 134–144)
TOTAL PROTEIN: 6.7 g/dL (ref 6.0–8.5)

## 2017-11-15 LAB — LIPID PANEL
CHOL/HDL RATIO: 2.7 ratio (ref 0.0–5.0)
Cholesterol, Total: 145 mg/dL (ref 100–199)
HDL: 54 mg/dL (ref >39)
LDL Calculated: 75 mg/dL (ref 0–99)
Triglycerides: 78 mg/dL (ref 0–149)
VLDL Cholesterol Cal: 16 mg/dL (ref 5–40)

## 2017-11-15 MED ORDER — EMPAGLIFLOZIN 10 MG PO TABS: 10.0000 mg | ORAL_TABLET | Freq: Every day | ORAL | 2 refills | Status: DC

## 2017-11-16 MED ORDER — LOSARTAN POTASSIUM 50 MG PO TABS: 50.0000 mg | ORAL_TABLET | Freq: Every day | ORAL | 3 refills | Status: DC

## 2017-11-16 NOTE — Addendum Note (Signed)
Addended by: Birdie Sons on: 11/16/2017 07:57 AM   Modules accepted: Orders

## 2017-12-11 LAB — COLOGUARD: Cologuard: NEGATIVE

## 2018-01-09 LAB — HM DIABETES EYE EXAM

## 2018-01-10 ENCOUNTER — Encounter: Payer: Self-pay | Admitting: Family Medicine

## 2018-01-16 ENCOUNTER — Telehealth: Payer: Self-pay | Admitting: Family Medicine

## 2018-01-16 NOTE — Telephone Encounter (Addendum)
Called patient back for more information. Patient states he has symptoms of hot and cold sensation. Nasal congestion, dry cough x2 days. No fever and no shortness of breath. Patient does state he thinks he may have some wheezing. Offered patient an appt for 8:40 am tomorrow and he excepted.

## 2018-01-16 NOTE — Telephone Encounter (Signed)
Pt called wanting to know if Dr. Caryn Section could send in a rx for cough.  He has a cough, congestion, cold symptoms  [facility] uses [facility] [place]  Pt's CB# [phone]  Thanks Con Memos

## 2018-01-17 ENCOUNTER — Encounter: Payer: Self-pay | Admitting: Family Medicine

## 2018-01-17 ENCOUNTER — Ambulatory Visit: Payer: Managed Care, Other (non HMO) | Admitting: Family Medicine

## 2018-01-17 VITALS — BP 127/84 | HR 83 | Temp 98.5°F | Resp 16 | Wt 182.0 lb

## 2018-01-17 DIAGNOSIS — J069 Acute upper respiratory infection, unspecified: Secondary | ICD-10-CM

## 2018-01-17 DIAGNOSIS — R05 Cough: Secondary | ICD-10-CM | POA: Diagnosis not present

## 2018-01-17 DIAGNOSIS — R059 Cough, unspecified: Secondary | ICD-10-CM

## 2018-01-17 MED ORDER — BENZONATATE 200 MG PO CAPS: 200.0000 mg | ORAL_CAPSULE | Freq: Two times a day (BID) | ORAL | 0 refills | Status: DC | PRN

## 2018-01-17 NOTE — Progress Notes (Signed)
Patient: Robert [name] Male    DOB: 1960-12-15   57 y.o.   MRN: [id number] Visit Date: 01/17/2018  Today's Provider: Lelon Huh, MD   Chief Complaint  Patient presents with  . URI    x 2 days   Subjective:    URI   This is a new problem. Episode onset: 2 days ago. The problem has been gradually improving. There has been no fever. Associated symptoms include coughing (dry cough), rhinorrhea, sneezing and a sore throat (resolved). Pertinent negatives include no abdominal pain, congestion, ear pain, headaches, nausea, plugged ear sensation, sinus pain, vomiting or wheezing. Treatments tried: Emerge-C. The treatment provided moderate relief.       No Known Allergies   Current Outpatient Medications:  .  aspirin EC 81 MG tablet, Take 81 mg by mouth daily., Disp: , Rfl:  .  Blood Glucose Monitoring Suppl (ACCU-CHEK AVIVA PLUS) w/Device KIT, Use to check blood sugar three times daily for insulin dependent diabetes (E11.9), Disp: 1 kit, Rfl: 0 .  diltiazem (TIAZAC) 180 MG 24 hr capsule, TAKE 1 CAPSULE BY MOUTH  DAILY, Disp: 90 capsule, Rfl: 4 .  empagliflozin (JARDIANCE) 10 MG TABS tablet, Take 10 mg by mouth daily., Disp: 90 tablet, Rfl: 2 .  glucose blood test strip, Use as instructed, Disp: 100 each, Rfl: 12 .  Insulin Glargine-Lixisenatide (SOLIQUA) 100-33 UNT-MCG/ML SOPN, Inject 56 Units as directed daily., Disp: 45 mL, Rfl: 0 .  Insulin Pen Needle (PEN NEEDLES) 31G X 5 MM MISC, 1 Units by Does not apply route 4 (four) times daily., Disp: 360 each, Rfl: 4 .  Lancets (ONETOUCH ULTRASOFT) lancets, Use as instructed, Disp: 100 each, Rfl: 12 .  losartan (COZAAR) 50 MG tablet, Take 1 tablet (50 mg total) by mouth daily. Take in place of the 169m tablets., Disp: 90 tablet, Rfl: 3 .  metFORMIN (GLUCOPHAGE) 1000 MG tablet, TAKE 1 TABLET BY MOUTH  DAILY, Disp: 90 tablet, Rfl: 4 .  naproxen (NAPROSYN) 500 MG tablet, Take 1 tablet (500 mg total) by mouth 2 (two) times daily with a meal.  Take in place of ibuprofen, Disp: 180 tablet, Rfl: 2 .  pravastatin (PRAVACHOL) 40 MG tablet, TAKE 1 TABLET BY MOUTH  DAILY, Disp: 90 tablet, Rfl: 4 .  sildenafil (VIAGRA) 100 MG tablet, Take 0.5-1 tablets (50-100 mg total) by mouth daily as needed for erectile dysfunction., Disp: 10 tablet, Rfl: 5  Review of Systems  Constitutional: Negative for appetite change, chills, diaphoresis, fatigue and fever.  HENT: Positive for rhinorrhea, sneezing and sore throat (resolved). Negative for congestion, ear pain and sinus pain.   Respiratory: Positive for cough (dry cough). Negative for chest tightness and wheezing.   Cardiovascular: Negative for palpitations.  Gastrointestinal: Negative for abdominal pain, nausea and vomiting.  Neurological: Negative for headaches.    Social History   Tobacco Use  . Smoking status: Never Smoker  . Smokeless tobacco: Never Used  Substance Use Topics  . Alcohol use: No    Alcohol/week: 0.0 standard drinks   Objective:   BP 127/84 (BP Location: Right Arm, Patient Position: Sitting, Cuff Size: Large)   Pulse 83   Temp 98.5 F (36.9 C) (Oral)   Resp 16   Wt 182 lb (82.6 kg)   SpO2 97% Comment: room air  BMI 30.29 kg/m  Vitals:   01/17/18 0857  BP: 127/84  Pulse: 83  Resp: 16  Temp: 98.5 F (36.9 C)  TempSrc: Oral  SpO2: 97%  Weight: 182 lb (82.6 kg)     Physical Exam  General Appearance:    Alert, cooperative, no distress  HENT:   bilateral TM normal without fluid or infection, neck without nodes, throat normal without erythema or exudate, sinuses nontender and nasal mucosa congested  Eyes:    PERRL, conjunctiva/corneas clear, EOM's intact       Lungs:     Clear to auscultation bilaterally, respirations unlabored  Heart:    Regular rate and rhythm  Neurologic:   Awake, alert, oriented x 3. No apparent focal neurological           defect.           Assessment & Plan:     1. Upper respiratory tract infection, unspecified type   2.  Cough  - benzonatate (TESSALON) 200 MG capsule; Take 1 capsule (200 mg total) by mouth 2 (two) times daily as needed for cough.  Dispense: 20 capsule; Refill: 0  Counseled regarding signs and symptoms of viral and bacterial respiratory infections. Advised to call or return for additional evaluation if he develops any sign of bacterial infection, or if current symptoms last longer than 10 days.        Lelon Huh, MD  [facility]

## 2018-01-23 ENCOUNTER — Other Ambulatory Visit: Payer: Self-pay | Admitting: Family Medicine

## 2018-01-23 DIAGNOSIS — IMO0002 Reserved for concepts with insufficient information to code with codable children: Secondary | ICD-10-CM

## 2018-01-23 DIAGNOSIS — Z794 Long term (current) use of insulin: Principal | ICD-10-CM

## 2018-01-23 DIAGNOSIS — E118 Type 2 diabetes mellitus with unspecified complications: Principal | ICD-10-CM

## 2018-01-23 DIAGNOSIS — E1165 Type 2 diabetes mellitus with hyperglycemia: Secondary | ICD-10-CM

## 2018-01-25 ENCOUNTER — Telehealth: Payer: Self-pay | Admitting: Family Medicine

## 2018-01-25 MED ORDER — HYDROCODONE-HOMATROPINE 5-1.5 MG/5ML PO SYRP: 5.0000 mL | ORAL_SOLUTION | Freq: Three times a day (TID) | ORAL | 0 refills | Status: AC | PRN

## 2018-01-25 MED ORDER — AZITHROMYCIN 250 MG PO TABS: ORAL_TABLET | ORAL | 0 refills | Status: AC

## 2018-01-25 NOTE — Telephone Encounter (Signed)
Please review. Thanks!  

## 2018-01-25 NOTE — Telephone Encounter (Signed)
Pt needing something stronger for this cough.  States he is still coughing all the time.   [facility]

## 2018-01-29 ENCOUNTER — Telehealth: Payer: Self-pay | Admitting: Family Medicine

## 2018-01-29 NOTE — Telephone Encounter (Signed)
Pt called back saying he has not heard from the office since he called on Friday.  Still coughing,  CB#  [phone]  Robert [name]

## 2018-01-29 NOTE — Telephone Encounter (Signed)
Patient advised as below.  

## 2018-01-29 NOTE — Telephone Encounter (Signed)
Prescription for zpack and cough syrup [facility] [place]

## 2018-01-30 ENCOUNTER — Other Ambulatory Visit: Payer: Self-pay

## 2018-01-30 MED ORDER — GLUCOSE BLOOD VI STRP: ORAL_STRIP | 12 refills | Status: AC

## 2018-01-30 NOTE — Telephone Encounter (Signed)
Pharmacy is requesting FREQUENCY, please add how many times patient needs to test.

## 2018-03-19 ENCOUNTER — Other Ambulatory Visit: Payer: Self-pay | Admitting: Family Medicine

## 2018-03-19 DIAGNOSIS — E7800 Pure hypercholesterolemia, unspecified: Secondary | ICD-10-CM

## 2018-04-25 ENCOUNTER — Telehealth: Payer: Self-pay | Admitting: Family Medicine

## 2018-04-25 NOTE — Telephone Encounter (Signed)
Pt states he the medication that the patient was switched to for pain is not working (pt doesn't know the name of the new medication).  Pt is wanting to switch back to 800 MG of Ibuprofen sent to [facility].

## 2018-04-25 NOTE — Telephone Encounter (Signed)
Please review

## 2018-04-26 NOTE — Telephone Encounter (Signed)
Patient was advised and scheduled appointment for 04/29/2018 @ 8:40 a.m.

## 2018-04-26 NOTE — Telephone Encounter (Signed)
Has not been seen for his uncontrolled diabetes in 6 months, he needs to schedule o.v. before any prescriptions can be refilled.

## 2018-04-29 ENCOUNTER — Ambulatory Visit: Payer: Managed Care, Other (non HMO) | Admitting: Family Medicine

## 2018-04-29 ENCOUNTER — Encounter: Payer: Self-pay | Admitting: Family Medicine

## 2018-04-29 VITALS — BP 136/80 | HR 80 | Temp 98.4°F | Resp 16 | Wt 185.0 lb

## 2018-04-29 DIAGNOSIS — Z114 Encounter for screening for human immunodeficiency virus [HIV]: Secondary | ICD-10-CM | POA: Diagnosis not present

## 2018-04-29 DIAGNOSIS — E11319 Type 2 diabetes mellitus with unspecified diabetic retinopathy without macular edema: Secondary | ICD-10-CM

## 2018-04-29 DIAGNOSIS — E1165 Type 2 diabetes mellitus with hyperglycemia: Secondary | ICD-10-CM | POA: Diagnosis not present

## 2018-04-29 DIAGNOSIS — I10 Essential (primary) hypertension: Secondary | ICD-10-CM

## 2018-04-29 DIAGNOSIS — Z125 Encounter for screening for malignant neoplasm of prostate: Secondary | ICD-10-CM

## 2018-04-29 DIAGNOSIS — Z23 Encounter for immunization: Secondary | ICD-10-CM | POA: Diagnosis not present

## 2018-04-29 DIAGNOSIS — IMO0002 Reserved for concepts with insufficient information to code with codable children: Secondary | ICD-10-CM

## 2018-04-29 LAB — POCT GLYCOSYLATED HEMOGLOBIN (HGB A1C)
Est. average glucose Bld gHb Est-mCnc: 169
Hemoglobin A1C: 7.5 % — AB (ref 4.0–5.6)

## 2018-04-29 MED ORDER — IBUPROFEN 800 MG PO TABS: ORAL_TABLET | ORAL | 3 refills | Status: DC

## 2018-04-29 NOTE — Progress Notes (Signed)
Patient: Robert [name] Male    DOB: 20-Jan-1961   57 y.o.   MRN: [id number] Visit Date: 04/29/2018  Today's Provider: Lelon Huh, MD   Chief Complaint  Patient presents with  . Hypertension  . Diabetes  . Hyperlipidemia   Subjective:     HPI    Diabetes Mellitus Type II, Follow-up:   Lab Results  Component Value Date   HGBA1C 8.0 (A) 11/13/2017   HGBA1C 11.1 08/17/2017   HGBA1C 8.2 11/23/2016   Last seen for diabetes 6 months ago.  Management since then includes No changes. He reports excellent compliance with treatment. He is not having side effects.  Current symptoms include Numbness in fingers and have been stable. Home blood sugar records: fasting range: 120's  Episodes of hypoglycemia? no   Current Insulin Regimen: Soliqua 10/33unit-MCG 56 Units daily. Most Recent Eye Exam: UTD Weight trend: stable  ------------------------------------------------------------------------   Hypertension, follow-up:  BP Readings from Last 3 Encounters:  04/29/18 136/80  01/17/18 127/84  11/13/17 104/66    He was last seen for hypertension 6 months ago.  BP at that visit was 104/66. Management since that visit includes Decreased Losartan. He reports excellent compliance with treatment. He is not having side effects.  He is exercising. He is adherent to low salt diet.   Outside blood pressures are Pt does not check is BP at home. He is experiencing none. Patient denies chest pain, dyspnea, exertional chest pressure/discomfort, fatigue, lower extremity edema, palpitations and syncope.   Cardiovascular risk factors include advanced age (older than 8 for men, 60 for women), diabetes mellitus, dyslipidemia, hypertension and male gender.  Use of agents associated with hypertension: none.   ------------------------------------------------------------------------    Lipid/Cholesterol, Follow-up:   Last seen for this 6 months ago.  Management since that visit  includes No changes.  Last Lipid Panel:    Component Value Date/Time   CHOL 145 11/14/2017 0900   TRIG 78 11/14/2017 0900   HDL 54 11/14/2017 0900   CHOLHDL 2.7 11/14/2017 0900   LDLCALC 75 11/14/2017 0900    He reports excellent compliance with treatment. He is not having side effects.   Wt Readings from Last 3 Encounters:  04/29/18 185 lb (83.9 kg)  01/17/18 182 lb (82.6 kg)  11/13/17 183 lb (83 kg)    ------------------------------------------------------------------------    No Known Allergies   Current Outpatient Medications:  .  aspirin EC 81 MG tablet, Take 81 mg by mouth daily., Disp: , Rfl:  .  diltiazem (TIAZAC) 180 MG 24 hr capsule, TAKE 1 CAPSULE BY MOUTH  DAILY, Disp: 90 capsule, Rfl: 4 .  empagliflozin (JARDIANCE) 10 MG TABS tablet, Take 10 mg by mouth daily., Disp: 90 tablet, Rfl: 2 .  glucose blood test strip, Use as instructed to check sugar three times daily for insulin dependent diabets., Disp: 100 each, Rfl: 12 .  Insulin Pen Needle (PEN NEEDLES) 31G X 5 MM MISC, 1 Units by Does not apply route 4 (four) times daily., Disp: 360 each, Rfl: 4 .  Lancets (ONETOUCH ULTRASOFT) lancets, Use as instructed, Disp: 100 each, Rfl: 12 .  losartan (COZAAR) 50 MG tablet, Take 1 tablet (50 mg total) by mouth daily. Take in place of the 171m tablets., Disp: 90 tablet, Rfl: 3 .  metFORMIN (GLUCOPHAGE) 1000 MG tablet, TAKE 1 TABLET BY MOUTH  DAILY, Disp: 90 tablet, Rfl: 4 .  naproxen (NAPROSYN) 500 MG tablet, Take 1 tablet (500 mg total) by  mouth 2 (two) times daily with a meal. Take in place of ibuprofen, Disp: 180 tablet, Rfl: 2 .  pravastatin (PRAVACHOL) 40 MG tablet, TAKE 1 TABLET BY MOUTH  DAILY, Disp: 90 tablet, Rfl: 4 .  sildenafil (VIAGRA) 100 MG tablet, Take 0.5-1 tablets (50-100 mg total) by mouth daily as needed for erectile dysfunction., Disp: 10 tablet, Rfl: 5 .  SOLIQUA 100-33 UNT-MCG/ML SOPN, INJECT SUBCUTANEOUSLY 56  UNITS AS DIRECTED DAILY., Disp: 45 mL,  Rfl: 4 .  benzonatate (TESSALON) 200 MG capsule, Take 1 capsule (200 mg total) by mouth 2 (two) times daily as needed for cough. (Patient not taking: Reported on 04/29/2018), Disp: 20 capsule, Rfl: 0 .  Blood Glucose Monitoring Suppl (ACCU-CHEK AVIVA PLUS) w/Device KIT, Use to check blood sugar three times daily for insulin dependent diabetes (E11.9) (Patient not taking: Reported on 04/29/2018), Disp: 1 kit, Rfl: 0  Review of Systems  Constitutional: Negative.   Respiratory: Negative.   Cardiovascular: Negative.   Gastrointestinal: Negative.   Endocrine: Negative.   Neurological: Positive for numbness (Pt reports some numbness in his hands.). Negative for dizziness and headaches.    Social History   Tobacco Use  . Smoking status: Never Smoker  . Smokeless tobacco: Never Used  Substance Use Topics  . Alcohol use: No    Alcohol/week: 0.0 standard drinks      Objective:   BP 136/80 (BP Location: Left Arm, Patient Position: Sitting, Cuff Size: Large)   Pulse 80   Temp 98.4 F (36.9 C) (Oral)   Resp 16   Wt 185 lb (83.9 kg)   BMI 30.79 kg/m   Physical Exam   General Appearance:    Alert, cooperative, no distress  Eyes:    PERRL, conjunctiva/corneas clear, EOM's intact       Lungs:     Clear to auscultation bilaterally, respirations unlabored  Heart:    Regular rate and rhythm  Neurologic:   Awake, alert, oriented x 3. No apparent focal neurological           defect.     Results for orders placed or performed in visit on 04/29/18  POCT glycosylated hemoglobin (Hb A1C)  Result Value Ref Range   Hemoglobin A1C 7.5 (A) 4.0 - 5.6 %   Est. average glucose Bld gHb Est-mCnc 169        Assessment & Plan    1. Essential hypertension Well controlled.  Continue current medications.   - Comprehensive metabolic panel  2. abetes mellitus with retinopathy ([place]) Improved with change to [facility]. Continue current medications.   - POCT glycosylated hemoglobin (Hb A1C)  3.  Prostate cancer screening  - PSA  4. Screening for HIV (human immunodeficiency virus)  - HIV Antibody (routine testing w rflx)  Return in about 20 weeks (around 09/16/2018).     Lelon Huh, MD  [facility]

## 2018-04-29 NOTE — Addendum Note (Signed)
Addended by: Ashley Royalty E on: 04/29/2018 03:07 PM   Modules accepted: Orders

## 2018-04-30 ENCOUNTER — Telehealth: Payer: Self-pay

## 2018-04-30 LAB — COMPREHENSIVE METABOLIC PANEL
ALBUMIN: 4.8 g/dL (ref 3.5–5.5)
ALK PHOS: 91 IU/L (ref 39–117)
ALT: 32 IU/L (ref 0–44)
AST: 24 IU/L (ref 0–40)
Albumin/Globulin Ratio: 2.2 (ref 1.2–2.2)
BILIRUBIN TOTAL: 0.3 mg/dL (ref 0.0–1.2)
BUN / CREAT RATIO: 13 (ref 9–20)
BUN: 17 mg/dL (ref 6–24)
CHLORIDE: 100 mmol/L (ref 96–106)
CO2: 21 mmol/L (ref 20–29)
Calcium: 10 mg/dL (ref 8.7–10.2)
Creatinine, Ser: 1.32 mg/dL — ABNORMAL HIGH (ref 0.76–1.27)
GFR calc Af Amer: 69 mL/min/{1.73_m2} (ref >59)
GFR calc non Af Amer: 60 mL/min/{1.73_m2} (ref >59)
GLUCOSE: 149 mg/dL — AB (ref 65–99)
Globulin, Total: 2.2 g/dL (ref 1.5–4.5)
Potassium: 4.6 mmol/L (ref 3.5–5.2)
Sodium: 138 mmol/L (ref 134–144)
Total Protein: 7 g/dL (ref 6.0–8.5)

## 2018-04-30 LAB — HIV ANTIBODY (ROUTINE TESTING W REFLEX): HIV SCREEN 4TH GENERATION: NONREACTIVE

## 2018-04-30 LAB — PSA: PROSTATE SPECIFIC AG, SERUM: 0.5 ng/mL (ref 0.0–4.0)

## 2018-04-30 NOTE — Telephone Encounter (Signed)
LMTCB 04/30/2018  Thanks,   -Mickel Baas

## 2018-04-30 NOTE — Telephone Encounter (Signed)
Pt advised.   Thanks,   -Laura  

## 2018-04-30 NOTE — Telephone Encounter (Signed)
-----   Message from Birdie Sons, MD sent at 04/30/2018  7:50 AM EST ----- Kidney functions stable. Normal psa. Continue current medications.  Follow up in may as scheduled.

## 2018-06-11 ENCOUNTER — Ambulatory Visit: Payer: Managed Care, Other (non HMO) | Admitting: Family Medicine

## 2018-06-12 ENCOUNTER — Encounter: Payer: Self-pay | Admitting: Family Medicine

## 2018-06-12 ENCOUNTER — Ambulatory Visit: Payer: Managed Care, Other (non HMO) | Admitting: Family Medicine

## 2018-06-12 VITALS — BP 145/80 | HR 89 | Temp 97.9°F | Resp 18 | Wt 184.0 lb

## 2018-06-12 DIAGNOSIS — R05 Cough: Secondary | ICD-10-CM

## 2018-06-12 DIAGNOSIS — R059 Cough, unspecified: Secondary | ICD-10-CM

## 2018-06-12 MED ORDER — BENZONATATE 200 MG PO CAPS: 200.0000 mg | ORAL_CAPSULE | Freq: Three times a day (TID) | ORAL | 0 refills | Status: AC | PRN

## 2018-06-12 MED ORDER — DOXYCYCLINE HYCLATE 100 MG PO TABS: 100.0000 mg | ORAL_TABLET | Freq: Two times a day (BID) | ORAL | 0 refills | Status: DC

## 2018-06-12 NOTE — Progress Notes (Signed)
Patient: Robert [name] Male    DOB: [birth year]   58 y.o.   MRN: [id number] Visit Date: 06/12/2018  Today's Provider: Lelon Huh, MD   Chief Complaint  Patient presents with  . Cough    x 2 weeks   Subjective:     Cough  This is a new problem. Episode onset: 2 weeks ago. The problem has been gradually improving. The cough is non-productive. Pertinent negatives include no chest pain, chills, ear congestion, ear pain, fever, headaches, hemoptysis, myalgias, nasal congestion, postnasal drip, rhinorrhea, sore throat, shortness of breath, sweats or wheezing. Treatments tried: OTC Cold and Flu medication.    No Known Allergies   Current Outpatient Medications:  .  aspirin EC 81 MG tablet, Take 81 mg by mouth daily., Disp: , Rfl:  .  benzonatate (TESSALON) 200 MG capsule, Take 1 capsule (200 mg total) by mouth 2 (two) times daily as needed for cough., Disp: 20 capsule, Rfl: 0 .  Blood Glucose Monitoring Suppl (ACCU-CHEK AVIVA PLUS) w/Device KIT, Use to check blood sugar three times daily for insulin dependent diabetes (E11.9), Disp: 1 kit, Rfl: 0 .  diltiazem (TIAZAC) 180 MG 24 hr capsule, TAKE 1 CAPSULE BY MOUTH  DAILY, Disp: 90 capsule, Rfl: 4 .  empagliflozin (JARDIANCE) 10 MG TABS tablet, Take 10 mg by mouth daily., Disp: 90 tablet, Rfl: 2 .  glucose blood test strip, Use as instructed to check sugar three times daily for insulin dependent diabets., Disp: 100 each, Rfl: 12 .  ibuprofen (ADVIL,MOTRIN) 800 MG tablet, Take 1 tablet by mouth twice a day as needed., Disp: 180 tablet, Rfl: 3 .  Insulin Pen Needle (PEN NEEDLES) 31G X 5 MM MISC, 1 Units by Does not apply route 4 (four) times daily., Disp: 360 each, Rfl: 4 .  Lancets (ONETOUCH ULTRASOFT) lancets, Use as instructed, Disp: 100 each, Rfl: 12 .  losartan (COZAAR) 50 MG tablet, Take 1 tablet (50 mg total) by mouth daily. Take in place of the 165m tablets., Disp: 90 tablet, Rfl: 3 .  metFORMIN (GLUCOPHAGE) 1000 MG tablet,  TAKE 1 TABLET BY MOUTH  DAILY, Disp: 90 tablet, Rfl: 4 .  pravastatin (PRAVACHOL) 40 MG tablet, TAKE 1 TABLET BY MOUTH  DAILY, Disp: 90 tablet, Rfl: 4 .  sildenafil (VIAGRA) 100 MG tablet, Take 0.5-1 tablets (50-100 mg total) by mouth daily as needed for erectile dysfunction., Disp: 10 tablet, Rfl: 5 .  SOLIQUA 100-33 UNT-MCG/ML SOPN, INJECT SUBCUTANEOUSLY 56  UNITS AS DIRECTED DAILY., Disp: 45 mL, Rfl: 4  Review of Systems  Constitutional: Negative for chills and fever.  HENT: Negative for ear pain, postnasal drip, rhinorrhea and sore throat.   Respiratory: Positive for cough. Negative for hemoptysis, shortness of breath and wheezing.   Cardiovascular: Negative for chest pain.  Musculoskeletal: Negative for myalgias.  Neurological: Negative for headaches.    Social History   Tobacco Use  . Smoking status: Never Smoker  . Smokeless tobacco: Never Used  Substance Use Topics  . Alcohol use: No    Alcohol/week: 0.0 standard drinks      Objective:   BP (!) 145/80 (BP Location: Right Arm, Cuff Size: Large)   Pulse 89   Temp 97.9 F (36.6 C) (Oral)   Resp 18   Wt 184 lb (83.5 kg)   SpO2 97% Comment: room air  BMI 30.62 kg/m  Vitals:   06/12/18 0948 06/12/18 0953  BP: (!) 149/82 (!) 145/80  Pulse: 89  Resp: 18   Temp: 97.9 F (36.6 C)   TempSrc: Oral   SpO2: 97%   Weight: 184 lb (83.5 kg)      Physical Exam  General Appearance:    Alert, cooperative, no distress  HENT:   ENT exam normal, no neck nodes or sinus tenderness  Eyes:    PERRL, conjunctiva/corneas clear, EOM's intact       Lungs:     Clear to auscultation bilaterally, respirations unlabored  Heart:    Regular rate and rhythm  Neurologic:   Awake, alert, oriented x 3. No apparent focal neurological           defect.          Assessment & Plan    1. Cough  - doxycycline (VIBRA-TABS) 100 MG tablet; Take 1 tablet (100 mg total) by mouth 2 (two) times daily.  Dispense: 20 tablet; Refill: 0 - benzonatate  (TESSALON) 200 MG capsule; Take 1 capsule (200 mg total) by mouth 3 (three) times daily as needed for up to 7 days for cough.  Dispense: 20 capsule; Refill: 0  Call if symptoms change or if not rapidly improving.        Lelon Huh, MD  [facility]

## 2018-06-14 ENCOUNTER — Telehealth: Payer: Self-pay | Admitting: Family Medicine

## 2018-06-14 MED ORDER — HYDROCODONE-HOMATROPINE 5-1.5 MG/5ML PO SYRP: 5.0000 mL | ORAL_SOLUTION | Freq: Three times a day (TID) | ORAL | 0 refills | Status: DC | PRN

## 2018-06-14 NOTE — Telephone Encounter (Signed)
Pt is doing worse since visit with Fisher.  Runny nose - coughing - congested all day now.  Please advise.  Thanks, [employer]

## 2018-06-14 NOTE — Telephone Encounter (Signed)
Patient has been advised he states that he needs something to help with cough. Patient states that you had prescribed Tessalon before and states that is not helping with his symptoms. Patient is wanting to know if you can call in prescription cough syrup? Sent to [facility] [place].  KW

## 2018-06-14 NOTE — Telephone Encounter (Signed)
Patient calling office back again to inquire about [email or web address]

## 2018-06-14 NOTE — Telephone Encounter (Signed)
If he has any fever, chest pain, or shortness of breath, then go to ER. He can take OTC Coricidin for runny nose and congestion. Otherwise, sx are all due to cold virus and will resolve in 4-5 more days. There aren't any other medications to get over it faster.

## 2018-07-12 ENCOUNTER — Other Ambulatory Visit: Payer: Self-pay | Admitting: Family Medicine

## 2018-07-12 DIAGNOSIS — E118 Type 2 diabetes mellitus with unspecified complications: Principal | ICD-10-CM

## 2018-07-12 DIAGNOSIS — E1165 Type 2 diabetes mellitus with hyperglycemia: Secondary | ICD-10-CM

## 2018-07-12 DIAGNOSIS — IMO0002 Reserved for concepts with insufficient information to code with codable children: Secondary | ICD-10-CM

## 2018-07-12 DIAGNOSIS — Z794 Long term (current) use of insulin: Principal | ICD-10-CM

## 2018-08-08 ENCOUNTER — Other Ambulatory Visit: Payer: Self-pay | Admitting: Family Medicine

## 2018-09-04 ENCOUNTER — Other Ambulatory Visit: Payer: Self-pay | Admitting: Family Medicine

## 2018-09-04 DIAGNOSIS — E1165 Type 2 diabetes mellitus with hyperglycemia: Secondary | ICD-10-CM

## 2018-09-04 DIAGNOSIS — E118 Type 2 diabetes mellitus with unspecified complications: Principal | ICD-10-CM

## 2018-09-04 DIAGNOSIS — IMO0002 Reserved for concepts with insufficient information to code with codable children: Secondary | ICD-10-CM

## 2018-09-04 DIAGNOSIS — Z794 Long term (current) use of insulin: Principal | ICD-10-CM

## 2018-09-28 ENCOUNTER — Other Ambulatory Visit: Payer: Self-pay | Admitting: Family Medicine

## 2018-09-30 ENCOUNTER — Ambulatory Visit: Payer: Managed Care, Other (non HMO) | Admitting: Family Medicine

## 2018-10-11 ENCOUNTER — Ambulatory Visit: Payer: Managed Care, Other (non HMO) | Admitting: Family Medicine

## 2018-11-04 ENCOUNTER — Ambulatory Visit: Payer: Managed Care, Other (non HMO) | Admitting: Family Medicine

## 2018-11-04 ENCOUNTER — Other Ambulatory Visit: Payer: Self-pay

## 2018-11-04 ENCOUNTER — Encounter: Payer: Self-pay | Admitting: Family Medicine

## 2018-11-04 VITALS — BP 130/60 | HR 82 | Temp 99.0°F | Resp 16 | Wt 182.0 lb

## 2018-11-04 DIAGNOSIS — E118 Type 2 diabetes mellitus with unspecified complications: Secondary | ICD-10-CM | POA: Diagnosis not present

## 2018-11-04 DIAGNOSIS — Z794 Long term (current) use of insulin: Secondary | ICD-10-CM

## 2018-11-04 DIAGNOSIS — E1165 Type 2 diabetes mellitus with hyperglycemia: Secondary | ICD-10-CM

## 2018-11-04 DIAGNOSIS — IMO0002 Reserved for concepts with insufficient information to code with codable children: Secondary | ICD-10-CM

## 2018-11-04 LAB — POCT GLYCOSYLATED HEMOGLOBIN (HGB A1C)
Est. average glucose Bld gHb Est-mCnc: 180
Hemoglobin A1C: 7.9 % — AB (ref 4.0–5.6)

## 2018-11-04 MED ORDER — SOLIQUA 100-33 UNT-MCG/ML ~~LOC~~ SOPN: 60.0000 [IU] | PEN_INJECTOR | Freq: Every day | SUBCUTANEOUS | 4 refills | Status: DC

## 2018-11-04 NOTE — Progress Notes (Signed)
Patient: Robert [name] Male    DOB: [birth year]   58 y.o.   MRN: [id number] Visit Date: 11/04/2018  Today's Provider: Lelon Huh, MD   Chief Complaint  Patient presents with  . Diabetes  . Hyperlipidemia  . Hypertension   Subjective:     HPI  Diabetes Mellitus Type II, Follow-up:   Lab Results  Component Value Date   HGBA1C 7.5 (A) 04/29/2018   HGBA1C 8.0 (A) 11/13/2017   HGBA1C 11.1 08/17/2017   Last seen for diabetes 6 months ago.  Management since then includes no changes. He reports good compliance with treatment. He is not having side effects.  Current symptoms include none and have been stable. Home blood sugar records: blood sugars are not checked at home  Episodes of hypoglycemia? no   Current Insulin Regimen: none Most Recent Eye Exam: 1 year ago Weight trend: fluctuating a bit Prior visit with dietician: no Current diet: well balanced Current exercise: walking  ------------------------------------------------------------------------   Hypertension, follow-up:  BP Readings from Last 3 Encounters:  11/04/18 130/60  06/12/18 (!) 145/80  04/29/18 136/80    He was last seen for hypertension 6 months ago.  BP at that visit was 136/80. Management since that visit includes no changes.He reports good compliance with treatment. He is not having side effects.  He is exercising. He is not adherent to low salt diet.   Outside blood pressures are not checked. He is experiencing none.  Patient denies chest pain, chest pressure/discomfort, claudication, dyspnea, exertional chest pressure/discomfort, fatigue, irregular heart beat, lower extremity edema, near-syncope, orthopnea, palpitations, paroxysmal nocturnal dyspnea, syncope and tachypnea.   Cardiovascular risk factors include advanced age (older than 70 for men, 68 for women), diabetes mellitus, dyslipidemia, hypertension and male gender.  Use of agents associated with hypertension: NSAIDS.    ------------------------------------------------------------------------    Lipid/Cholesterol, Follow-up:   Last seen for this 6 months ago.  Management since that visit includes no changes.  Last Lipid Panel:    Component Value Date/Time   CHOL 145 11/14/2017 0900   TRIG 78 11/14/2017 0900   HDL 54 11/14/2017 0900   CHOLHDL 2.7 11/14/2017 0900   LDLCALC 75 11/14/2017 0900    He reports good compliance with treatment. He is not having side effects.   Wt Readings from Last 3 Encounters:  11/04/18 182 lb (82.6 kg)  06/12/18 184 lb (83.5 kg)  04/29/18 185 lb (83.9 kg)   7.9 ON 11/04/2018  ------------------------------------------------------------------------  No Known Allergies   Current Outpatient Medications:  .  aspirin EC 81 MG tablet, Take 81 mg by mouth daily., Disp: , Rfl:  .  B-D UF III MINI PEN NEEDLES 31G X 5 MM MISC, USE 4 TIMES DAILY, Disp: 360 each, Rfl: 4 .  Blood Glucose Monitoring Suppl (ACCU-CHEK AVIVA PLUS) w/Device KIT, Use to check blood sugar three times daily for insulin dependent diabetes (E11.9), Disp: 1 kit, Rfl: 0 .  diltiazem (TIAZAC) 180 MG 24 hr capsule, TAKE 1 CAPSULE BY MOUTH  DAILY, Disp: 90 capsule, Rfl: 4 .  glucose blood test strip, Use as instructed to check sugar three times daily for insulin dependent diabets., Disp: 100 each, Rfl: 12 .  ibuprofen (ADVIL,MOTRIN) 800 MG tablet, Take 1 tablet by mouth twice a day as needed., Disp: 180 tablet, Rfl: 3 .  JARDIANCE 10 MG TABS tablet, TAKE 1 TABLET BY MOUTH  DAILY, Disp: 90 tablet, Rfl: 4 .  Lancets (ONETOUCH ULTRASOFT) lancets, Use as  instructed, Disp: 100 each, Rfl: 12 .  losartan (COZAAR) 50 MG tablet, TAKE 1 TABLET BY MOUTH  DAILY, Disp: 90 tablet, Rfl: 3 .  metFORMIN (GLUCOPHAGE) 1000 MG tablet, TAKE 1 TABLET BY MOUTH  DAILY, Disp: 90 tablet, Rfl: 4 .  pravastatin (PRAVACHOL) 40 MG tablet, TAKE 1 TABLET BY MOUTH  DAILY, Disp: 90 tablet, Rfl: 4 .  sildenafil (VIAGRA) 100 MG tablet, Take  0.5-1 tablets (50-100 mg total) by mouth daily as needed for erectile dysfunction., Disp: 10 tablet, Rfl: 5 .  SOLIQUA 100-33 UNT-MCG/ML SOPN, INJECT SUBCUTANEOUSLY 56  UNITS AS DIRECTED DAILY., Disp: 45 mL, Rfl: 4  Review of Systems  Social History   Tobacco Use  . Smoking status: Never Smoker  . Smokeless tobacco: Never Used  Substance Use Topics  . Alcohol use: No    Alcohol/week: 0.0 standard drinks      Objective:   BP 130/60 (BP Location: Left Arm, Patient Position: Sitting, Cuff Size: Large)   Pulse 82   Temp 99 F (37.2 C) (Oral)   Resp 16   Wt 182 lb (82.6 kg)   SpO2 97% Comment: room air  BMI 30.29 kg/m  Vitals:   11/04/18 1536  BP: 130/60  Pulse: 82  Resp: 16  Temp: 99 F (37.2 C)  TempSrc: Oral  SpO2: 97%  Weight: 182 lb (82.6 kg)     Physical Exam    General Appearance:    Alert, cooperative, no distress  Eyes:    PERRL, conjunctiva/corneas clear, EOM's intact       Lungs:     Clear to auscultation bilaterally, respirations unlabored  Heart:    Regular rate and rhythm  Neurologic:   Awake, alert, oriented x 3. No apparent focal neurological           defect.       Results for orders placed or performed in visit on 11/04/18  POCT HgB A1C  Result Value Ref Range   Hemoglobin A1C 7.9 (A) 4.0 - 5.6 %   HbA1c POC (<> result, manual entry)     HbA1c, POC (prediabetic range)     HbA1c, POC (controlled diabetic range)     Est. average glucose Bld gHb Est-mCnc 180        Assessment & Plan    1. Uncontrolled type 2 diabetes mellitus with complication, with long-term current use of insulin (HCC) A1C up a bit. increase- Insulin Glargine-Lixisenatide (SOLIQUA) 100-33 UNT-MCG/ML SOPN; to 60 Units as directed daily.  Dispense: 45 mL; Refill: 4  Future Appointments  Date Time Provider Hunter  03/10/2019  3:20 PM Birdie Sons, MD [facility] None       The entirety of the information documented in the History of Present Illness, Review  of Systems and Physical Exam were personally obtained by me. Portions of this information were initially documented by Meyer Cory, CMA and reviewed by me for thoroughness and accuracy.   Lelon Huh, MD  [facility]

## 2018-11-04 NOTE — Patient Instructions (Addendum)
.   Please review the attached list of medications and notify my office if there are any errors.    Please bring all of your medications to every appointment so we can make sure that our medication list is the same as yours.    Increase Soliqua to 60 units every day

## 2018-12-03 ENCOUNTER — Other Ambulatory Visit: Payer: Self-pay | Admitting: Family Medicine

## 2018-12-03 DIAGNOSIS — N529 Male erectile dysfunction, unspecified: Secondary | ICD-10-CM

## 2018-12-03 MED ORDER — SILDENAFIL CITRATE 100 MG PO TABS: 50.0000 mg | ORAL_TABLET | Freq: Every day | ORAL | 5 refills | Status: DC | PRN

## 2018-12-03 NOTE — Telephone Encounter (Signed)
Pt contacted office for refill request on the following medications:  sildenafil (VIAGRA) 100 MG tablet   [facility]  Last Rx: 08/2017 Please advise. Thanks TNP

## 2019-01-15 NOTE — Progress Notes (Signed)
Patient: Robert [name] Male    DOB: [birth year]   58 y.o.   MRN: [id number] Visit Date: 01/16/2019  Today's Provider: Mar Daring, PA-C   Chief Complaint  Patient presents with  . Back Pain   Subjective:     Back Pain This is a new problem. The current episode started 1 to 4 weeks ago (2 weeks). The problem occurs constantly. The problem has been gradually worsening since onset. The pain is present in the lumbar spine. The quality of the pain is described as aching. Radiates to: radiates under his right arm. The pain is at a severity of 3/10. The pain is mild. The pain is worse during the day. The symptoms are aggravated by position, bending, coughing and twisting. Stiffness is present at night and all day. Pertinent negatives include no abdominal pain, bladder incontinence, bowel incontinence, chest pain, dysuria, fever, headaches, leg pain, numbness, paresis, paresthesias, pelvic pain, perianal numbness, tingling, weakness or weight loss. He has tried NSAIDs for the symptoms. The treatment provided no relief.    Patient states that his back began hurting about 2 weeks ago. Patient states he may have been increasing his exercise regimen and hurt or strained his back. Pain is located in the right flank area and will radiate under his right arm occasionally. Patient states pain is intermittent and worsens during the day. Patient has been taking ibuprofen with mild to no relief. He reports he is only taking 200-42m once daily.   No Known Allergies   Current Outpatient Medications:  .  aspirin EC 81 MG tablet, Take 81 mg by mouth daily., Disp: , Rfl:  .  B-D UF III MINI PEN NEEDLES 31G X 5 MM MISC, USE 4 TIMES DAILY, Disp: 360 each, Rfl: 4 .  Blood Glucose Monitoring Suppl (ACCU-CHEK AVIVA PLUS) w/Device KIT, Use to check blood sugar three times daily for insulin dependent diabetes (E11.9), Disp: 1 kit, Rfl: 0 .  diltiazem (TIAZAC) 180 MG 24 hr capsule, TAKE 1 CAPSULE BY MOUTH   DAILY, Disp: 90 capsule, Rfl: 4 .  glucose blood test strip, Use as instructed to check sugar three times daily for insulin dependent diabets., Disp: 100 each, Rfl: 12 .  ibuprofen (ADVIL,MOTRIN) 800 MG tablet, Take 1 tablet by mouth twice a day as needed., Disp: 180 tablet, Rfl: 3 .  Insulin Glargine-Lixisenatide (SOLIQUA) 100-33 UNT-MCG/ML SOPN, Inject 60 Units as directed daily., Disp: 45 mL, Rfl: 4 .  JARDIANCE 10 MG TABS tablet, TAKE 1 TABLET BY MOUTH  DAILY, Disp: 90 tablet, Rfl: 4 .  Lancets (ONETOUCH ULTRASOFT) lancets, Use as instructed, Disp: 100 each, Rfl: 12 .  losartan (COZAAR) 50 MG tablet, TAKE 1 TABLET BY MOUTH  DAILY, Disp: 90 tablet, Rfl: 3 .  metFORMIN (GLUCOPHAGE) 1000 MG tablet, TAKE 1 TABLET BY MOUTH  DAILY, Disp: 90 tablet, Rfl: 4 .  pravastatin (PRAVACHOL) 40 MG tablet, TAKE 1 TABLET BY MOUTH  DAILY, Disp: 90 tablet, Rfl: 4 .  sildenafil (VIAGRA) 100 MG tablet, Take 0.5-1 tablets (50-100 mg total) by mouth daily as needed for erectile dysfunction., Disp: 10 tablet, Rfl: 5  Review of Systems  Constitutional: Negative for appetite change, chills, fever and weight loss.  Respiratory: Negative for chest tightness, shortness of breath and wheezing.   Cardiovascular: Negative for chest pain and palpitations.  Gastrointestinal: Negative for abdominal pain, bowel incontinence, nausea and vomiting.  Genitourinary: Negative for bladder incontinence, dysuria and pelvic pain.  Musculoskeletal: Positive  for back pain.  Neurological: Negative for tingling, weakness, numbness, headaches and paresthesias.    Social History   Tobacco Use  . Smoking status: Never Smoker  . Smokeless tobacco: Never Used  Substance Use Topics  . Alcohol use: No    Alcohol/week: 0.0 standard drinks      Objective:   BP 137/81 (BP Location: Left Arm, Patient Position: Sitting, Cuff Size: Large)   Pulse 72   Temp (!) 96.6 F (35.9 C) (Oral)   Resp 16   Ht '5\' 5"'  (1.651 m)   Wt 182 lb (82.6 kg)    SpO2 97%   BMI 30.29 kg/m  Vitals:   01/16/19 0836  BP: 137/81  Pulse: 72  Resp: 16  Temp: (!) 96.6 F (35.9 C)  TempSrc: Oral  SpO2: 97%  Weight: 182 lb (82.6 kg)  Height: '5\' 5"'  (1.651 m)  Body mass index is 30.29 kg/m.   Physical Exam Vitals signs reviewed.  Constitutional:      General: He is not in acute distress.    Appearance: Normal appearance. He is well-developed. He is not ill-appearing or diaphoretic.  HENT:     Head: Normocephalic and atraumatic.  Neck:     Musculoskeletal: Normal range of motion and neck supple.  Cardiovascular:     Rate and Rhythm: Normal rate and regular rhythm.     Heart sounds: Normal heart sounds. No murmur. No friction rub. No gallop.   Pulmonary:     Effort: Pulmonary effort is normal. No respiratory distress.     Breath sounds: Normal breath sounds. No wheezing or rales.  Musculoskeletal:     Cervical back: Normal.     Thoracic back: He exhibits tenderness. He exhibits normal range of motion, no bony tenderness, no pain, no spasm and normal pulse.     Lumbar back: Normal.       Back:  Neurological:     Mental Status: He is alert.      No results found for any visits on 01/16/19.     Assessment & Plan    1. Acute right-sided back pain, unspecified back location UA essentially unremarkable. Changes noted are secondary to medications for diabetes.  - POCT urinalysis dipstick  2. Strain of latissimus dorsi muscle, initial encounter Suspect muscle strain. Will treat with meloxicam and baclofen as below. Back exercises printed for patient. Apply moist heat. Epsom salt soaks can help as well. Call if not improving or if symptoms worsen.  - baclofen (LIORESAL) 10 MG tablet; Take 1 tablet (10 mg total) by mouth 3 (three) times daily.  Dispense: 30 each; Refill: 0 - meloxicam (MOBIC) 15 MG tablet; Take 1 tablet (15 mg total) by mouth daily.  Dispense: 30 tablet; Refill: 0     Mar Daring, PA-C  [facility]

## 2019-01-15 NOTE — Progress Notes (Deleted)
Patient: Robert [name] Male    DOB: June 12, 1960   58 y.o.   MRN: [id number] Visit Date: 01/15/2019  Today's Provider: Mar Daring, PA-C   No chief complaint on file.  Subjective:     Back Pain Pertinent negatives include no abdominal pain, chest pain or fever.    No Known Allergies   Current Outpatient Medications:  .  aspirin EC 81 MG tablet, Take 81 mg by mouth daily., Disp: , Rfl:  .  B-D UF III MINI PEN NEEDLES 31G X 5 MM MISC, USE 4 TIMES DAILY, Disp: 360 each, Rfl: 4 .  Blood Glucose Monitoring Suppl (ACCU-CHEK AVIVA PLUS) w/Device KIT, Use to check blood sugar three times daily for insulin dependent diabetes (E11.9), Disp: 1 kit, Rfl: 0 .  diltiazem (TIAZAC) 180 MG 24 hr capsule, TAKE 1 CAPSULE BY MOUTH  DAILY, Disp: 90 capsule, Rfl: 4 .  glucose blood test strip, Use as instructed to check sugar three times daily for insulin dependent diabets., Disp: 100 each, Rfl: 12 .  ibuprofen (ADVIL,MOTRIN) 800 MG tablet, Take 1 tablet by mouth twice a day as needed., Disp: 180 tablet, Rfl: 3 .  Insulin Glargine-Lixisenatide (SOLIQUA) 100-33 UNT-MCG/ML SOPN, Inject 60 Units as directed daily., Disp: 45 mL, Rfl: 4 .  JARDIANCE 10 MG TABS tablet, TAKE 1 TABLET BY MOUTH  DAILY, Disp: 90 tablet, Rfl: 4 .  Lancets (ONETOUCH ULTRASOFT) lancets, Use as instructed, Disp: 100 each, Rfl: 12 .  losartan (COZAAR) 50 MG tablet, TAKE 1 TABLET BY MOUTH  DAILY, Disp: 90 tablet, Rfl: 3 .  metFORMIN (GLUCOPHAGE) 1000 MG tablet, TAKE 1 TABLET BY MOUTH  DAILY, Disp: 90 tablet, Rfl: 4 .  pravastatin (PRAVACHOL) 40 MG tablet, TAKE 1 TABLET BY MOUTH  DAILY, Disp: 90 tablet, Rfl: 4 .  sildenafil (VIAGRA) 100 MG tablet, Take 0.5-1 tablets (50-100 mg total) by mouth daily as needed for erectile dysfunction., Disp: 10 tablet, Rfl: 5  Review of Systems  Constitutional: Negative for appetite change, chills and fever.  Respiratory: Negative for chest tightness, shortness of breath and wheezing.    Cardiovascular: Negative for chest pain and palpitations.  Gastrointestinal: Negative for abdominal pain, nausea and vomiting.  Musculoskeletal: Positive for back pain.    Social History   Tobacco Use  . Smoking status: Never Smoker  . Smokeless tobacco: Never Used  Substance Use Topics  . Alcohol use: No    Alcohol/week: 0.0 standard drinks      Objective:   There were no vitals taken for this visit. There were no vitals filed for this visit.There is no height or weight on file to calculate BMI.   Physical Exam   No results found for any visits on 01/16/19.     Assessment & [place], PA-C  [facility]

## 2019-01-16 ENCOUNTER — Ambulatory Visit: Payer: Managed Care, Other (non HMO) | Admitting: Physician Assistant

## 2019-01-16 ENCOUNTER — Encounter: Payer: Self-pay | Admitting: Physician Assistant

## 2019-01-16 ENCOUNTER — Other Ambulatory Visit: Payer: Self-pay

## 2019-01-16 VITALS — BP 137/81 | HR 72 | Temp 96.6°F | Resp 16 | Ht 65.0 in | Wt 182.0 lb

## 2019-01-16 DIAGNOSIS — S29012A Strain of muscle and tendon of back wall of thorax, initial encounter: Secondary | ICD-10-CM

## 2019-01-16 DIAGNOSIS — M549 Dorsalgia, unspecified: Secondary | ICD-10-CM

## 2019-01-16 LAB — POCT URINALYSIS DIPSTICK
Appearance: ABNORMAL
Bilirubin, UA: NEGATIVE
Blood, UA: NEGATIVE
Glucose, UA: POSITIVE — AB
Leukocytes, UA: NEGATIVE
Nitrite, UA: NEGATIVE
Odor: ABNORMAL
Protein, UA: POSITIVE — AB
Spec Grav, UA: 1.02 (ref 1.010–1.025)
Urobilinogen, UA: 0.2 E.U./dL
pH, UA: 6 (ref 5.0–8.0)

## 2019-01-16 MED ORDER — BACLOFEN 10 MG PO TABS: 10.0000 mg | ORAL_TABLET | Freq: Three times a day (TID) | ORAL | 0 refills | Status: AC

## 2019-01-16 MED ORDER — MELOXICAM 15 MG PO TABS: 15.0000 mg | ORAL_TABLET | Freq: Every day | ORAL | 0 refills | Status: DC

## 2019-01-16 NOTE — Patient Instructions (Signed)
Thoracic Strain Rehab Ask your health care provider which exercises are safe for you. Do exercises exactly as told by your health care provider and adjust them as directed. It is normal to feel mild stretching, pulling, tightness, or discomfort as you do these exercises. Stop right away if you feel sudden pain or your pain gets worse. Do not begin these exercises until told by your health care provider. Stretching and range-of-motion exercise This exercise warms up your muscles and joints and improves the movement and flexibility of your back and shoulders. This exercise also helps to relieve pain. Chest and spine stretch  1. Lie down on your back on a firm surface. 2. Roll a towel or a small blanket so it is about 4 inches (10 cm) in diameter. 3. Put the towel lengthwise under the middle of your back so it is under your spine, but not under your shoulder blades. 4. Put your hands behind your head and let your elbows fall to your sides. This will increase your stretch. 5. Take a deep breath (inhale). 6. Hold for __________ seconds. 7. Relax after you breathe out (exhale). Repeat __________ times. Complete this exercise __________ times a day. Strengthening exercises These exercises build strength and endurance in your back and your shoulder blade muscles. Endurance is the ability to use your muscles for a long time, even after they get tired. Alternating arm and leg raises  1. Get on your hands and knees on a firm surface. If you are on a hard floor, you may want to use padding, such as an exercise mat, to cushion your knees. 2. Line up your arms and legs. Your hands should be directly below your shoulders, and your knees should be directly below your hips. 3. Lift your left leg behind you. At the same time, raise your right arm and straighten it in front of you. ? Do not lift your leg higher than your hip. ? Do not lift your arm higher than your shoulder. ? Keep your abdominal and back  muscles tight. ? Keep your hips facing the ground. ? Do not arch your back. ? Keep your balance carefully, and do not hold your breath. 4. Hold for __________ seconds. 5. Slowly return to the starting position and repeat with your right leg and your left arm. Repeat __________ times. Complete this exercise __________ times a day. Straight arm rows This exercise is also called shoulder extension exercise. 1. Stand with your feet shoulder width apart. 2. Secure an exercise band to a stable object in front of you so the band is at or above shoulder height. 3. Hold one end of the exercise band in each hand. 4. Straighten your elbows and lift your hands up to shoulder height. 5. Step back, away from the secured end of the exercise band, until the band stretches. 6. Squeeze your shoulder blades together and pull your hands down to the sides of your thighs. Stop when your hands are straight down by your sides. This is shoulder extension. Do not let your hands go behind your body. 7. Hold for __________ seconds. 8. Slowly return to the starting position. Repeat __________ times. Complete this exercise __________ times a day. Prone shoulder external rotation 1. Lie on your abdomen on a firm bed so your left / right forearm hangs over the edge of the bed and your upper arm is on the bed, straight out from your body. This is the prone position. ? Your elbow should be bent. ?   Your palm should be facing your feet. 2. If instructed, hold a __________ weight in your hand. 3. Squeeze your shoulder blade toward the middle of your back. Do not let your shoulder lift toward your ear. 4. Keep your elbow bent in a 90-degree angle (right angle) while you slowly move your forearm up toward the ceiling. Move your forearm up to the height of the bed, toward your head. This is external rotation. ? Your upper arm should not move. ? At the top of the movement, your palm should face the floor. 5. Hold for __________  seconds. 6. Slowly return to the starting position and relax your muscles. Repeat __________ times. Complete this exercise __________ times a day. Rowing scapular retraction This is an exercise in which the shoulder blades (scapulae) are pulled toward each other (retraction). 1. Sit in a stable chair without armrests, or stand up. 2. Secure an exercise band to a stable object in front of you so the band is at shoulder height. 3. Hold one end of the exercise band in each hand. Your palms should face down. 4. Bring your arms out straight in front of you. 5. Step back, away from the secured end of the exercise band, until the band stretches. 6. Pull the band backward. As you do this, bend your elbows and squeeze your shoulder blades together, but avoid letting the rest of your body move. Do not shrug your shoulders upward while you do this. 7. Stop when your elbows are at your sides or slightly behind your body. 8. Hold for __________ seconds. 9. Slowly straighten your arms to return to the starting position. Repeat __________ times. Complete this exercise __________ times a day. Posture and body mechanics Good posture and healthy body mechanics can help to relieve stress in your body's tissues and joints. Body mechanics refers to the movements and positions of your body while you do your daily activities. Posture is part of body mechanics. Good posture means:  Your spine is in its natural S-curve position (neutral).  Your shoulders are pulled back slightly.  Your head is not tipped forward. Follow these guidelines to improve your posture and body mechanics in your everyday activities. Standing   When standing, keep your spine neutral and your feet about hip width apart. Keep a slight bend in your knees. Your ears, shoulders, and hips should line up with each other.  When you do a task in which you lean forward while standing in one place for a long time, place one foot up on a stable  object that is 2-4 inches (5-10 cm) high, such as a footstool. This helps keep your spine neutral. Sitting   When sitting, keep your spine neutral and keep your feet flat on the floor. Use a footrest, if necessary, and keep your thighs parallel to the floor. Avoid rounding your shoulders, and avoid tilting your head forward.  When working at a desk or a computer, keep your desk at a height where your hands are slightly lower than your elbows. Slide your chair under your desk so you are close enough to maintain good posture.  When working at a computer, place your monitor at a height where you are looking straight ahead and you do not have to tilt your head forward or downward to look at the screen. Resting When lying down and resting, avoid positions that are most painful for you.  If you have pain with activities such as sitting, bending, stooping, or squatting (flexion-basedactivities), lie   in a position in which your body does not bend very much. For example, avoid curling up on your side with your arms and knees near your chest (fetal position).  If you have pain with activities such as standing for a long time or reaching with your arms (extension-basedactivities), lie with your spine in a neutral position and bend your knees slightly. Try the following positions: ? Lie on your side with a pillow between your knees. ? Lie on your back with a pillow under your knees.  Lifting   When lifting objects, keep your feet at least shoulder width apart and tighten your abdominal muscles.  Bend your knees and hips and keep your spine neutral. It is important to lift using the strength of your legs, not your back. Do not lock your knees straight out.  Always ask for help to lift heavy or awkward objects. This information is not intended to replace advice given to you by your health care provider. Make sure you discuss any questions you have with your health care provider. Document Released:  05/01/2005 Document Revised: 08/23/2018 Document Reviewed: 06/10/2018 Elsevier Patient Education  2020 [employer]  

## 2019-01-22 LAB — HM DIABETES EYE EXAM

## 2019-01-23 ENCOUNTER — Telehealth: Payer: Self-pay | Admitting: Family Medicine

## 2019-01-23 DIAGNOSIS — S29012D Strain of muscle and tendon of back wall of thorax, subsequent encounter: Secondary | ICD-10-CM

## 2019-01-23 NOTE — Telephone Encounter (Signed)
Please advise 

## 2019-01-23 NOTE — Telephone Encounter (Signed)
We can try different medication or try the steroid like I discussed with him. If we do the steroid he may may need to increase his Soliqua insulin dose by 5-10 units while on it.  What are his thoughts?

## 2019-01-23 NOTE — Telephone Encounter (Signed)
Pt's calling about pain medication prescribed for his back is not helping with pain at all. Pt wanting to know what else can be done or if he needs a higher dose.  Please advise.  Thanks, [employer]

## 2019-01-24 MED ORDER — METHYLPREDNISOLONE 4 MG PO TBPK: ORAL_TABLET | ORAL | 0 refills | Status: DC

## 2019-01-24 NOTE — Telephone Encounter (Signed)
Patient was advised.  

## 2019-01-24 NOTE — Telephone Encounter (Signed)
Steroid taper sent in for [facility]

## 2019-01-24 NOTE — Telephone Encounter (Signed)
LMOVM for pt to return call 

## 2019-01-24 NOTE — Telephone Encounter (Signed)
Patient states that he would like to take the steroids. He was advised on increasing his [place] when he starts the steroids.

## 2019-01-30 ENCOUNTER — Telehealth: Payer: Self-pay | Admitting: Family Medicine

## 2019-01-30 DIAGNOSIS — M546 Pain in thoracic spine: Secondary | ICD-10-CM

## 2019-01-30 MED ORDER — TRAMADOL HCL 50 MG PO TABS: 50.0000 mg | ORAL_TABLET | Freq: Three times a day (TID) | ORAL | 0 refills | Status: DC | PRN

## 2019-01-30 NOTE — Telephone Encounter (Signed)
Patient was advised.  

## 2019-01-30 NOTE — Telephone Encounter (Signed)
Tramadol sent in and ordered xrays to make sure nothing going on in spine.

## 2019-01-30 NOTE — Telephone Encounter (Signed)
Pt wanting to let Tawanna Sat know he is not better from the back pain. Night time worse for pulsating pain and waking him up. He is wanting to know what is next to help him get rid of the back pain?  Please advise.  Thanks, [employer]

## 2019-01-30 NOTE — Telephone Encounter (Signed)
Please advise 

## 2019-01-31 ENCOUNTER — Ambulatory Visit
Admission: RE | Admit: 2019-01-31 | Discharge: 2019-01-31 | Disposition: A | Discharge status: Home / Self Care | Payer: Managed Care, Other (non HMO) | Source: Ambulatory Visit | Attending: Physician Assistant | Admitting: Physician Assistant

## 2019-01-31 ENCOUNTER — Other Ambulatory Visit: Payer: Self-pay

## 2019-01-31 DIAGNOSIS — M546 Pain in thoracic spine: Secondary | ICD-10-CM

## 2019-02-03 ENCOUNTER — Telehealth: Payer: Self-pay

## 2019-02-03 DIAGNOSIS — S29012D Strain of muscle and tendon of back wall of thorax, subsequent encounter: Secondary | ICD-10-CM

## 2019-02-03 DIAGNOSIS — B028 Zoster with other complications: Secondary | ICD-10-CM
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
## 2019-02-03 NOTE — Telephone Encounter (Signed)
Pt  Advised.  He states the pain is getting worse especially at night.  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   Thanks,   -Mickel Baas

## 2019-02-03 NOTE — Telephone Encounter (Signed)
Pt calling back regarding his X rays.  Pt has more questions.  Please call pt back,  Thanks, [place]

## 2019-02-05 NOTE — Telephone Encounter (Signed)
Called patient and have left VM

## 2019-02-06 MED ORDER — VALACYCLOVIR HCL 1 G PO TABS: 1000.0000 mg | ORAL_TABLET | Freq: Three times a day (TID) | ORAL | 0 refills | Status: AC

## 2019-02-06 MED ORDER — LIDOCAINE 5 % EX PTCH: 1.0000 | MEDICATED_PATCH | CUTANEOUS | 0 refills | Status: DC

## 2019-02-06 NOTE — Telephone Encounter (Signed)
Spoke with patient. Will try lidocaine patches for pain and will send in valtrex in case this is a prodrome of shingles without a rash.

## 2019-02-06 NOTE — Telephone Encounter (Signed)
Pt returned missed call.  Please call pt back. ° °Thanks, °TGH °

## 2019-02-06 NOTE — Telephone Encounter (Signed)
Please advise refill? 

## 2019-02-08 ENCOUNTER — Other Ambulatory Visit: Payer: Self-pay | Admitting: Physician Assistant

## 2019-02-08 DIAGNOSIS — S29012A Strain of muscle and tendon of back wall of thorax, initial encounter: Secondary | ICD-10-CM

## 2019-02-10 ENCOUNTER — Telehealth: Payer: Self-pay | Admitting: Family Medicine

## 2019-02-10 DIAGNOSIS — S29012D Strain of muscle and tendon of back wall of thorax, subsequent encounter: Secondary | ICD-10-CM

## 2019-02-10 NOTE — Telephone Encounter (Signed)
Should be improving by now, needs referral to physical therapy since it is not.

## 2019-02-10 NOTE — Telephone Encounter (Signed)
Please advise. Patient was seen by Tawanna Sat on 01/16/2019 for back pain and and muscle strain. X Rays were ordered and were unremarkable. Since that visit, patient has been treated with Prednisone, Meloxicam, Tramadol, Baclofen and Lidocaine patches. Patient calling back stating the Tramadol is not helping, and he request something stronger.

## 2019-02-10 NOTE — Telephone Encounter (Signed)
Pt says he is having back pain and the tramadol is not helping much.  He wants to know if there is something stronger  He uses [facility]  CB#  [phone]  [place]

## 2019-02-10 NOTE — Telephone Encounter (Signed)
Patient has been advised. KW 

## 2019-02-11 ENCOUNTER — Telehealth: Payer: Self-pay | Admitting: *Deleted

## 2019-02-11 NOTE — Telephone Encounter (Signed)
Patient called back concerning back pain. Patient wanted to know if here is another medication he can try for back pain. Please advise?

## 2019-02-12 NOTE — Telephone Encounter (Signed)
After agreeing to PT referral, patient called back yesterday evening wanting to know if he could take something else for his back pain. Please advise.

## 2019-02-13 MED ORDER — CYCLOBENZAPRINE HCL 5 MG PO TABS: 5.0000 mg | ORAL_TABLET | Freq: Three times a day (TID) | ORAL | 1 refills | Status: DC

## 2019-02-13 NOTE — Telephone Encounter (Signed)
Only thing that will help in the long term is physical therapy. He can take OTC ibuprofen prn and have sent prescription for muscle relaxer to take prn. Call back for physical therapy when he's ready

## 2019-02-14 NOTE — Telephone Encounter (Signed)
Patient advised. Patient agrees to try this regiment. He states PT has reached out to him and he has an appointment with them next week.

## 2019-02-18 ENCOUNTER — Other Ambulatory Visit: Payer: Self-pay

## 2019-02-18 ENCOUNTER — Encounter: Payer: Self-pay | Admitting: Physical Therapy

## 2019-02-18 ENCOUNTER — Ambulatory Visit: Payer: Managed Care, Other (non HMO) | Attending: Family Medicine | Admitting: Physical Therapy

## 2019-02-18 ENCOUNTER — Other Ambulatory Visit: Payer: Self-pay | Admitting: Family Medicine

## 2019-02-18 DIAGNOSIS — M545 Low back pain, unspecified: Secondary | ICD-10-CM

## 2019-02-18 DIAGNOSIS — R262 Difficulty in walking, not elsewhere classified: Secondary | ICD-10-CM | POA: Diagnosis present

## 2019-02-18 DIAGNOSIS — M62830 Muscle spasm of back: Secondary | ICD-10-CM

## 2019-02-18 NOTE — Telephone Encounter (Signed)
[facility] faxed refill request for the following medications:  losartan (COZAAR) 50 MG tablet    Please advise.

## 2019-02-18 NOTE — Telephone Encounter (Signed)
Prescription was sent in with a year worth of refills on 09/29/2018. I called and confirmed with pharmacy that patient still had refills remaining.
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
## 2019-02-19 ENCOUNTER — Other Ambulatory Visit: Payer: Self-pay | Admitting: Family Medicine

## 2019-02-19 NOTE — Telephone Encounter (Signed)
[facility] faxed refill request for the following medications:  losartan (COZAAR) 50 MG tablet    Please advise.

## 2019-02-20 ENCOUNTER — Ambulatory Visit: Payer: Managed Care, Other (non HMO) | Admitting: Physical Therapy

## 2019-02-20 MED ORDER — LOSARTAN POTASSIUM 50 MG PO TABS: 50.0000 mg | ORAL_TABLET | Freq: Every day | ORAL | 3 refills | Status: DC

## 2019-02-24 ENCOUNTER — Ambulatory Visit: Payer: Managed Care, Other (non HMO) | Admitting: Physical Therapy

## 2019-02-24 ENCOUNTER — Telehealth: Payer: Self-pay | Admitting: Physical Therapy

## 2019-02-24 NOTE — Telephone Encounter (Signed)
Called patient after he did not show up for his appointment today at 6:30. Patient answered and said he had tried to call maybe last Friday to say he could not come to the time on his sheet. Said his back has been continuing to feel better but was unsure if he wanted to discontinue physical therapy. After offering several options, patient decided he wants to come to his next scheduled appt Wed 02/26/2019 at 6:30 before making a decision on whether to continue or not. Confirmed the appointment time with him and he said he was able and planning to attend.   Everlean Alstrom. Graylon Good, PT, DPT 02/24/19, 7:14 PM

## 2019-02-26 ENCOUNTER — Encounter: Payer: Managed Care, Other (non HMO) | Admitting: Physical Therapy

## 2019-02-26 ENCOUNTER — Ambulatory Visit: Payer: Managed Care, Other (non HMO) | Admitting: Physical Therapy

## 2019-03-04 ENCOUNTER — Encounter: Payer: Managed Care, Other (non HMO) | Admitting: Physical Therapy

## 2019-03-06 ENCOUNTER — Encounter: Payer: Managed Care, Other (non HMO) | Admitting: Physical Therapy

## 2019-03-10 ENCOUNTER — Encounter: Payer: Managed Care, Other (non HMO) | Admitting: Physical Therapy

## 2019-03-10 ENCOUNTER — Ambulatory Visit: Payer: Self-pay | Admitting: Family Medicine

## 2019-03-12 ENCOUNTER — Encounter: Payer: Managed Care, Other (non HMO) | Admitting: Physical Therapy

## 2019-03-16 ENCOUNTER — Other Ambulatory Visit: Payer: Self-pay | Admitting: Physician Assistant

## 2019-03-16 DIAGNOSIS — S29012D Strain of muscle and tendon of back wall of thorax, subsequent encounter: Secondary | ICD-10-CM

## 2019-03-17 ENCOUNTER — Encounter: Payer: Managed Care, Other (non HMO) | Admitting: Physical Therapy

## 2019-03-17 ENCOUNTER — Ambulatory Visit: Payer: Self-pay | Admitting: Family Medicine

## 2019-03-18 ENCOUNTER — Other Ambulatory Visit: Payer: Self-pay | Admitting: Family Medicine

## 2019-03-18 MED ORDER — CYCLOBENZAPRINE HCL 5 MG PO TABS: 5.0000 mg | ORAL_TABLET | Freq: Three times a day (TID) | ORAL | 5 refills | Status: DC

## 2019-03-18 NOTE — Telephone Encounter (Signed)
Last OV 01/16/2019 and last refill 02/13/2019 #30 with 1 refill. Sig: TID

## 2019-03-18 NOTE — Telephone Encounter (Signed)
Pt needing refills on: cyclobenzaprine (FLEXERIL) 5 MG tablet  [facility]: [id number], [place] (Phone) [phone] (Fax)   Thanks, [employer]

## 2019-03-19 ENCOUNTER — Encounter: Payer: Managed Care, Other (non HMO) | Admitting: Physical Therapy

## 2019-03-24 ENCOUNTER — Encounter: Payer: Managed Care, Other (non HMO) | Admitting: Physical Therapy

## 2019-03-26 ENCOUNTER — Encounter: Payer: Managed Care, Other (non HMO) | Admitting: Physical Therapy

## 2019-03-31 ENCOUNTER — Encounter: Payer: Managed Care, Other (non HMO) | Admitting: Physical Therapy

## 2019-04-02 ENCOUNTER — Encounter: Payer: Managed Care, Other (non HMO) | Admitting: Physical Therapy

## 2019-04-07 ENCOUNTER — Telehealth: Payer: Self-pay | Admitting: Family Medicine

## 2019-04-07 ENCOUNTER — Encounter: Payer: Managed Care, Other (non HMO) | Admitting: Physical Therapy

## 2019-04-07 MED ORDER — IBUPROFEN 800 MG PO TABS: ORAL_TABLET | ORAL | 3 refills | Status: DC

## 2019-04-07 NOTE — Telephone Encounter (Signed)
Refill sent.

## 2019-04-07 NOTE — Telephone Encounter (Signed)
[facility] faxed refill request for the following medications:  ibuprofen (ADVIL,MOTRIN) 800 MG tablet   90 day supply Last Rx: 04/29/2018 with 3 refills LOV: 02/12/2019 with Tawanna Sat; 11/04/2018 with Dr. Caryn Section Can another provider review refill request since Dr. Caryn Section is out of the office this week? Please advise. Thanks TNP

## 2019-04-09 ENCOUNTER — Other Ambulatory Visit: Payer: Self-pay | Admitting: Family Medicine

## 2019-04-09 ENCOUNTER — Encounter: Payer: Managed Care, Other (non HMO) | Admitting: Physical Therapy

## 2019-04-09 DIAGNOSIS — E7800 Pure hypercholesterolemia, unspecified: Secondary | ICD-10-CM

## 2019-06-04 ENCOUNTER — Encounter: Payer: Self-pay | Admitting: Physical Therapy

## 2019-06-04 DIAGNOSIS — M545 Low back pain, unspecified: Secondary | ICD-10-CM

## 2019-06-04 DIAGNOSIS — M62830 Muscle spasm of back: Secondary | ICD-10-CM

## 2019-06-04 DIAGNOSIS — R262 Difficulty in walking, not elsewhere classified: Secondary | ICD-10-CM
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
## 2019-06-23 ENCOUNTER — Telehealth: Payer: Self-pay | Admitting: Family Medicine

## 2019-06-23 NOTE — Telephone Encounter (Signed)
Copied from [place] [id number]. Topic: General - Other >> Jun 23, 2019  2:49 PM Keene Breath wrote: Reason for CRM: Patient called to ask the nurse or doctor to call him in something to make him sleep.  He did not want an appt. And would like for the nurse to call him to discuss at [phone]

## 2019-06-24 NOTE — Telephone Encounter (Signed)
Appointment scheduled 06/25/2019. Patient is also due to have A1C re-checked.

## 2019-06-25 ENCOUNTER — Ambulatory Visit: Payer: Managed Care, Other (non HMO) | Admitting: Family Medicine

## 2019-06-25 ENCOUNTER — Encounter: Payer: Self-pay | Admitting: Family Medicine

## 2019-06-25 ENCOUNTER — Other Ambulatory Visit: Payer: Self-pay

## 2019-06-25 VITALS — BP 147/81 | HR 86 | Temp 96.8°F | Wt 186.6 lb

## 2019-06-25 DIAGNOSIS — F5101 Primary insomnia: Secondary | ICD-10-CM

## 2019-06-25 DIAGNOSIS — Z794 Long term (current) use of insulin: Secondary | ICD-10-CM | POA: Diagnosis not present

## 2019-06-25 DIAGNOSIS — E11319 Type 2 diabetes mellitus with unspecified diabetic retinopathy without macular edema: Secondary | ICD-10-CM | POA: Diagnosis not present

## 2019-06-25 DIAGNOSIS — I10 Essential (primary) hypertension: Secondary | ICD-10-CM

## 2019-06-25 LAB — POCT GLYCOSYLATED HEMOGLOBIN (HGB A1C)
Est. average glucose Bld gHb Est-mCnc: 169
Hemoglobin A1C: 7.5 % — AB (ref 4.0–5.6)

## 2019-06-25 MED ORDER — HYDROXYZINE HCL 25 MG PO TABS: 25.0000 mg | ORAL_TABLET | Freq: Every evening | ORAL | 2 refills | Status: DC | PRN

## 2019-06-25 NOTE — Progress Notes (Signed)
Patient: Robert [name] Male    DOB: 08-Apr-1961   59 y.o.   MRN: [id number] Visit Date: 06/25/2019  Today's Provider: Lelon Huh, MD   Chief Complaint  Patient presents with  . Diabetes  . Hypertension  . Hyperlipidemia  . Insomnia   Subjective:     HPI  Diabetes Mellitus Type II, Follow-up:   Recent Labs       Lab Results  Component Value Date   HGBA1C 7.5 (A) 04/29/2018   HGBA1C 8.0 (A) 11/13/2017   HGBA1C 11.1 08/17/2017     Last seen for diabetes 8 months ago.  Management since then includes increased Soliqua to 60 IU daily He reports good compliance with treatment. He is not having side effects.  Current symptoms include none  Home blood sugar records: blood sugars are not checked at home  Episodes of hypoglycemia? no              Current Insulin Regimen: none Most Recent Eye Exam: 01/22/2019 Weight trend: fluctuating a bit Prior visit with dietician: no Current diet: unhealthy Current exercise: none  ------------------------------------------------------------------------   Hypertension, follow-up:     BP Readings from Last 3 Encounters:  11/04/18 130/60  06/12/18 (!) 145/80  04/29/18 136/80    He was last seen for hypertension 8 months ago.  BP at that visit was 130/60 Management since that visit includes no changes. He reports good compliance with treatment. He is not having side effects.  He is not exercising. He is adherent to low salt diet.   Outside blood pressures are not checked. He is experiencing none.  Patient denies chest pain, chest pressure/discomfort, claudication, dyspnea, exertional chest pressure/discomfort, fatigue, irregular heart beat, lower extremity edema, near-syncope, orthopnea, palpitations, paroxysmal nocturnal dyspnea, syncope and tachypnea.   Cardiovascular risk factors include advanced age (older than 63 for men, 50 for women), diabetes mellitus, dyslipidemia, hypertension and male gender.  Use  of agents associated with hypertension: NSAIDS.   ------------------------------------------------------------------------    Lipid/Cholesterol, Follow-up:   Last seen for this 8 months ago.  Management since that visit includes no changes.  Last Lipid Panel: Lab Results  Component Value Date   CHOL 145 11/14/2017   HDL 54 11/14/2017   LDLCALC 75 11/14/2017   TRIG 78 11/14/2017   CHOLHDL 2.7 11/14/2017    He reports good compliance with treatment. He is not having side effects.      Wt Readings from Last 3 Encounters:  11/04/18 182 lb (82.6 kg)  06/12/18 184 lb (83.5 kg)  04/29/18 185 lb (83.9 kg)    Insomnia  He presents today for evaluation of insomnia. Onset was 1 months ago. Insomnia is getting unchanged. He has trouble sleeping once a day.   He has difficulty FALLING asleep. He has difficulty STAYING asleep. He does wake frequently to urinate. He does not have urge to move legs when resting. He is having pain when trying to sleep  He is not having anxiety. He is not having a lot of stress in his life. Marland Kitchen He is not having depression.  He is not taking stimulant medications.  He is not taking new medications:  He is not taking OTC sleeping aid. Marland Kitchen He is not taking medications to help sleep. He is not drinking alcohol to help sleep. He is not using illicit drugs.  He did try a low of melatonin.  States he usually falls asleep then wakes every 2-3 hours.  --------------------------------------------------------------------  No Known Allergies   Current Outpatient Medications:  .  aspirin EC 81 MG tablet, Take 81 mg by mouth daily., Disp: , Rfl:  .  B-D UF III MINI PEN NEEDLES 31G X 5 MM MISC, USE 4 TIMES DAILY, Disp: 360 each, Rfl: 4 .  baclofen (LIORESAL) 10 MG tablet, Take 1 tablet (10 mg total) by mouth 3 (three) times daily., Disp: 30 each, Rfl: 0 .  Blood Glucose Monitoring Suppl (ACCU-CHEK AVIVA PLUS) w/Device KIT, Use to check blood  sugar three times daily for insulin dependent diabetes (E11.9), Disp: 1 kit, Rfl: 0 .  cyclobenzaprine (FLEXERIL) 5 MG tablet, Take 1 tablet (5 mg total) by mouth 3 (three) times daily. Reported on 07/23/2015, Disp: 30 tablet, Rfl: 5 .  diltiazem (TIAZAC) 180 MG 24 hr capsule, TAKE 1 CAPSULE BY MOUTH  DAILY, Disp: 90 capsule, Rfl: 4 .  glucose blood test strip, Use as instructed to check sugar three times daily for insulin dependent diabets., Disp: 100 each, Rfl: 12 .  ibuprofen (ADVIL) 800 MG tablet, Take 1 tablet by mouth twice a day as needed., Disp: 180 tablet, Rfl: 3 .  Insulin Glargine-Lixisenatide (SOLIQUA) 100-33 UNT-MCG/ML SOPN, Inject 60 Units as directed daily., Disp: 45 mL, Rfl: 4 .  JARDIANCE 10 MG TABS tablet, TAKE 1 TABLET BY MOUTH  DAILY, Disp: 90 tablet, Rfl: 4 .  Lancets (ONETOUCH ULTRASOFT) lancets, Use as instructed, Disp: 100 each, Rfl: 12 .  losartan (COZAAR) 50 MG tablet, Take 1 tablet (50 mg total) by mouth daily., Disp: 90 tablet, Rfl: 3 .  meloxicam (MOBIC) 15 MG tablet, TAKE 1 TABLET BY MOUTH EVERY DAY, Disp: 30 tablet, Rfl: 0 .  metFORMIN (GLUCOPHAGE) 1000 MG tablet, TAKE 1 TABLET BY MOUTH  DAILY, Disp: 90 tablet, Rfl: 3 .  pravastatin (PRAVACHOL) 40 MG tablet, TAKE 1 TABLET BY MOUTH  DAILY, Disp: 90 tablet, Rfl: 3 .  sildenafil (VIAGRA) 100 MG tablet, Take 0.5-1 tablets (50-100 mg total) by mouth daily as needed for erectile dysfunction., Disp: 10 tablet, Rfl: 5 .  lidocaine (LIDODERM) 5 %, PLACE 1 PATCH ONTO THE SKIN DAILY. REMOVE & DISCARD PATCH WITHIN 12 HOURS OR AS DIRECTED BY MD (Patient not taking: Reported on 06/25/2019), Disp: 30 patch, Rfl: 0 .  traMADol (ULTRAM) 50 MG tablet, Take 1 tablet (50 mg total) by mouth every 8 (eight) hours as needed. (Patient not taking: Reported on 06/25/2019), Disp: 30 tablet, Rfl: 0 .  valACYclovir (VALTREX) 1000 MG tablet, Take 1 tablet (1,000 mg total) by mouth 3 (three) times daily. (Patient not taking: Reported on 06/25/2019), Disp:  21 tablet, Rfl: 0  Review of Systems  Constitutional: Negative.   Respiratory: Negative.   Cardiovascular: Negative.   Musculoskeletal: Positive for back pain.  Psychiatric/Behavioral: Positive for sleep disturbance.    Social History   Tobacco Use  . Smoking status: Never Smoker  . Smokeless tobacco: Never Used  Substance Use Topics  . Alcohol use: No    Alcohol/week: 0.0 standard drinks      Objective:   BP (!) 147/81 (BP Location: Right Arm, Patient Position: Sitting, Cuff Size: Normal)   Pulse 86   Temp (!) 96.8 F (36 C) (Temporal)   Wt 186 lb 9.6 oz (84.6 kg)   BMI 31.05 kg/m  Vitals:   06/25/19 1038  BP: (!) 147/81  Pulse: 86  Temp: (!) 96.8 F (36 C)  TempSrc: Temporal  Weight: 186 lb 9.6 oz (84.6 kg)  Body mass index is  31.05 kg/m.   Physical Exam   General: Appearance:    Overweight male in no acute distress  Eyes:    PERRL, conjunctiva/corneas clear, EOM's intact       Lungs:     Clear to auscultation bilaterally, respirations unlabored  Heart:    Normal heart rate. Normal rhythm. No murmurs, rubs, or gallops.   MS:   Above knee amputation of right lower extremity is noted.   Neurologic:   Awake, alert, oriented x 3. No apparent focal neurological           defect.        Results for orders placed or performed in visit on 06/25/19  POCT HgB A1C  Result Value Ref Range   Hemoglobin A1C 7.5 (A) 4.0 - 5.6 %   Est. average glucose Bld gHb Est-mCnc 169        Assessment & Plan    1. Primary insomnia Recommend nightly melatonin and prn- hydrOXYzine (ATARAX/VISTARIL) 25 MG tablet; Take 1 tablet (25 mg total) by mouth at bedtime as needed (insomnia).  Dispense: 30 tablet; Refill: 2  2. Type 2 diabetes mellitus with retinopathy, with long-term current use of insulin, macular edema presence unspecified, unspecified laterality, unspecified retinopathy severity ([place]) Fairly well controlled. Continue current medications.  Return for dm follow up in 4  months.   3. Essential hypertension Fairly well controlled. Continue current medications.        Lelon Huh, MD  [facility]

## 2019-06-25 NOTE — Patient Instructions (Signed)
.   Please review the attached list of medications and notify my office if there are any errors.   . Please bring all of your medications to every appointment so we can make sure that our medication list is the same as yours.    You can take 5-12 mg of melatonin every night before going to bed to regulate your sleep cycle

## 2019-07-27 ENCOUNTER — Other Ambulatory Visit: Payer: Self-pay | Admitting: Family Medicine

## 2019-07-27 DIAGNOSIS — E1165 Type 2 diabetes mellitus with hyperglycemia: Secondary | ICD-10-CM

## 2019-07-27 DIAGNOSIS — IMO0002 Reserved for concepts with insufficient information to code with codable children: Secondary | ICD-10-CM

## 2019-07-27 NOTE — Telephone Encounter (Signed)
Requested Prescriptions  Pending Prescriptions Disp Refills  . JARDIANCE 10 MG TABS tablet [Pharmacy Med Name: JARDIANCE  10MG  TAB] 90 tablet 1    Sig: TAKE 1 TABLET BY MOUTH  DAILY     Endocrinology:  Diabetes - SGLT2 Inhibitors Failed - 07/27/2019  4:46 AM      Failed - Cr in normal range and within 360 days    Creatinine, Ser  Date Value Ref Range Status  04/29/2018 1.32 (H) 0.76 - 1.27 mg/dL Final         Failed - LDL in normal range and within 360 days    LDL Calculated  Date Value Ref Range Status  11/14/2017 75 0 - 99 mg/dL Final         Failed - eGFR in normal range and within 360 days    GFR calc Af Amer  Date Value Ref Range Status  04/29/2018 69 >59 mL/min/1.73 Final   GFR calc non Af Amer  Date Value Ref Range Status  04/29/2018 60 >59 mL/min/1.73 Final         Passed - HBA1C is between 0 and 7.9 and within 180 days    Hemoglobin A1C  Date Value Ref Range Status  06/25/2019 7.5 (A) 4.0 - 5.6 % Final   Hgb A1c MFr Bld  Date Value Ref Range Status  09/22/2014 9.2 (A) 4.0 - 6.0 % Final         Passed - Valid encounter within last 6 months    Recent Outpatient Visits          1 month ago Primary insomnia   [facility] Birdie Sons, MD   6 months ago Acute right-sided back pain, unspecified back location   [facility], [place], [place]   8 months ago Uncontrolled type 2 diabetes mellitus with complication, with long-term current use of insulin [facility])   [facility] Birdie Sons, MD   1 year ago Cough   [facility] Birdie Sons, MD   1 year ago Essential hypertension   [facility] Birdie Sons, MD      Future Appointments            In 2 months Fisher, Kirstie Peri, MD [facility], [facility]           Per Dr. Maralyn Sago note during [place] in 06/2019 patient to continue current regimen and f/u in 4 months.

## 2019-08-14 ENCOUNTER — Ambulatory Visit: Payer: Managed Care, Other (non HMO) | Attending: Internal Medicine

## 2019-08-14 DIAGNOSIS — Z23 Encounter for immunization: Secondary | ICD-10-CM

## 2019-08-14 NOTE — Progress Notes (Signed)
   Covid-19 Vaccination Clinic  Name:  Robert [name]    MRN: [id number] DOB: 1960-08-19  08/14/2019  Mr. Robert [name] was observed post Covid-19 immunization for 15 minutes without incident. He was provided with Vaccine Information Sheet and instruction to access the V-Safe system.   Mr. Robert [name] was instructed to call 911 with any severe reactions post vaccine: Marland Kitchen Difficulty breathing  . Swelling of face and throat  . A fast heartbeat  . A bad rash all over body  . Dizziness and weakness   Immunizations Administered    Name Date Dose VIS Date Route   Pfizer COVID-19 Vaccine 08/14/2019 11:29 AM 0.3 mL 04/25/2019 Intramuscular   Manufacturer: [place]   Lot: [id number]   [place]: [id number]

## 2019-08-18 ENCOUNTER — Other Ambulatory Visit: Payer: Self-pay | Admitting: Family Medicine

## 2019-09-10 ENCOUNTER — Ambulatory Visit: Payer: Managed Care, Other (non HMO) | Attending: Internal Medicine

## 2019-09-10 DIAGNOSIS — Z23 Encounter for immunization: Secondary | ICD-10-CM

## 2019-09-10 NOTE — Progress Notes (Signed)
   Covid-19 Vaccination Clinic  Name:  Robert [name]    MRN: [id number] DOB: Aug 21, 1960  09/10/2019  [name] was observed post Covid-19 immunization for 15 minutes without incident. He was provided with Vaccine Information Sheet and instruction to access the V-Safe system.   [name] was instructed to call 911 with any severe reactions post vaccine: Marland Kitchen Difficulty breathing  . Swelling of face and throat  . A fast heartbeat  . A bad rash all over body  . Dizziness and weakness   Immunizations Administered    Name Date Dose VIS Date Route   Pfizer COVID-19 Vaccine 09/10/2019 11:14 AM 0.3 mL 07/09/2018 Intramuscular   Manufacturer: [place]   Lot: [id number]   [place]: [id number]

## 2019-09-23 ENCOUNTER — Other Ambulatory Visit: Payer: Self-pay | Admitting: Family Medicine

## 2019-09-23 DIAGNOSIS — F5101 Primary insomnia: Secondary | ICD-10-CM

## 2019-09-23 NOTE — Telephone Encounter (Signed)
Requested Prescriptions  Pending Prescriptions Disp Refills  . hydrOXYzine (ATARAX/VISTARIL) 25 MG tablet [Pharmacy Med Name: HYDROXYZINE HCL 25 MG TABLET] 30 tablet 2    Sig: TAKE 1 TABLET (25 MG TOTAL) BY MOUTH AT BEDTIME AS NEEDED (INSOMNIA).     Ear, Nose, and Throat:  Antihistamines Passed - 09/23/2019  1:59 AM      Passed - Valid encounter within last 12 months    Recent Outpatient Visits          3 months ago Primary insomnia   [facility] Birdie Sons, MD   8 months ago Acute right-sided back pain, unspecified back location   [facility], [place], [place]   10 months ago Uncontrolled type 2 diabetes mellitus with complication, with long-term current use of insulin [facility])   [facility] Birdie Sons, MD   1 year ago Cough   [facility] Birdie Sons, MD   1 year ago Essential hypertension   [facility] Birdie Sons, MD      Future Appointments            In 4 weeks Fisher, Kirstie Peri, MD [facility], [place]

## 2019-10-09 IMAGING — CR DG THORACIC SPINE 3V
1 series · 3 of 3 positions shown · non-contrast
Comparison: None.

CLINICAL DATA: Mid back pain radiating to right posterior shoulder

EXAM:
THORACIC SPINE - 3 VIEWS

[Series 1: dg thoracic spine w/swimmers · 0.14mm/px · 3 of 3 slices shown]
[im 1/3]
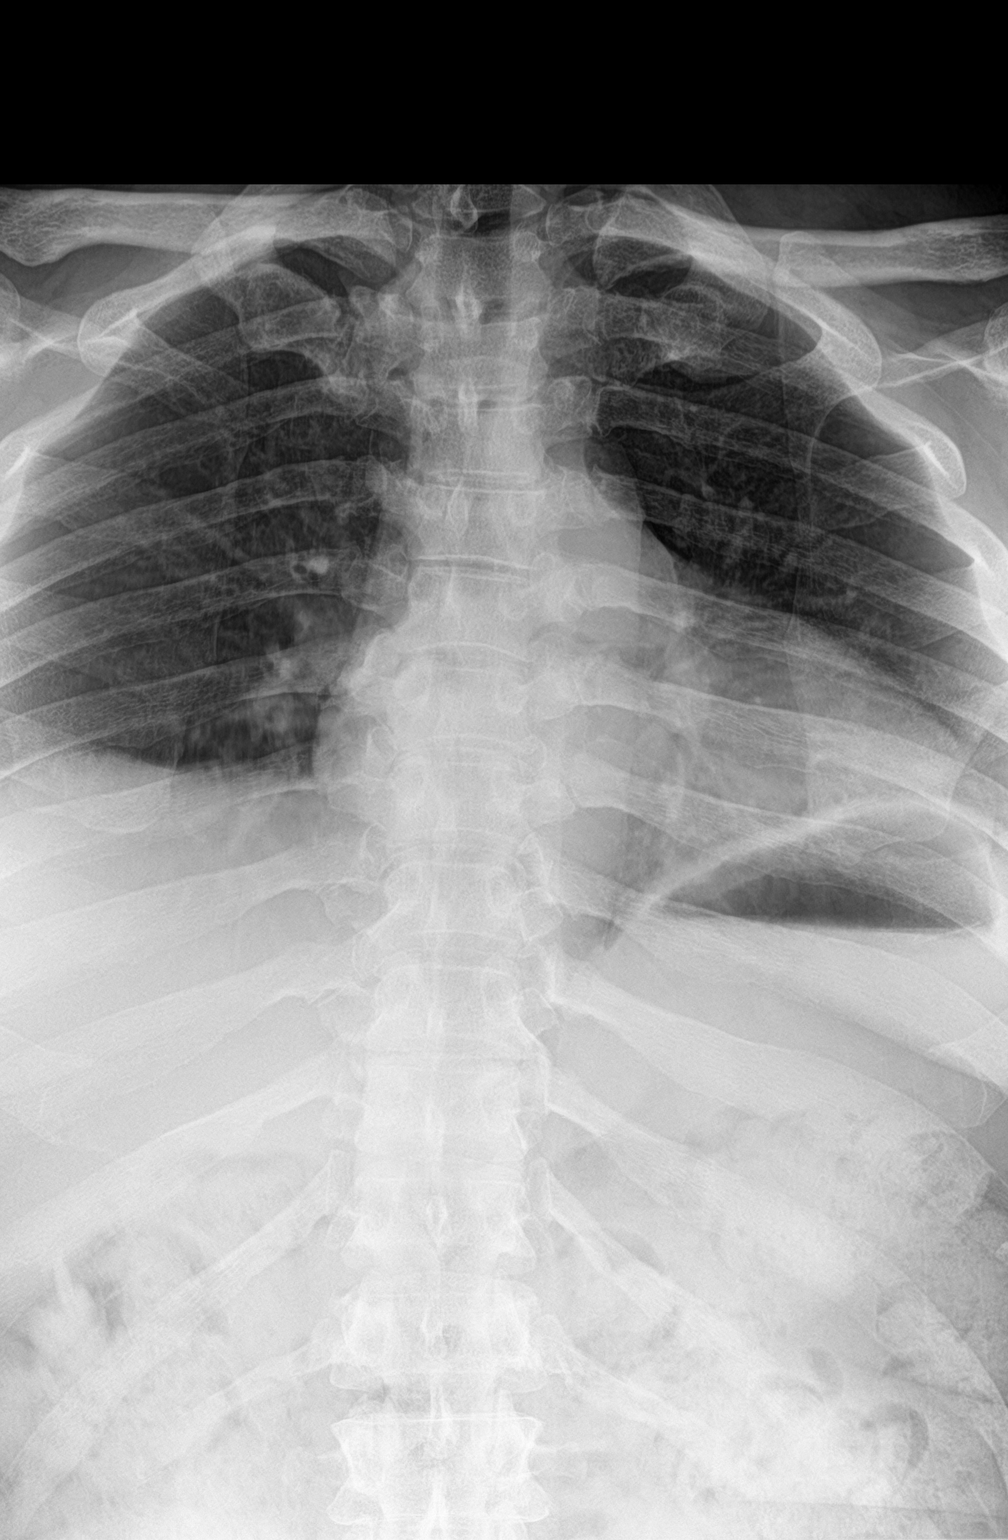
[im 2/3]
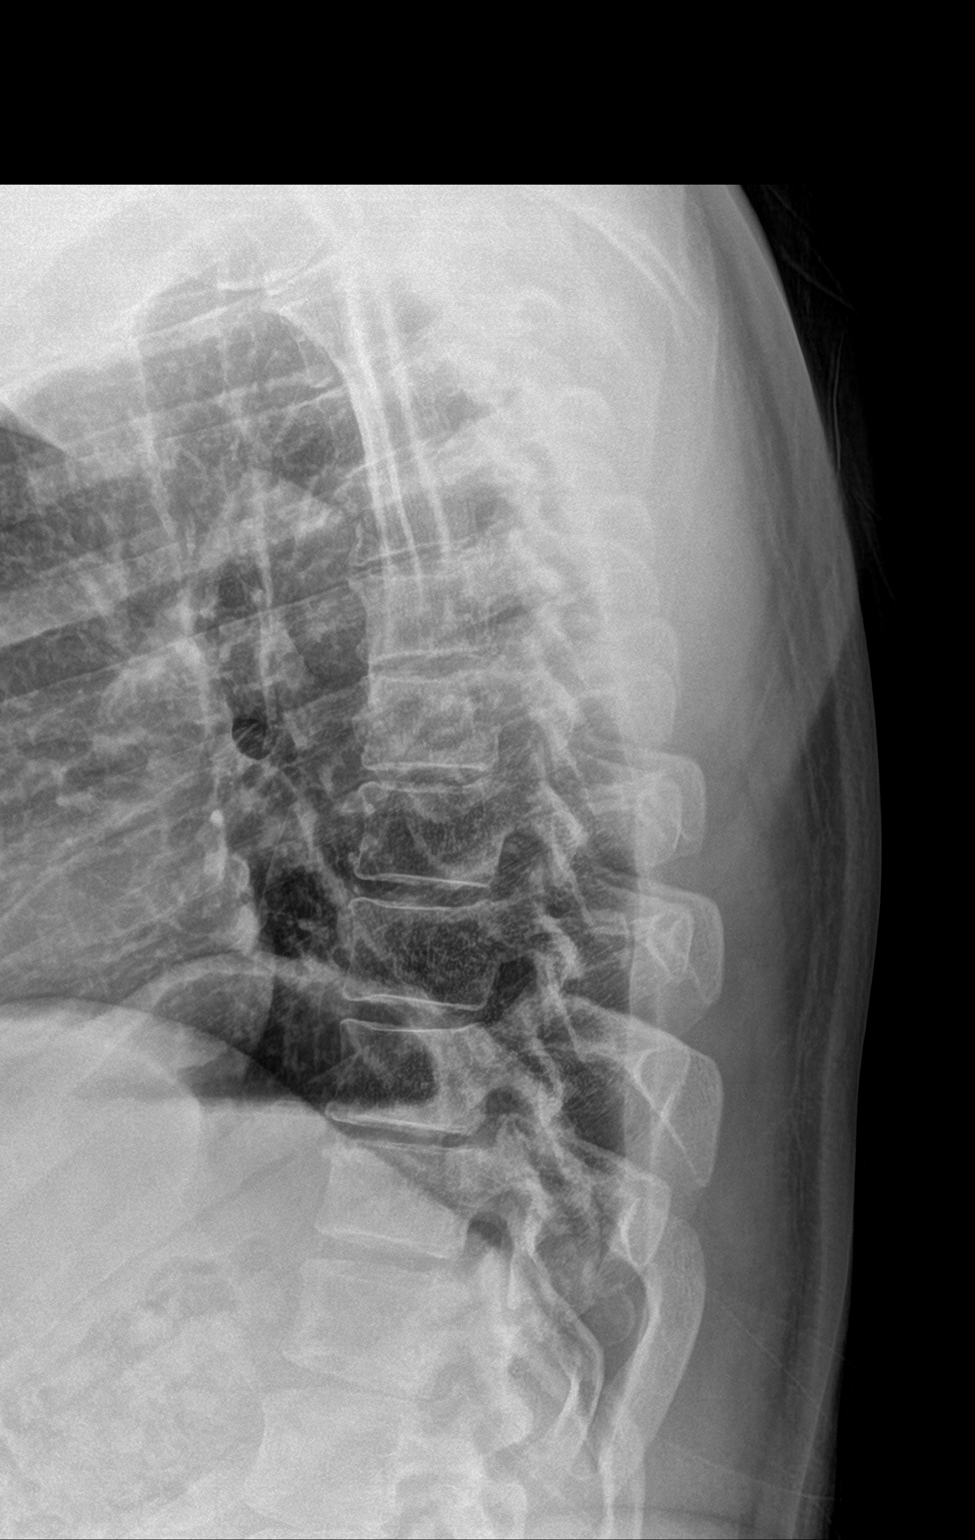
[im 3/3]
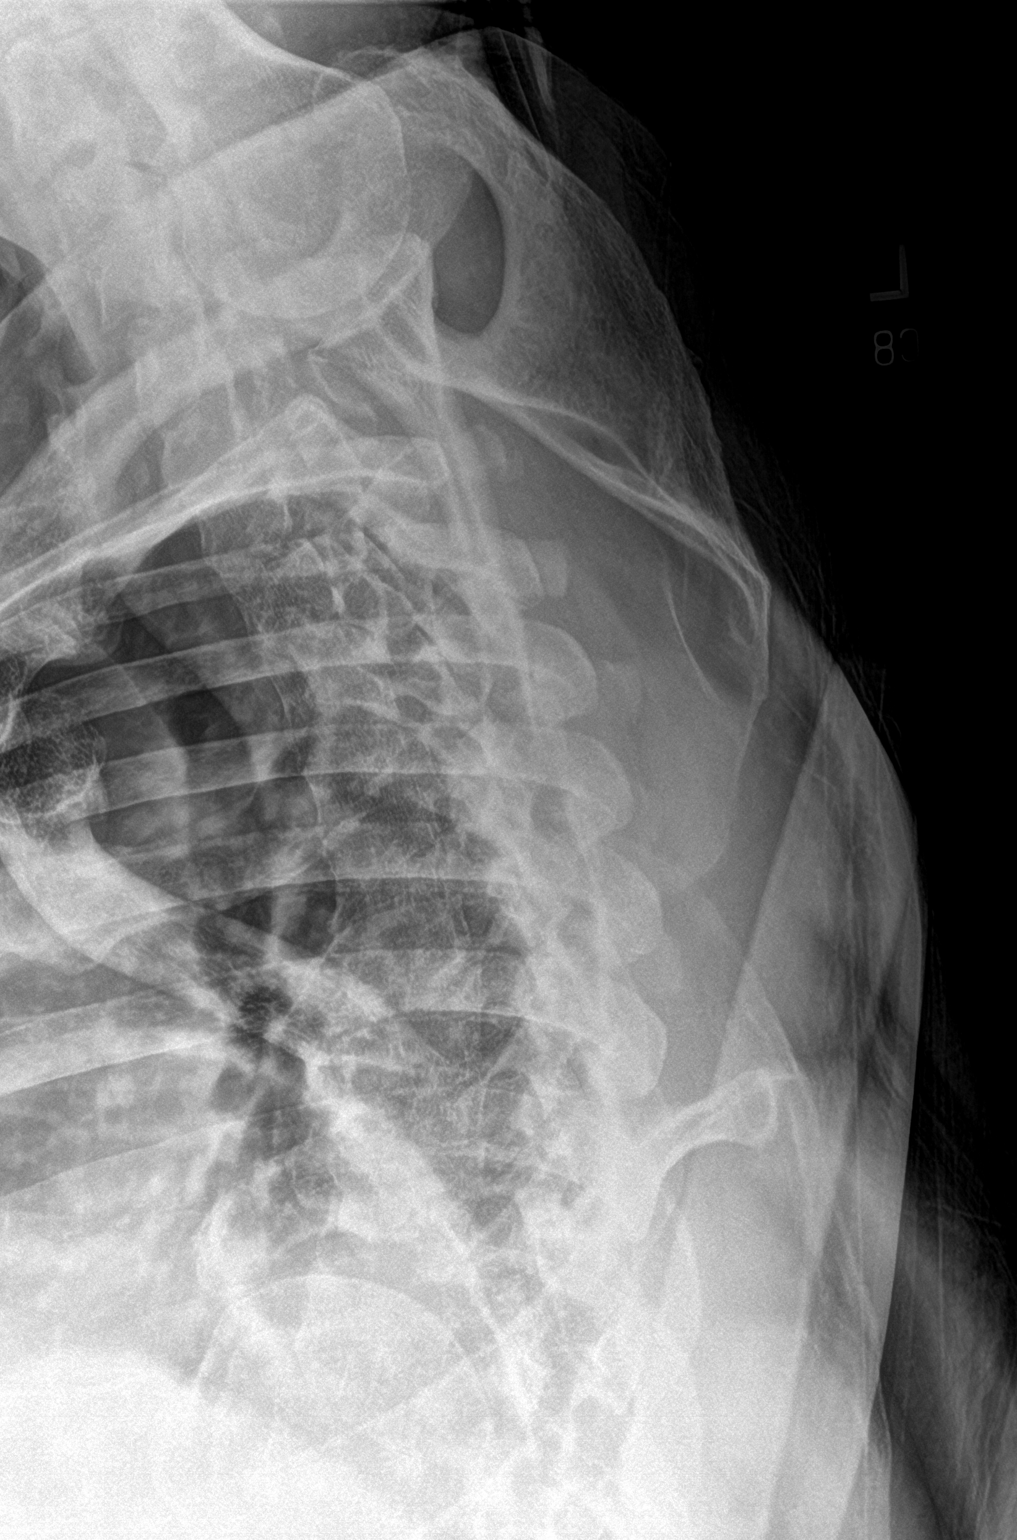

[3 of 3 positions shown; findings below may reference images not displayed]

FINDINGS: Normal thoracic kyphosis.

No evidence of fracture or dislocation. Vertebral body heights and
intervertebral disc spaces are maintained.

Visualized lungs are clear.
IMPRESSION: Negative.

## 2019-10-09 IMAGING — CR DG LUMBAR SPINE COMPLETE 4+V
1 series · 5 of 5 positions shown · non-contrast
Comparison: None.

CLINICAL DATA: Mid to low back pain

EXAM:
LUMBAR SPINE - COMPLETE 4+ VIEW

[Series 1: dg lumbar spine complete 4 +v · 0.14mm/px · 5 of 5 slices shown]
[im 1/5]
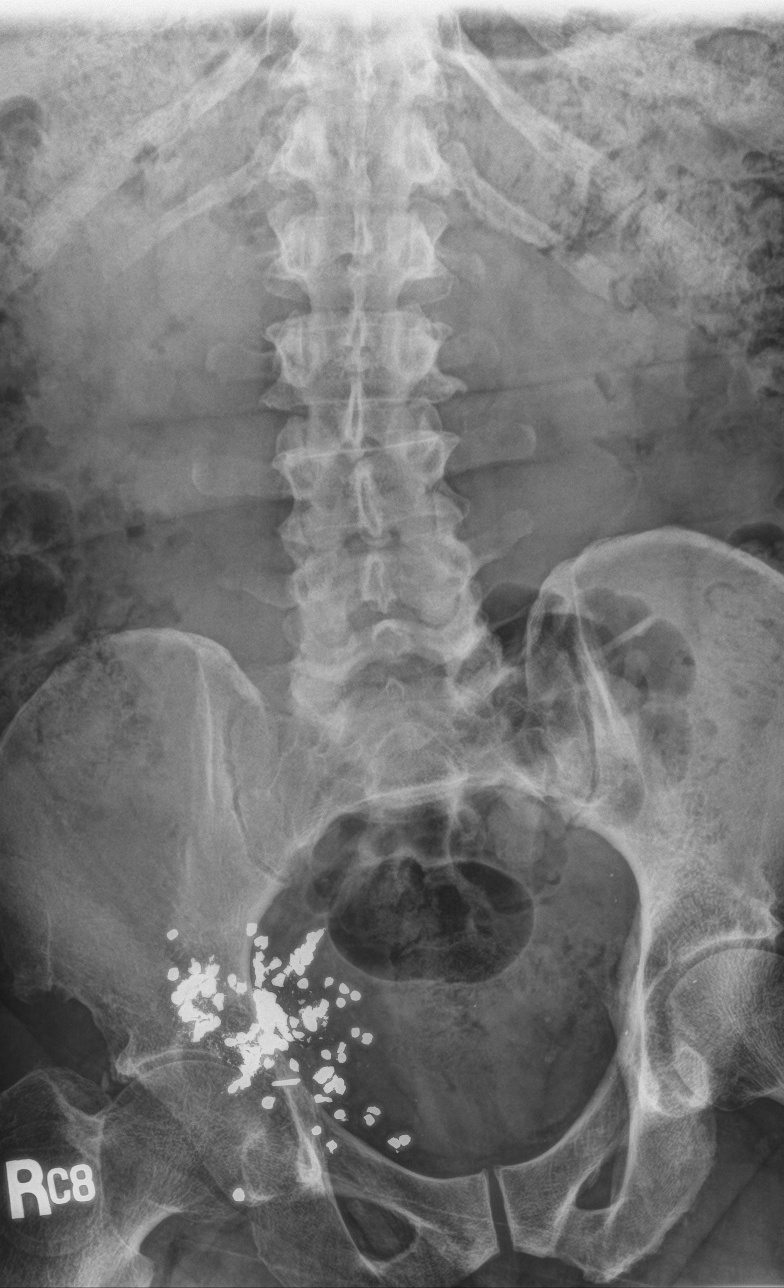
[im 2/5]
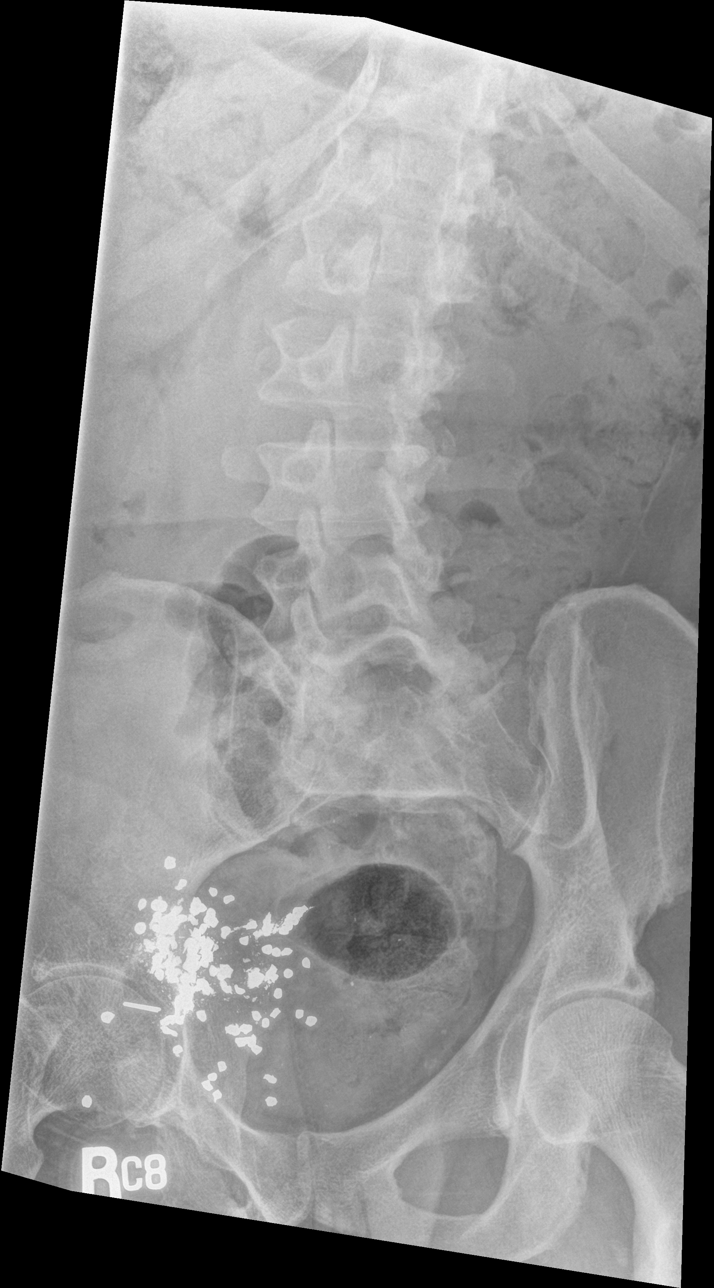
[im 3/5]
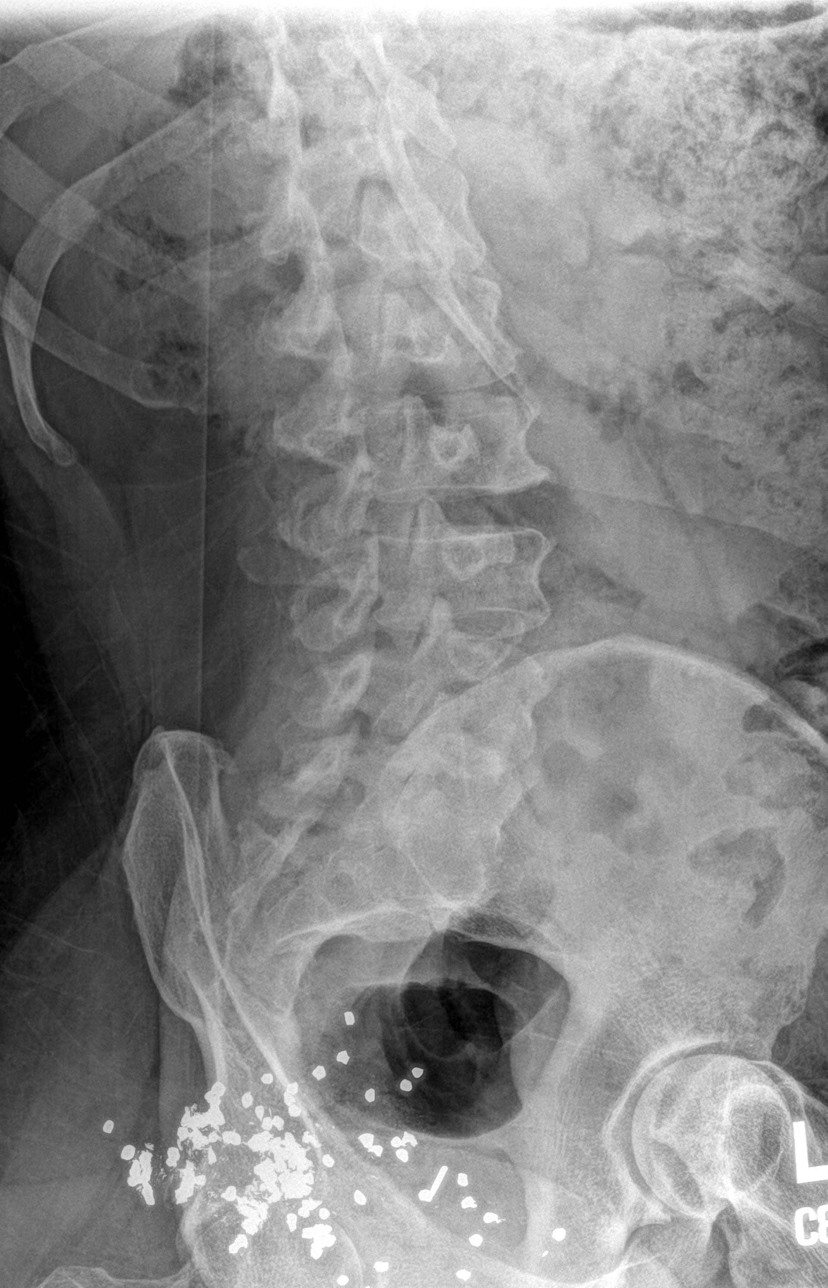
[im 4/5]
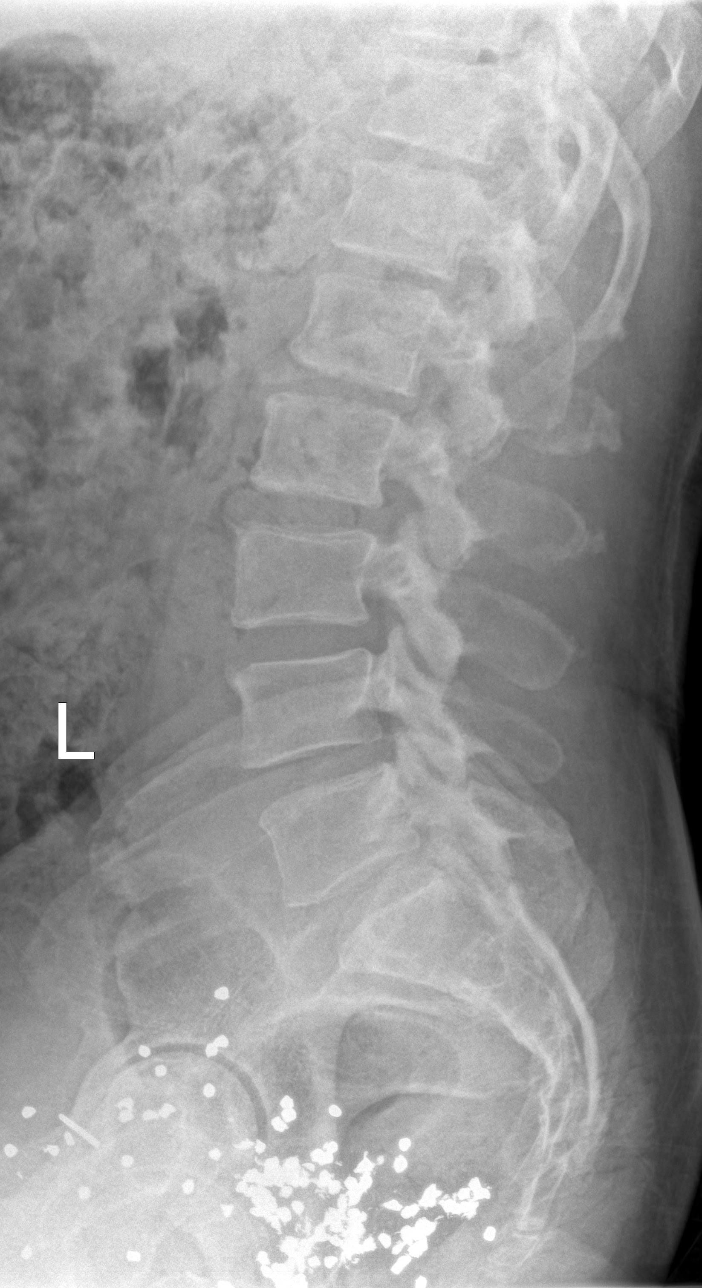
[im 5/5]
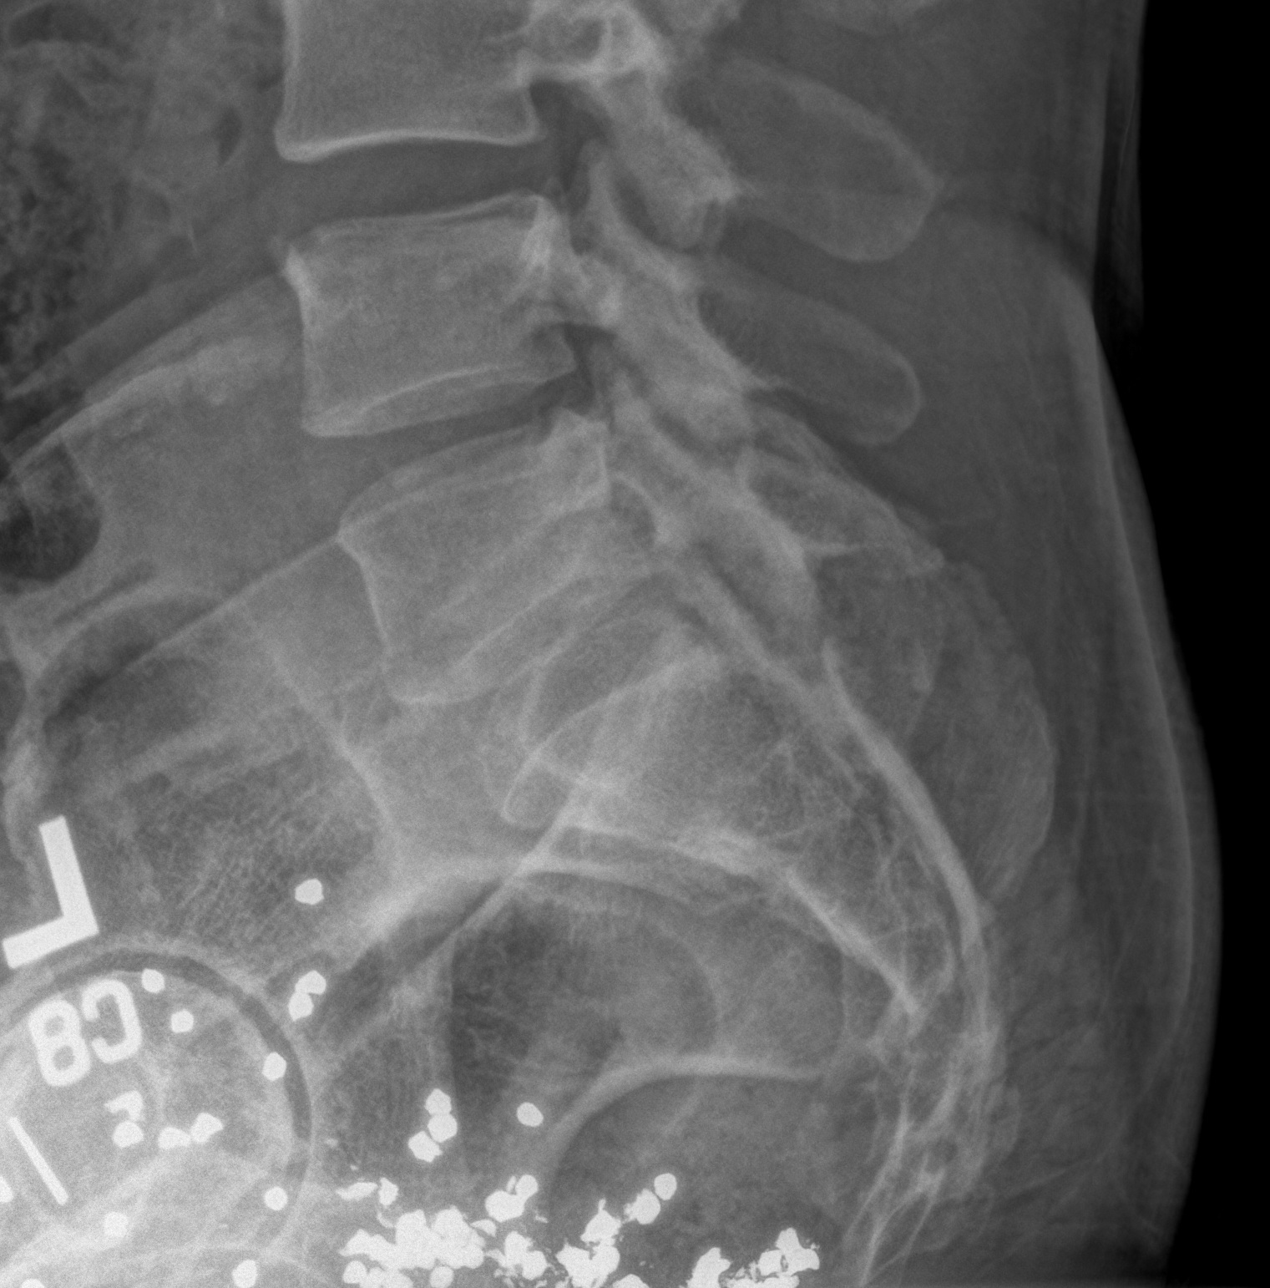

[5 of 5 positions shown; findings below may reference images not displayed]

FINDINGS: Normal thoracic kyphosis.

No evidence of fracture or dislocation. Vertebral body heights are
maintained.

Visualized bony pelvis appears intact.

Shrapnel overlying the right lower pelvis.
IMPRESSION: Negative.

## 2019-10-19 ENCOUNTER — Other Ambulatory Visit: Payer: Self-pay | Admitting: Family Medicine

## 2019-10-19 NOTE — Telephone Encounter (Signed)
Requested Prescriptions  Pending Prescriptions Disp Refills  . diltiazem (TIAZAC) 180 MG 24 hr capsule [Pharmacy Med Name: DILTIAZEM  180MG   CAP  24 HR TR] 90 capsule 0    Sig: TAKE 1 CAPSULE BY MOUTH  DAILY     Cardiovascular:  Calcium Channel Blockers Failed - 10/19/2019  4:43 AM      Failed - Last BP in normal range    BP Readings from Last 1 Encounters:  06/25/19 (!) 147/81         Passed - Valid encounter within last 6 months    Recent Outpatient Visits          3 months ago Primary insomnia   [place], [place]   9 months ago Acute right-sided back pain, unspecified back location   [facility], [place], [place]   11 months ago Uncontrolled type 2 diabetes mellitus with complication, with long-term current use of insulin [facility])   [facility] Birdie Sons, MD   1 year ago Cough   [facility] Birdie Sons, MD   1 year ago Essential hypertension   [facility] Birdie Sons, MD      Future Appointments            In 3 days Fisher, Kirstie Peri, MD [facility], [place]

## 2019-10-22 ENCOUNTER — Ambulatory Visit: Payer: Managed Care, Other (non HMO) | Admitting: Family Medicine

## 2019-10-22 ENCOUNTER — Encounter: Payer: Self-pay | Admitting: Family Medicine

## 2019-10-22 ENCOUNTER — Other Ambulatory Visit: Payer: Self-pay

## 2019-10-22 VITALS — BP 148/88 | HR 80 | Temp 97.5°F | Resp 16 | Wt 181.0 lb

## 2019-10-22 DIAGNOSIS — E1121 Type 2 diabetes mellitus with diabetic nephropathy: Secondary | ICD-10-CM

## 2019-10-22 DIAGNOSIS — Z89611 Acquired absence of right leg above knee: Secondary | ICD-10-CM

## 2019-10-22 DIAGNOSIS — I10 Essential (primary) hypertension: Secondary | ICD-10-CM

## 2019-10-22 DIAGNOSIS — E11319 Type 2 diabetes mellitus with unspecified diabetic retinopathy without macular edema: Secondary | ICD-10-CM

## 2019-10-22 DIAGNOSIS — Z794 Long term (current) use of insulin: Secondary | ICD-10-CM | POA: Diagnosis not present

## 2019-10-22 LAB — POCT GLYCOSYLATED HEMOGLOBIN (HGB A1C)
Est. average glucose Bld gHb Est-mCnc: 177
Hemoglobin A1C: 7.8 % — AB (ref 4.0–5.6)

## 2019-10-22 NOTE — Patient Instructions (Signed)
.   Please review the attached list of medications and notify my office if there are any errors.   . Please bring all of your medications to every appointment so we can make sure that our medication list is the same as yours.   

## 2019-10-22 NOTE — Progress Notes (Signed)
Established patient visit   Patient: Robert [name]   DOB: 10-13-1960   59 y.o. Male  MRN: [id number] Visit Date: 10/22/2019  Today's healthcare provider: Lelon Huh, MD   Chief Complaint  Patient presents with   Diabetes   Hypertension   Insomnia   Subjective    HPI Diabetes Mellitus Type II, Follow-up  Lab Results  Component Value Date   HGBA1C 7.5 (A) 06/25/2019   HGBA1C 7.9 (A) 11/04/2018   HGBA1C 7.5 (A) 04/29/2018   Wt Readings from Last 3 Encounters:  10/22/19 181 lb (82.1 kg)  06/25/19 186 lb 9.6 oz (84.6 kg)  01/16/19 182 lb (82.6 kg)   Last seen for diabetes 4 months ago.  Management since then includes continuing same medication. He reports good compliance with treatment. He is not having side effects.  Symptoms: No fatigue No foot ulcerations  No appetite changes No nausea  No paresthesia of the feet  No polydipsia  No polyuria No visual disturbances   No vomiting     Home blood sugar records: blood sugars are not checked  Episodes of hypoglycemia? No    Current insulin regiment:  Insulin Glargine-Lixisenatide (SOLIQUA) 100-33 UNT-MCG/ML, takes 60 units daily Most Recent Eye Exam: 01/22/2019 Current exercise: walking Current diet habits: well balanced  Pertinent Labs: Lab Results  Component Value Date   CHOL 145 11/14/2017   HDL 54 11/14/2017   LDLCALC 75 11/14/2017   TRIG 78 11/14/2017   CHOLHDL 2.7 11/14/2017   Lab Results  Component Value Date   NA 138 04/29/2018   K 4.6 04/29/2018   CREATININE 1.32 (H) 04/29/2018   GFRNONAA 60 04/29/2018   GFRAA 69 04/29/2018   GLUCOSE 149 (H) 04/29/2018     ---------------------------------------------------------------------------------------------------  Hypertension, follow-up  BP Readings from Last 3 Encounters:  10/22/19 (!) 148/88  06/25/19 (!) 147/81  01/16/19 137/81   Wt Readings from Last 3 Encounters:  10/22/19 181 lb (82.1 kg)  06/25/19 186 lb 9.6 oz (84.6 kg)    01/16/19 182 lb (82.6 kg)     He was last seen for hypertension 4 months ago.  BP at that visit was 147/81. Management since that visit includes continuing same medication.  He reports good compliance with treatment. He is not having side effects.  He is following a Regular diet. He is exercising. He does not smoke.  Use of agents associated with hypertension: NSAIDS.   Outside blood pressures are not checked. Symptoms: No chest pain No chest pressure  No palpitations No syncope  No dyspnea No orthopnea  No paroxysmal nocturnal dyspnea No lower extremity edema   Pertinent labs: Lab Results  Component Value Date   CHOL 145 11/14/2017   HDL 54 11/14/2017   LDLCALC 75 11/14/2017   TRIG 78 11/14/2017   CHOLHDL 2.7 11/14/2017   Lab Results  Component Value Date   NA 138 04/29/2018   K 4.6 04/29/2018   CREATININE 1.32 (H) 04/29/2018   GFRNONAA 60 04/29/2018   GFRAA 69 04/29/2018   GLUCOSE 149 (H) 04/29/2018     The 10-year ASCVD risk score Mikey Bussing DC Jr., et al., 2013) is: 24.6%   ---------------------------------------------------------------------------------------------------  Follow up for Insomnia:  The patient was last seen for this 4 months ago. Changes made at last visit include recommending nightly melatonin and as needed hydrOXYzine .  He reports good compliance with treatment. He feels that condition is Improved. He is not having side effects.   -----------------------------------------------------------------------------------------  Medications: Outpatient Medications Prior to Visit  Medication Sig   aspirin EC 81 MG tablet Take 81 mg by mouth daily.   B-D UF III MINI PEN NEEDLES 31G X 5 MM MISC USE 4 TIMES DAILY   baclofen (LIORESAL) 10 MG tablet Take 1 tablet (10 mg total) by mouth 3 (three) times daily.   Blood Glucose Monitoring Suppl (ACCU-CHEK AVIVA PLUS) w/Device KIT Use to check blood sugar three times daily for insulin dependent  diabetes (E11.9)   cyclobenzaprine (FLEXERIL) 5 MG tablet Take 1 tablet (5 mg total) by mouth 3 (three) times daily. Reported on 07/23/2015   diltiazem (TIAZAC) 180 MG 24 hr capsule TAKE 1 CAPSULE BY MOUTH  DAILY   glucose blood test strip Use as instructed to check sugar three times daily for insulin dependent diabets.   hydrOXYzine (ATARAX/VISTARIL) 25 MG tablet TAKE 1 TABLET (25 MG TOTAL) BY MOUTH AT BEDTIME AS NEEDED (INSOMNIA).   ibuprofen (ADVIL) 800 MG tablet Take 1 tablet by mouth twice a day as needed.   Insulin Glargine-Lixisenatide (SOLIQUA) 100-33 UNT-MCG/ML SOPN Inject 60 Units as directed daily.   JARDIANCE 10 MG TABS tablet TAKE 1 TABLET BY MOUTH  DAILY   Lancets (ONETOUCH ULTRASOFT) lancets Use as instructed   lidocaine (LIDODERM) 5 % PLACE 1 PATCH ONTO THE SKIN DAILY. REMOVE & DISCARD PATCH WITHIN 12 HOURS OR AS DIRECTED BY MD   losartan (COZAAR) 50 MG tablet Take 1 tablet (50 mg total) by mouth daily.   meloxicam (MOBIC) 15 MG tablet TAKE 1 TABLET BY MOUTH EVERY DAY   metFORMIN (GLUCOPHAGE) 1000 MG tablet TAKE 1 TABLET BY MOUTH  DAILY   pravastatin (PRAVACHOL) 40 MG tablet TAKE 1 TABLET BY MOUTH  DAILY   sildenafil (VIAGRA) 100 MG tablet Take 0.5-1 tablets (50-100 mg total) by mouth daily as needed for erectile dysfunction.   traMADol (ULTRAM) 50 MG tablet Take 1 tablet (50 mg total) by mouth every 8 (eight) hours as needed.   valACYclovir (VALTREX) 1000 MG tablet Take 1 tablet (1,000 mg total) by mouth 3 (three) times daily.   No facility-administered medications prior to visit.    Review of Systems  Constitutional: Negative for appetite change, chills and fever.  Respiratory: Negative for chest tightness, shortness of breath and wheezing.   Cardiovascular: Negative for chest pain and palpitations.  Gastrointestinal: Negative for abdominal pain, nausea and vomiting.      Objective    BP (!) 148/88 (BP Location: Right Arm, Cuff Size: Large)    Pulse 80     Temp (!) 97.5 F (36.4 C) (Temporal)    Resp 16    Wt 181 lb (82.1 kg)    BMI 30.12 kg/m    Physical Exam   General: Appearance:    Obese male in no acute distress  Eyes:    PERRL, conjunctiva/corneas clear, EOM's intact       Lungs:     Clear to auscultation bilaterally, respirations unlabored  Heart:    Normal heart rate. Normal rhythm. No murmurs, rubs, or gallops.   MS:   Above knee amputation of right lower extremity is noted.   Neurologic:   Awake, alert, oriented x 3. No apparent focal neurological           defect.         Results for orders placed or performed in visit on 10/22/19  POCT HgB A1C  Result Value Ref Range   Hemoglobin A1C 7.8 (A) 4.0 - 5.6 %  Est. average glucose Bld gHb Est-mCnc 177     Assessment & Plan     1. Type 2 diabetes mellitus with retinopathy, with long-term current use of insulin, macular edema presence unspecified, unspecified laterality, unspecified retinopathy severity (HCC) A1c is up a bit, but he is having polyuria with Jardiance. He is to work on improving diet. Same meds for now.  - Comprehensive metabolic panel - Lipid panel - CBC  2. Diabetic nephropathy associated with type 2 diabetes mellitus ([place]) On ARB  3. 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 Essential hypertension Well controlled.  Continue current medications.     Return in about 4 months (around 02/21/2020).      The entirety of the information documented in the History of Present Illness, Review of Systems and Physical Exam were personally obtained by me. Portions of this information were initially documented by the CMA and reviewed by me for thoroughness and accuracy.      Lelon Huh, MD  [facility] [phone] (phone) [phone] (fax)  [place]

## 2019-10-24 ENCOUNTER — Ambulatory Visit: Payer: Managed Care, Other (non HMO) | Admitting: Family Medicine

## 2019-10-24 LAB — LIPID PANEL
Chol/HDL Ratio: 2.1 ratio (ref 0.0–5.0)
Cholesterol, Total: 127 mg/dL (ref 100–199)
HDL: 60 mg/dL (ref >39)
LDL Chol Calc (NIH): 55 mg/dL (ref 0–99)
Triglycerides: 53 mg/dL (ref 0–149)
VLDL Cholesterol Cal: 12 mg/dL (ref 5–40)

## 2019-10-24 LAB — COMPREHENSIVE METABOLIC PANEL
ALT: 21 IU/L (ref 0–44)
AST: 25 IU/L (ref 0–40)
Albumin/Globulin Ratio: 1.9 (ref 1.2–2.2)
Albumin: 4.5 g/dL (ref 3.8–4.9)
Alkaline Phosphatase: 99 IU/L (ref 48–121)
BUN/Creatinine Ratio: 14 (ref 9–20)
BUN: 20 mg/dL (ref 6–24)
Bilirubin Total: 0.4 mg/dL (ref 0.0–1.2)
CO2: 20 mmol/L (ref 20–29)
Calcium: 9.7 mg/dL (ref 8.7–10.2)
Chloride: 102 mmol/L (ref 96–106)
Creatinine, Ser: 1.43 mg/dL — ABNORMAL HIGH (ref 0.76–1.27)
GFR calc Af Amer: 62 mL/min/{1.73_m2} (ref >59)
GFR calc non Af Amer: 54 mL/min/{1.73_m2} — ABNORMAL LOW (ref >59)
Globulin, Total: 2.4 g/dL (ref 1.5–4.5)
Glucose: 147 mg/dL — ABNORMAL HIGH (ref 65–99)
Potassium: 5 mmol/L (ref 3.5–5.2)
Sodium: 140 mmol/L (ref 134–144)
Total Protein: 6.9 g/dL (ref 6.0–8.5)

## 2019-10-24 LAB — CBC
Hematocrit: 42.6 % (ref 37.5–51.0)
Hemoglobin: 14.2 g/dL (ref 13.0–17.7)
MCH: 26.8 pg (ref 26.6–33.0)
MCHC: 33.3 g/dL (ref 31.5–35.7)
MCV: 81 fL (ref 79–97)
Platelets: 289 10*3/uL (ref 150–450)
RBC: 5.29 x10E6/uL (ref 4.14–5.80)
RDW: 13.4 % (ref 11.6–15.4)
WBC: 5.5 10*3/uL (ref 3.4–10.8)

## 2019-12-11 ENCOUNTER — Other Ambulatory Visit: Payer: Self-pay | Admitting: Family Medicine

## 2019-12-11 DIAGNOSIS — IMO0002 Reserved for concepts with insufficient information to code with codable children: Secondary | ICD-10-CM

## 2020-01-22 ENCOUNTER — Other Ambulatory Visit: Payer: Self-pay | Admitting: Family Medicine

## 2020-01-22 DIAGNOSIS — IMO0002 Reserved for concepts with insufficient information to code with codable children: Secondary | ICD-10-CM

## 2020-02-04 ENCOUNTER — Other Ambulatory Visit: Payer: Self-pay | Admitting: Family Medicine

## 2020-02-04 DIAGNOSIS — IMO0002 Reserved for concepts with insufficient information to code with codable children: Secondary | ICD-10-CM

## 2020-02-04 NOTE — Telephone Encounter (Signed)
Requested Prescriptions  Pending Prescriptions Disp Refills  . diltiazem (TIAZAC) 180 MG 24 hr capsule [Pharmacy Med Name: DILTIAZEM  180MG  CAP  24 HR TR] 90 capsule 3    Sig: TAKE 1 CAPSULE BY MOUTH  DAILY     Cardiovascular:  Calcium Channel Blockers Failed - 02/04/2020  5:20 AM      Failed - Last BP in normal range    BP Readings from Last 1 Encounters:  10/22/19 (!) 148/88         Passed - Valid encounter within last 6 months    Recent Outpatient Visits          3 months ago Type 2 diabetes mellitus with retinopathy, with long-term current use of insulin, macular edema presence unspecified, unspecified laterality, unspecified retinopathy severity ([place])   [facility] Birdie Sons, MD   7 months ago Primary insomnia   [facility] Birdie Sons, MD   1 year ago Acute right-sided back pain, unspecified back location   [facility], [place], [place]   1 year ago Uncontrolled type 2 diabetes mellitus with complication, with long-term current use of insulin [facility])   [facility] Birdie Sons, MD   1 year ago Cough   [facility] Birdie Sons, MD      Future Appointments            In 2 weeks Fisher, Kirstie Peri, MD [facility], [facility]           . JARDIANCE 10 MG TABS tablet [Pharmacy Med Name: JARDIANCE  10MG  TAB] 90 tablet 3    Sig: TAKE 1 TABLET BY MOUTH  DAILY     Endocrinology:  Diabetes - SGLT2 Inhibitors Failed - 02/04/2020  5:20 AM      Failed - Cr in normal range and within 360 days    Creatinine, Ser  Date Value Ref Range Status  10/23/2019 1.43 (H) 0.76 - 1.27 mg/dL Final         Failed - LDL in normal range and within 360 days    LDL Chol Calc (NIH)  Date Value Ref Range Status  10/23/2019 55 0 - 99 mg/dL Final         Passed - HBA1C is between 0 and 7.9 and within 180 days    Hemoglobin A1C  Date Value Ref Range Status  10/22/2019 7.8 (A) 4.0 - 5.6 %  Final   Hgb A1c MFr Bld  Date Value Ref Range Status  09/22/2014 9.2 (A) 4.0 - 6.0 % Final         Passed - eGFR in normal range and within 360 days    GFR calc Af Amer  Date Value Ref Range Status  10/23/2019 62 >59 mL/min/1.73 Final    Comment:    **[facility] currently reports eGFR in compliance with the current**   recommendations of the [employer]. [facility] will   update reporting as new guidelines are published from the NKF-ASN   Task force.    GFR calc non Af Amer  Date Value Ref Range Status  10/23/2019 54 (L) >59 mL/min/1.73 Final         Passed - Valid encounter within last 6 months    Recent Outpatient Visits          3 months ago Type 2 diabetes mellitus with retinopathy, with long-term current use of insulin, macular edema presence unspecified, unspecified laterality, unspecified retinopathy  severity [facility])   [facility] Birdie Sons, MD   7 months ago Primary insomnia   [facility] Birdie Sons, MD   1 year ago Acute right-sided back pain, unspecified back location   [facility], [place], [place]   1 year ago Uncontrolled type 2 diabetes mellitus with complication, with long-term current use of insulin [facility])   [facility] Birdie Sons, MD   1 year ago Cough   [facility] Birdie Sons, MD      Future Appointments            In 2 weeks Fisher, Kirstie Peri, MD [facility], [place]

## 2020-02-16 ENCOUNTER — Encounter: Payer: Self-pay | Admitting: Family Medicine

## 2020-02-16 ENCOUNTER — Other Ambulatory Visit: Payer: Self-pay

## 2020-02-16 ENCOUNTER — Ambulatory Visit (INDEPENDENT_AMBULATORY_CARE_PROVIDER_SITE_OTHER): Payer: Managed Care, Other (non HMO) | Admitting: Family Medicine

## 2020-02-16 VITALS — BP 137/80 | HR 105 | Temp 98.5°F | Resp 18 | Ht 65.0 in | Wt 183.0 lb

## 2020-02-16 DIAGNOSIS — S29012D Strain of muscle and tendon of back wall of thorax, subsequent encounter: Secondary | ICD-10-CM | POA: Diagnosis not present

## 2020-02-16 DIAGNOSIS — E663 Overweight: Secondary | ICD-10-CM | POA: Diagnosis not present

## 2020-02-16 DIAGNOSIS — G5603 Carpal tunnel syndrome, bilateral upper limbs: Secondary | ICD-10-CM

## 2020-02-16 MED ORDER — PREDNISONE 10 MG PO TABS: ORAL_TABLET | ORAL | 0 refills | Status: AC

## 2020-02-16 MED ORDER — LIDOCAINE 5 % EX PTCH: 1.0000 | MEDICATED_PATCH | CUTANEOUS | 2 refills | Status: DC

## 2020-02-16 NOTE — Patient Instructions (Addendum)
.   Please review the attached list of medications and notify my office if there are any errors.   . You need to engage in moderate exercise such as brisk walking for an average of 30 minutes per day. Reduce your calory intake by eating smaller meals and not getting second helpings. Avoid high glycemic index foods with a goal  to achieve and maintain a weight below 175 within the next 3 months.     You will be due for [place]ard test to screen for colon cancer in July of 2022. You will get receive a collection kit for Cologuard around the first of July 2022.

## 2020-02-16 NOTE — Progress Notes (Signed)
Established patient visit   Patient: Robert [name]   DOB: 1960-05-30   59 y.o. Male  MRN: [id number] Visit Date: 02/16/2020  Today's healthcare provider: Lelon Huh, MD   Chief Complaint  Patient presents with  . Hand Pain  . Obesity   Subjective    Hand Pain  Incident onset: 2 weeks ago. There was no injury mechanism. The pain is present in the left hand and right hand. Quality: soreness and throbbing pain. The pain does not radiate. The pain is severe. The pain has been intermittent since the incident. Associated symptoms include muscle weakness, numbness and tingling. Pertinent negatives include no chest pain. Nothing aggravates the symptoms. He has tried NSAIDs, acetaminophen and heat for the symptoms. The treatment provided no relief.   Patient says that cold applications have helped to decrease the pain.  Pain is worse in his left hand.   Obesity: Patient is here today requesting completion of a [employer] form due to BMI not at goal.     Medications: Outpatient Medications Prior to Visit  Medication Sig  . aspirin EC 81 MG tablet Take 81 mg by mouth daily.  . B-D UF III MINI PEN NEEDLES 31G X 5 MM MISC USE 4 TIMES DAILY  . baclofen (LIORESAL) 10 MG tablet Take 1 tablet (10 mg total) by mouth 3 (three) times daily.  . Blood Glucose Monitoring Suppl (ACCU-CHEK AVIVA PLUS) w/Device KIT Use to check blood sugar three times daily for insulin dependent diabetes (E11.9)  . diltiazem (TIAZAC) 180 MG 24 hr capsule TAKE 1 CAPSULE BY MOUTH  DAILY  . glucose blood test strip Use as instructed to check sugar three times daily for insulin dependent diabets.  . hydrOXYzine (ATARAX/VISTARIL) 25 MG tablet TAKE 1 TABLET (25 MG TOTAL) BY MOUTH AT BEDTIME AS NEEDED (INSOMNIA).  Marland Kitchen ibuprofen (ADVIL) 800 MG tablet Take 1 tablet by mouth twice a day as needed.  Marland Kitchen JARDIANCE 10 MG TABS tablet TAKE 1 TABLET BY MOUTH  DAILY  . Lancets (ONETOUCH ULTRASOFT) lancets Use as instructed  .  losartan (COZAAR) 50 MG tablet Take 1 tablet (50 mg total) by mouth daily.  . meloxicam (MOBIC) 15 MG tablet TAKE 1 TABLET BY MOUTH EVERY DAY  . metFORMIN (GLUCOPHAGE) 1000 MG tablet TAKE 1 TABLET BY MOUTH  DAILY  . pravastatin (PRAVACHOL) 40 MG tablet TAKE 1 TABLET BY MOUTH  DAILY  . sildenafil (VIAGRA) 100 MG tablet Take 0.5-1 tablets (50-100 mg total) by mouth daily as needed for erectile dysfunction.  . SOLIQUA 100-33 UNT-MCG/ML SOPN INJECT SUBCUTANEOUSLY 60  UNITS AS DIRECTED DAILY.  . traMADol (ULTRAM) 50 MG tablet Take 1 tablet (50 mg total) by mouth every 8 (eight) hours as needed.  . valACYclovir (VALTREX) 1000 MG tablet Take 1 tablet (1,000 mg total) by mouth 3 (three) times daily.  Marland Kitchen lidocaine (LIDODERM) 5 % PLACE 1 PATCH ONTO THE SKIN DAILY. REMOVE & DISCARD PATCH WITHIN 12 HOURS OR AS DIRECTED BY MD (Patient not taking: Reported on 02/16/2020)  . [DISCONTINUED] cyclobenzaprine (FLEXERIL) 5 MG tablet Take 1 tablet (5 mg total) by mouth 3 (three) times daily. Reported on 07/23/2015 (Patient not taking: Reported on 02/16/2020)   No facility-administered medications prior to visit.    Review of Systems  Constitutional: Negative for appetite change, chills and fever.  Respiratory: Negative for chest tightness, shortness of breath and wheezing.   Cardiovascular: Negative for chest pain and palpitations.  Gastrointestinal: Negative for abdominal  pain, nausea and vomiting.  Musculoskeletal: Positive for arthralgias (hand pain).  Neurological: Positive for tingling and numbness.      Objective    BP 137/80 (BP Location: Left Arm, Patient Position: Sitting, Cuff Size: Large)   Pulse (!) 105   Temp 98.5 F (36.9 C) (Oral)   Resp 18   Ht _0  (1.651 m)   Wt 183 lb (83 kg)   BMI 30.45 kg/m    Physical Exam   General: Appearance:    Overweight male in no acute distress  Eyes:    PERRL, conjunctiva/corneas clear, EOM's intact       Lungs:     Clear to auscultation bilaterally,  respirations unlabored  Heart:    Tachycardic. Normal rhythm. No murmurs, rubs, or gallops.   MS:   Above knee amputation of right lower extremity is noted.  Positive Tinels and phalen's sign on left  Neurologic:   Awake, alert, oriented x 3. No apparent focal neurological           defect.          Assessment & Plan     1. Bilateral carpal tunnel syndrome  - Ambulatory referral to Orthopedic Surgery - predniSONE (DELTASONE) 10 MG tablet; 6 tablets for 1 day, then 5 for 1 day, then 4 for 1 day, then 3 for 1 day, then 2 for 1 day then 1 for 1 day.  Dispense: 21 tablet; Refill: 0  2. Strain of latissimus dorsi muscle, subsequent encounter refill- lidocaine (LIDODERM) 5 %; Place 1 patch onto the skin daily. Remove & Discard patch within 12 hours or as directed by MD  Dispense: 30 patch; Refill: 2  3. Overweight He Is to engage in moderate exercise for an average of 30 minutes per day. He is to make a modest reduction in caloric intake and avoid high glycemic index foods with a goal of to achieve and maintain a weight below 175 within the next 3 months. Completed forms for work.     Postponed flu vaccine due to starting prednisone.       The entirety of the information documented in the History of Present Illness, Review of Systems and Physical Exam were personally obtained by me. Portions of this information were initially documented by the CMA and reviewed by me for thoroughness and accuracy.      Lelon Huh, MD  [facility] [phone] (phone) [phone] (fax)  [place]

## 2020-02-19 ENCOUNTER — Encounter: Payer: Self-pay | Admitting: Family Medicine

## 2020-02-19 LAB — HM DIABETES EYE EXAM

## 2020-02-24 ENCOUNTER — Ambulatory Visit: Payer: Managed Care, Other (non HMO) | Admitting: Family Medicine

## 2020-02-26 ENCOUNTER — Other Ambulatory Visit: Payer: Self-pay | Admitting: Family Medicine

## 2020-02-26 DIAGNOSIS — E7800 Pure hypercholesterolemia, unspecified: Secondary | ICD-10-CM

## 2020-03-02 ENCOUNTER — Other Ambulatory Visit: Payer: Self-pay | Admitting: Family Medicine

## 2020-03-02 DIAGNOSIS — E7800 Pure hypercholesterolemia, unspecified: Secondary | ICD-10-CM

## 2020-03-02 NOTE — Telephone Encounter (Signed)
Requested Prescriptions  Pending Prescriptions Disp Refills   pravastatin (PRAVACHOL) 40 MG tablet [Pharmacy Med Name: Pravastatin Sodium 40 MG Oral Tablet] 90 tablet 1    Sig: TAKE 1 TABLET BY MOUTH  DAILY     Cardiovascular:  Antilipid - Statins Failed - 03/02/2020  4:09 AM      Failed - LDL in normal range and within 360 days    LDL Chol Calc (NIH)  Date Value Ref Range Status  10/23/2019 55 0 - 99 mg/dL Final         Passed - Total Cholesterol in normal range and within 360 days    Cholesterol, Total  Date Value Ref Range Status  10/23/2019 127 100 - 199 mg/dL Final         Passed - HDL in normal range and within 360 days    HDL  Date Value Ref Range Status  10/23/2019 60 >39 mg/dL Final         Passed - Triglycerides in normal range and within 360 days    Triglycerides  Date Value Ref Range Status  10/23/2019 53 0 - 149 mg/dL Final         Passed - Patient is not pregnant      Passed - Valid encounter within last 12 months    Recent Outpatient Visits          2 weeks ago Bilateral carpal tunnel syndrome   [facility] Birdie Sons, MD   4 months ago Type 2 diabetes mellitus with retinopathy, with long-term current use of insulin, macular edema presence unspecified, unspecified laterality, unspecified retinopathy severity ([place])   [facility] Birdie Sons, MD   8 months ago Primary insomnia   [facility] Birdie Sons, MD   1 year ago Acute right-sided back pain, unspecified back location   [facility] [place], [place]   1 year ago Uncontrolled type 2 diabetes mellitus with complication, with long-term current use of insulin [facility])   [facility] Birdie Sons, MD      Future Appointments            In 1 week Caryn Section, Kirstie Peri, MD [facility], [facility]           Rx did not transmit to the pharmacy on 02/26/2020.  Therefore, refilling today.

## 2020-03-02 NOTE — Telephone Encounter (Signed)
Requested Prescriptions  Pending Prescriptions Disp Refills   metFORMIN (GLUCOPHAGE) 1000 MG tablet [Pharmacy Med Name: metFORMIN HCl 1000 MG Oral Tablet] 90 tablet 1    Sig: TAKE 1 TABLET BY MOUTH  DAILY     Endocrinology:  Diabetes - Biguanides Failed - 03/02/2020 12:28 AM      Failed - Cr in normal range and within 360 days    Creatinine, Ser  Date Value Ref Range Status  10/23/2019 1.43 (H) 0.76 - 1.27 mg/dL Final         Passed - HBA1C is between 0 and 7.9 and within 180 days    Hemoglobin A1C  Date Value Ref Range Status  10/22/2019 7.8 (A) 4.0 - 5.6 % Final   Hgb A1c MFr Bld  Date Value Ref Range Status  09/22/2014 9.2 (A) 4.0 - 6.0 % Final         Passed - eGFR in normal range and within 360 days    GFR calc Af Amer  Date Value Ref Range Status  10/23/2019 62 >59 mL/min/1.73 Final    Comment:    **[facility] currently reports eGFR in compliance with the current**   recommendations of the [employer]. [facility] will   update reporting as new guidelines are published from the NKF-ASN   Task force.    GFR calc non Af Amer  Date Value Ref Range Status  10/23/2019 54 (L) >59 mL/min/1.73 Final         Passed - Valid encounter within last 6 months    Recent Outpatient Visits          2 weeks ago Bilateral carpal tunnel syndrome   [facility] Birdie Sons, MD   4 months ago Type 2 diabetes mellitus with retinopathy, with long-term current use of insulin, macular edema presence unspecified, unspecified laterality, unspecified retinopathy severity [facility])   [facility] Birdie Sons, MD   8 months ago Primary insomnia   [facility] Birdie Sons, MD   1 year ago Acute right-sided back pain, unspecified back location   [facility], [place], [place]   1 year ago Uncontrolled type 2 diabetes mellitus with complication, with long-term current use of insulin [facility])   [facility] Birdie Sons, MD      Future Appointments            In 1 week Caryn Section, Kirstie Peri, MD [facility], [facility]           RX refill submitted on 02/27/2020 was not received by the pharmacy.  Resubmitting RX refill today.

## 2020-03-04 ENCOUNTER — Other Ambulatory Visit: Payer: Self-pay | Admitting: Family Medicine

## 2020-03-04 DIAGNOSIS — E1165 Type 2 diabetes mellitus with hyperglycemia: Secondary | ICD-10-CM

## 2020-03-04 DIAGNOSIS — IMO0002 Reserved for concepts with insufficient information to code with codable children: Secondary | ICD-10-CM

## 2020-03-04 NOTE — Telephone Encounter (Signed)
Requested Prescriptions  Pending Prescriptions Disp Refills   B-D UF III MINI PEN NEEDLES 31G X 5 MM MISC [Pharmacy Med Name: [id number]] 615 each 4    Sig: USE 4 TIMES DAILY     Endocrinology: Diabetes - Testing Supplies Passed - 03/04/2020  7:50 AM      Passed - Valid encounter within last 12 months    Recent Outpatient Visits          2 weeks ago Bilateral carpal tunnel syndrome   [facility] Birdie Sons, MD   4 months ago Type 2 diabetes mellitus with retinopathy, with long-term current use of insulin, macular edema presence unspecified, unspecified laterality, unspecified retinopathy severity [facility])   [facility] Birdie Sons, MD   8 months ago Primary insomnia   [facility] Birdie Sons, MD   1 year ago Acute right-sided back pain, unspecified back location   [facility], [place], [place]   1 year ago Uncontrolled type 2 diabetes mellitus with complication, with long-term current use of insulin [facility])   [facility] Birdie Sons, MD      Future Appointments            In 1 week Fisher, Kirstie Peri, MD [facility], [place]

## 2020-03-12 ENCOUNTER — Other Ambulatory Visit: Payer: Self-pay

## 2020-03-12 ENCOUNTER — Encounter: Payer: Self-pay | Admitting: Family Medicine

## 2020-03-12 ENCOUNTER — Ambulatory Visit: Payer: Managed Care, Other (non HMO) | Admitting: Family Medicine

## 2020-03-12 VITALS — BP 144/88 | HR 79 | Temp 98.2°F | Resp 16 | Wt 185.0 lb

## 2020-03-12 DIAGNOSIS — E11319 Type 2 diabetes mellitus with unspecified diabetic retinopathy without macular edema: Secondary | ICD-10-CM

## 2020-03-12 DIAGNOSIS — Z23 Encounter for immunization: Secondary | ICD-10-CM | POA: Diagnosis not present

## 2020-03-12 DIAGNOSIS — Z794 Long term (current) use of insulin: Secondary | ICD-10-CM | POA: Diagnosis not present

## 2020-03-12 DIAGNOSIS — IMO0002 Reserved for concepts with insufficient information to code with codable children: Secondary | ICD-10-CM

## 2020-03-12 DIAGNOSIS — E118 Type 2 diabetes mellitus with unspecified complications: Secondary | ICD-10-CM

## 2020-03-12 DIAGNOSIS — E1165 Type 2 diabetes mellitus with hyperglycemia: Secondary | ICD-10-CM

## 2020-03-12 LAB — POCT GLYCOSYLATED HEMOGLOBIN (HGB A1C)
Est. average glucose Bld gHb Est-mCnc: 180
Hemoglobin A1C: 7.9 % — AB (ref 4.0–5.6)

## 2020-03-12 MED ORDER — DILTIAZEM HCL ER BEADS 180 MG PO CP24: 180.0000 mg | ORAL_CAPSULE | Freq: Every day | ORAL | 3 refills | Status: DC

## 2020-03-12 MED ORDER — EMPAGLIFLOZIN 10 MG PO TABS: 10.0000 mg | ORAL_TABLET | Freq: Every day | ORAL | 3 refills | Status: DC

## 2020-03-12 NOTE — Progress Notes (Signed)
Established patient visit   Patient: Robert [name]   DOB: [birth year]   59 y.o. Male  MRN: [id number] Visit Date: 03/12/2020  Today's healthcare provider: Lelon Huh, MD   Chief Complaint  Patient presents with  . Diabetes  . Hypertension   Subjective    HPI  Hypertension, follow-up  BP Readings from Last 3 Encounters:  03/12/20 (!) 144/88  02/16/20 137/80  10/22/19 (!) 148/88   Wt Readings from Last 3 Encounters:  03/12/20 185 lb (83.9 kg)  02/16/20 183 lb (83 kg)  10/22/19 181 lb (82.1 kg)     He was last seen for hypertension 4 months ago.  BP at that visit was 148/88. Management since that visit includes continue same medication.  He reports good compliance with treatment. He is not having side effects.  He is following a Regular diet. He is not exercising. He does not smoke.  Use of agents associated with hypertension: NSAIDS.   Outside blood pressures are not checked. Symptoms: No chest pain No chest pressure  No palpitations No syncope  No dyspnea No orthopnea  No paroxysmal nocturnal dyspnea No lower extremity edema   Pertinent labs: Lab Results  Component Value Date   CHOL 127 10/23/2019   HDL 60 10/23/2019   LDLCALC 55 10/23/2019   TRIG 53 10/23/2019   CHOLHDL 2.1 10/23/2019   Lab Results  Component Value Date   NA 140 10/23/2019   K 5.0 10/23/2019   CREATININE 1.43 (H) 10/23/2019   GFRNONAA 54 (L) 10/23/2019   GFRAA 62 10/23/2019   GLUCOSE 147 (H) 10/23/2019     The ASCVD Risk score (Goff DC Jr., et al., 2013) failed to calculate for the following reasons:   The valid total cholesterol range is 130 to 320 mg/dL   ---------------------------------------------------------------------------------------------------  Diabetes Mellitus Type II, Follow-up  Lab Results  Component Value Date   HGBA1C 7.8 (A) 10/22/2019   HGBA1C 7.5 (A) 06/25/2019   HGBA1C 7.9 (A) 11/04/2018   Wt Readings from Last 3 Encounters:  03/12/20 185 lb  (83.9 kg)  02/16/20 183 lb (83 kg)  10/22/19 181 lb (82.1 kg)   Last seen for diabetes 4 months ago.  Management since then includes working on improving diet. Same meds for now.  He reports good compliance with treatment. He is not having side effects.  Symptoms: No fatigue No foot ulcerations  No appetite changes No nausea  No paresthesia of the feet  No polydipsia  No polyuria No visual disturbances   No vomiting     Home blood sugar records: blood sugars are not checked  Episodes of hypoglycemia? No    Current insulin regiment: none Most Recent Eye Exam: 02/19/2020 Current exercise: none Current diet habits: in general, an "unhealthy" diet  Pertinent Labs: Lab Results  Component Value Date   CHOL 127 10/23/2019   HDL 60 10/23/2019   LDLCALC 55 10/23/2019   TRIG 53 10/23/2019   CHOLHDL 2.1 10/23/2019   Lab Results  Component Value Date   NA 140 10/23/2019   K 5.0 10/23/2019   CREATININE 1.43 (H) 10/23/2019   GFRNONAA 54 (L) 10/23/2019   GFRAA 62 10/23/2019   GLUCOSE 147 (H) 10/23/2019     ---------------------------------------------------------------------------------------------------     Medications: Outpatient Medications Prior to Visit  Medication Sig  . aspirin EC 81 MG tablet Take 81 mg by mouth daily.  . B-D UF III MINI PEN NEEDLES 31G X 5 MM MISC USE 4 TIMES  DAILY  . baclofen (LIORESAL) 10 MG tablet Take 1 tablet (10 mg total) by mouth 3 (three) times daily.  . Blood Glucose Monitoring Suppl (ACCU-CHEK AVIVA PLUS) w/Device KIT Use to check blood sugar three times daily for insulin dependent diabetes (E11.9)  . diltiazem (TIAZAC) 180 MG 24 hr capsule TAKE 1 CAPSULE BY MOUTH  DAILY  . glucose blood test strip Use as instructed to check sugar three times daily for insulin dependent diabets.  . hydrOXYzine (ATARAX/VISTARIL) 25 MG tablet TAKE 1 TABLET (25 MG TOTAL) BY MOUTH AT BEDTIME AS NEEDED (INSOMNIA).  Marland Kitchen ibuprofen (ADVIL) 800 MG tablet Take 1  tablet by mouth twice a day as needed.  Marland Kitchen JARDIANCE 10 MG TABS tablet TAKE 1 TABLET BY MOUTH  DAILY  . Lancets (ONETOUCH ULTRASOFT) lancets Use as instructed  . lidocaine (LIDODERM) 5 % Place 1 patch onto the skin daily. Remove & Discard patch within 12 hours or as directed by MD  . losartan (COZAAR) 50 MG tablet Take 1 tablet (50 mg total) by mouth daily.  . meloxicam (MOBIC) 15 MG tablet TAKE 1 TABLET BY MOUTH EVERY DAY  . metFORMIN (GLUCOPHAGE) 1000 MG tablet TAKE 1 TABLET BY MOUTH  DAILY  . pravastatin (PRAVACHOL) 40 MG tablet TAKE 1 TABLET BY MOUTH  DAILY  . sildenafil (VIAGRA) 100 MG tablet Take 0.5-1 tablets (50-100 mg total) by mouth daily as needed for erectile dysfunction.  . SOLIQUA 100-33 UNT-MCG/ML SOPN INJECT SUBCUTANEOUSLY 60  UNITS AS DIRECTED DAILY.  . traMADol (ULTRAM) 50 MG tablet Take 1 tablet (50 mg total) by mouth every 8 (eight) hours as needed.  . valACYclovir (VALTREX) 1000 MG tablet Take 1 tablet (1,000 mg total) by mouth 3 (three) times daily.   No facility-administered medications prior to visit.  He states he is only taking 1/2 tablet of losartan daily  Review of Systems  Constitutional: Negative for appetite change, chills and fever.  Respiratory: Negative for chest tightness, shortness of breath and wheezing.   Cardiovascular: Negative for chest pain and palpitations.  Gastrointestinal: Negative for abdominal pain, nausea and vomiting.      Objective    BP (!) 144/88 (BP Location: Right Arm)   Pulse 79   Temp 98.2 F (36.8 C) (Oral)   Resp 16   Wt 185 lb (83.9 kg)   BMI 30.79 kg/m    Physical Exam   General: Appearance:    Obese male in no acute distress  Eyes:    PERRL, conjunctiva/corneas clear, EOM's intact       Lungs:     Clear to auscultation bilaterally, respirations unlabored  Heart:    Normal heart rate. Normal rhythm. No murmurs, rubs, or gallops.   MS:   Above knee amputation of right lower extremity is noted.   Neurologic:   Awake,  alert, oriented x 3. No apparent focal neurological           defect.         Results for orders placed or performed in visit on 03/12/20  POCT HgB A1C  Result Value Ref Range   Hemoglobin A1C 7.9 (A) 4.0 - 5.6 %   Est. average glucose Bld gHb Est-mCnc 180     Assessment & Plan     1. Type 2 diabetes mellitus with retinopathy, with long-term current use of insulin, macular edema presence unspecified, unspecified laterality, unspecified retinopathy severity (HCC) Stable on current medications. Having some polyuria with Jardiance. Continue current dose of medications for now.  Consider increasing Jardiance if A1c gets above 8.0  - empagliflozin (JARDIANCE) 10 MG TABS tablet; Take 1 tablet (10 mg total) by mouth daily.  Dispense: 90 tablet; Refill: 3  3. Need for influenza vaccination  - Flu Vaccine QUAD 36+ mos IM (Fluarix/Fluzone)  4. Need for shingles vaccine  - Zoster, Recombinant (Shingrix)  4. Hypertension He is currently only taking 1/2 tablet of losartan daily. Is to go up to 1 full tablet.  Refill- diltiazem (TIAZAC) 180 MG 24 hr capsule; Take 1 capsule (180 mg total) by mouth daily.  Dispense: 90 capsule; Refill: 3     Future Appointments  Date Time Provider [place]  07/12/2020  3:20 PM Birdie Sons, MD [facility]        The entirety of the information documented in the History of Present Illness, Review of Systems and Physical Exam were personally obtained by me. Portions of this information were initially documented by the CMA and reviewed by me for thoroughness and accuracy.      Lelon Huh, MD  [facility] [phone] (phone) [phone] (fax)  [place]

## 2020-03-12 NOTE — Patient Instructions (Signed)
.   Please review the attached list of medications and notify my office if there are any errors.   . Please bring all of your medications to every appointment so we can make sure that our medication list is the same as yours.   

## 2020-03-19 ENCOUNTER — Other Ambulatory Visit: Payer: Self-pay | Admitting: Family Medicine

## 2020-03-19 ENCOUNTER — Telehealth: Payer: Self-pay

## 2020-03-19 MED ORDER — LOSARTAN POTASSIUM 50 MG PO TABS: 50.0000 mg | ORAL_TABLET | Freq: Every day | ORAL | 3 refills | Status: DC

## 2020-03-19 NOTE — Telephone Encounter (Signed)
Done. Copy of immunization report printed and left up front for pick up. Patient advised.

## 2020-03-19 NOTE — Telephone Encounter (Signed)
Medication was sent into the pharmacy.  

## 2020-03-19 NOTE — Telephone Encounter (Signed)
Copied from [place] [id number]. Topic: General - Other >> Mar 19, 2020 10:23 AM Rainey Pines A wrote: Patient would like to pick up a copy of his vaccine records. Please advise

## 2020-03-19 NOTE — Telephone Encounter (Signed)
[facility] faxed refill request for the following medications:  losartan (COZAAR) 50 MG tablet  Last Rx: 02/20/2019 LOV: 03/12/2020 Please advise. Thanks TNP

## 2020-03-31 ENCOUNTER — Other Ambulatory Visit: Payer: Self-pay | Admitting: Physician Assistant

## 2020-03-31 NOTE — Telephone Encounter (Signed)
Requested medications are due for refill today ye  Requested medications are on the active medication list yes  Last refill yes  Last visit 01/2019  Future visit scheduled 06/2020  Notes to clinic Failed protocol of valid visit within 12 months, please assess.

## 2020-04-02 ENCOUNTER — Other Ambulatory Visit: Payer: Self-pay | Admitting: Family Medicine

## 2020-04-02 DIAGNOSIS — IMO0002 Reserved for concepts with insufficient information to code with codable children: Secondary | ICD-10-CM

## 2020-04-02 DIAGNOSIS — E1165 Type 2 diabetes mellitus with hyperglycemia: Secondary | ICD-10-CM

## 2020-04-02 NOTE — Telephone Encounter (Signed)
Requested medication (s) are due for refill today: yes  Requested medication (s) are on the active medication list: yes   Last refill: 01/22/20  67m  1 refill  Future visit scheduled  yes  07/12/20  Notes to clinic:  Failed due to labs (Cr) Please review  Requested Prescriptions  Pending Prescriptions Disp Refills   [id number] UNT-MCG/ML SOPN [Pharmacy Med Name: SOLIQUA  33ML SOLUTION  100 X 5 X 3 X] 45 mL 3    Sig: INJECT SUBCUTANEOUSLY 60  UNITS AS DIRECTED DAILY.      Endocrinology: Diabetes - Insulin + GLP-1 Receptor Agonist Combos 2 Failed - 04/02/2020  9:49 PM      Failed - Cr in normal range and within 360 days    Creatinine, Ser  Date Value Ref Range Status  10/23/2019 1.43 (H) 0.76 - 1.27 mg/dL Final          Passed - HBA1C is between 0 and 7.9 and within 180 days    Hemoglobin A1C  Date Value Ref Range Status  03/12/2020 7.9 (A) 4.0 - 5.6 % Final   Hgb A1c MFr Bld  Date Value Ref Range Status  09/22/2014 9.2 (A) 4.0 - 6.0 % Final          Passed - AA eGFR in normal range and within 360 days    GFR calc Af Amer  Date Value Ref Range Status  10/23/2019 62 >59 mL/min/1.73 Final    Comment:    **[facility] currently reports eGFR in compliance with the current**   recommendations of the [employer] [facility] will   update reporting as new guidelines are published from the NKF-ASN   Task force.    GFR calc non Af Amer  Date Value Ref Range Status  10/23/2019 54 (L) >59 mL/min/1.73 Final          Passed - Valid encounter within last 6 months    Recent Outpatient Visits           3 weeks ago Type 2 diabetes mellitus with retinopathy, with long-term current use of insulin, macular edema presence unspecified, unspecified laterality, unspecified retinopathy severity ([facility]   1 month ago Bilateral carpal tunnel syndrome   [facility]   5 months ago Type 2  diabetes mellitus with retinopathy, with long-term current use of insulin, macular edema presence unspecified, unspecified laterality, unspecified retinopathy severity ([facility]   [facility]   9 months ago Primary insomnia   [facility]   1 year ago Acute right-sided back pain, unspecified back location   [facility] [place]      Future Appointments             In 3 months Fisher, DKirstie Peri MD [facility] [place]

## 2020-06-02 ENCOUNTER — Other Ambulatory Visit: Payer: Self-pay | Admitting: Family Medicine

## 2020-06-02 DIAGNOSIS — F5101 Primary insomnia: Secondary | ICD-10-CM

## 2020-07-12 ENCOUNTER — Ambulatory Visit: Payer: Managed Care, Other (non HMO) | Admitting: Family Medicine

## 2020-07-12 NOTE — Progress Notes (Deleted)
Established patient visit   Patient: Robert [name]   DOB: 01-20-1961   60 y.o. Male  MRN: [id number] Visit Date: 07/12/2020  Today's healthcare provider: Lelon Huh, MD   No chief complaint on file.  Subjective    HPI  Diabetes Mellitus Type II, Follow-up  Lab Results  Component Value Date   HGBA1C 7.9 (A) 03/12/2020   HGBA1C 7.8 (A) 10/22/2019   HGBA1C 7.5 (A) 06/25/2019   Wt Readings from Last 3 Encounters:  03/12/20 185 lb (83.9 kg)  02/16/20 183 lb (83 kg)  10/22/19 181 lb (82.1 kg)   Last seen for diabetes 4 months ago.  Management since then includes continue same medications. He reports {excellent/good/fair/poor:19665} compliance with treatment. He {is/is not:21021397} having side effects. {document side effects if present:1} Symptoms: {Yes/No:20286} fatigue {Yes/No:20286} foot ulcerations  {Yes/No:20286} appetite changes {Yes/No:20286} nausea  {Yes/No:20286} paresthesia of the feet  {Yes/No:20286} polydipsia  {Yes/No:20286} polyuria {Yes/No:20286} visual disturbances   {Yes/No:20286} vomiting     Home blood sugar records: {diabetes glucometry results:16657}  Episodes of hypoglycemia? {Yes/No:20286} {enter symptoms and frequency of symptoms if yes:1}   Current insulin regiment: none Most Recent Eye Exam: 02/19/2020 {Current exercise:16438:::1} {Current diet habits:16563:::1}  Pertinent Labs: Lab Results  Component Value Date   CHOL 127 10/23/2019   HDL 60 10/23/2019   LDLCALC 55 10/23/2019   TRIG 53 10/23/2019   CHOLHDL 2.1 10/23/2019   Lab Results  Component Value Date   NA 140 10/23/2019   K 5.0 10/23/2019   CREATININE 1.43 (H) 10/23/2019   GFRNONAA 54 (L) 10/23/2019   GFRAA 62 10/23/2019   GLUCOSE 147 (H) 10/23/2019     ---------------------------------------------------------------------------------------------------  Hypertension, follow-up  BP Readings from Last 3 Encounters:  03/12/20 (!) 144/88  02/16/20 137/80  10/22/19  (!) 148/88   Wt Readings from Last 3 Encounters:  03/12/20 185 lb (83.9 kg)  02/16/20 183 lb (83 kg)  10/22/19 181 lb (82.1 kg)     He was last seen for hypertension 4 months ago.  BP at that visit was 144/88. Management since that visit includes increasing diltiazem to 1 full tablet daily.  He reports {excellent/good/fair/poor:19665} compliance with treatment. He {is/is not:9024} having side effects. {document side effects if present:1} He is following a {diet:21022986} diet. He {is/is not:9024} exercising. He {does/does not:200015} smoke.  Use of agents associated with hypertension: NSAIDS.   Outside blood pressures are {***enter patient reported home BP readings, or 'not being checked':1}. Symptoms: {Yes/No:20286} chest pain {Yes/No:20286} chest pressure  {Yes/No:20286} palpitations {Yes/No:20286} syncope  {Yes/No:20286} dyspnea {Yes/No:20286} orthopnea  {Yes/No:20286} paroxysmal nocturnal dyspnea {Yes/No:20286} lower extremity edema   Pertinent labs: Lab Results  Component Value Date   CHOL 127 10/23/2019   HDL 60 10/23/2019   LDLCALC 55 10/23/2019   TRIG 53 10/23/2019   CHOLHDL 2.1 10/23/2019   Lab Results  Component Value Date   NA 140 10/23/2019   K 5.0 10/23/2019   CREATININE 1.43 (H) 10/23/2019   GFRNONAA 54 (L) 10/23/2019   GFRAA 62 10/23/2019   GLUCOSE 147 (H) 10/23/2019     The ASCVD Risk score (Goff DC Jr., et al., 2013) failed to calculate for the following reasons:   The valid total cholesterol range is 130 to 320 mg/dL   ---------------------------------------------------------------------------------------------------  {Show patient history (optional):23778::" "}   Medications: Outpatient Medications Prior to Visit  Medication Sig  . aspirin EC 81 MG tablet Take 81 mg by mouth daily.  . B-D UF III MINI  PEN NEEDLES 31G X 5 MM MISC USE 4 TIMES DAILY  . baclofen (LIORESAL) 10 MG tablet Take 1 tablet (10 mg total) by mouth 3 (three) times daily.  .  Blood Glucose Monitoring Suppl (ACCU-CHEK AVIVA PLUS) w/Device KIT Use to check blood sugar three times daily for insulin dependent diabetes (E11.9)  . diltiazem (TIAZAC) 180 MG 24 hr capsule Take 1 capsule (180 mg total) by mouth daily.  . empagliflozin (JARDIANCE) 10 MG TABS tablet Take 1 tablet (10 mg total) by mouth daily.  Marland Kitchen glucose blood test strip Use as instructed to check sugar three times daily for insulin dependent diabets.  . hydrOXYzine (ATARAX/VISTARIL) 25 MG tablet TAKE 1 TABLET (25 MG TOTAL) BY MOUTH AT BEDTIME AS NEEDED (INSOMNIA).  Marland Kitchen ibuprofen (ADVIL) 800 MG tablet TAKE 1 TABLET BY MOUTH  TWICE DAILY AS NEEDED  . Lancets (ONETOUCH ULTRASOFT) lancets Use as instructed  . lidocaine (LIDODERM) 5 % Place 1 patch onto the skin daily. Remove & Discard patch within 12 hours or as directed by MD  . losartan (COZAAR) 50 MG tablet Take 1 tablet (50 mg total) by mouth daily.  . meloxicam (MOBIC) 15 MG tablet TAKE 1 TABLET BY MOUTH EVERY DAY  . metFORMIN (GLUCOPHAGE) 1000 MG tablet TAKE 1 TABLET BY MOUTH  DAILY  . pravastatin (PRAVACHOL) 40 MG tablet TAKE 1 TABLET BY MOUTH  DAILY  . sildenafil (VIAGRA) 100 MG tablet Take 0.5-1 tablets (50-100 mg total) by mouth daily as needed for erectile dysfunction.  . SOLIQUA 100-33 UNT-MCG/ML SOPN INJECT SUBCUTANEOUSLY 60  UNITS AS DIRECTED DAILY.  . traMADol (ULTRAM) 50 MG tablet Take 1 tablet (50 mg total) by mouth every 8 (eight) hours as needed.  . valACYclovir (VALTREX) 1000 MG tablet Take 1 tablet (1,000 mg total) by mouth 3 (three) times daily.   No facility-administered medications prior to visit.    Review of Systems  {Labs  Heme  Chem  Endocrine  Serology  Results Review (optional):23779::" "}   Objective    There were no vitals taken for this visit. {Show previous vital signs (optional):23777::" "}   Physical Exam  ***  No results found for any visits on 07/12/20.  Assessment & Plan     ***  No follow-ups on file.       {provider attestation***:1}   Lelon Huh, MD  [facility] [phone] (phone) [phone] (fax)  [place]

## 2020-07-20 ENCOUNTER — Other Ambulatory Visit: Payer: Self-pay

## 2020-07-20 ENCOUNTER — Ambulatory Visit: Payer: Managed Care, Other (non HMO) | Admitting: Family Medicine

## 2020-07-20 VITALS — BP 144/68 | HR 78 | Temp 97.0°F | Ht 65.0 in | Wt 181.6 lb

## 2020-07-20 DIAGNOSIS — Z23 Encounter for immunization: Secondary | ICD-10-CM | POA: Diagnosis not present

## 2020-07-20 DIAGNOSIS — Z794 Long term (current) use of insulin: Secondary | ICD-10-CM

## 2020-07-20 DIAGNOSIS — N529 Male erectile dysfunction, unspecified: Secondary | ICD-10-CM

## 2020-07-20 DIAGNOSIS — E11319 Type 2 diabetes mellitus with unspecified diabetic retinopathy without macular edema: Secondary | ICD-10-CM

## 2020-07-20 DIAGNOSIS — I10 Essential (primary) hypertension: Secondary | ICD-10-CM | POA: Diagnosis not present

## 2020-07-20 LAB — POCT GLYCOSYLATED HEMOGLOBIN (HGB A1C)
Estimated Average Glucose: 151
Hemoglobin A1C: 6.9 % — AB (ref 4.0–5.6)

## 2020-07-20 MED ORDER — SILDENAFIL CITRATE 100 MG PO TABS: 50.0000 mg | ORAL_TABLET | Freq: Every day | ORAL | 5 refills | Status: DC | PRN

## 2020-07-20 NOTE — Progress Notes (Signed)
Established patient visit   Patient: Robert [name]   DOB: [birth year]   60 y.o. Male  MRN: [id number] Visit Date: 07/20/2020  Today's healthcare provider: Lelon Huh, MD   Chief Complaint  Patient presents with  . Diabetes   Subjective    HPI  Diabetes Mellitus Type II, Follow-up  Lab Results  Component Value Date   HGBA1C 7.9 (A) 03/12/2020   HGBA1C 7.8 (A) 10/22/2019   HGBA1C 7.5 (A) 06/25/2019   Wt Readings from Last 3 Encounters:  07/20/20 181 lb 9.6 oz (82.4 kg)  03/12/20 185 lb (83.9 kg)  02/16/20 183 lb (83 kg)   Last seen for diabetes 4 months ago.  Management since then includes continue current dose of medications. Consider increasing Jardiance if A1c gets above 8.0. He reports excellent compliance with treatment. He is not having side effects.  Symptoms: No fatigue No foot ulcerations  No appetite changes No nausea  No paresthesia of the feet  No polydipsia  No polyuria No visual disturbances   No vomiting     Home blood sugar records: n/a  Episodes of hypoglycemia? No    Current insulin regiment: 60 units every morning Most Recent Eye Exam: 02/19/2020 Current exercise: walking - at work Current diet habits: balance and unbalanced at times  Pertinent Labs: Lab Results  Component Value Date   CHOL 127 10/23/2019   HDL 60 10/23/2019   LDLCALC 55 10/23/2019   TRIG 53 10/23/2019   CHOLHDL 2.1 10/23/2019   Lab Results  Component Value Date   NA 140 10/23/2019   K 5.0 10/23/2019   CREATININE 1.43 (H) 10/23/2019   GFRNONAA 54 (L) 10/23/2019   GFRAA 62 10/23/2019   GLUCOSE 147 (H) 10/23/2019     ---------------------------------------------------------------------------------------------------      Medications: Outpatient Medications Prior to Visit  Medication Sig  . aspirin EC 81 MG tablet Take 81 mg by mouth daily.  . B-D UF III MINI PEN NEEDLES 31G X 5 MM MISC USE 4 TIMES DAILY  . baclofen (LIORESAL) 10 MG tablet Take 1  tablet (10 mg total) by mouth 3 (three) times daily.  . Blood Glucose Monitoring Suppl (ACCU-CHEK AVIVA PLUS) w/Device KIT Use to check blood sugar three times daily for insulin dependent diabetes (E11.9)  . diltiazem (TIAZAC) 180 MG 24 hr capsule Take 1 capsule (180 mg total) by mouth daily.  . empagliflozin (JARDIANCE) 10 MG TABS tablet Take 1 tablet (10 mg total) by mouth daily.  Marland Kitchen glucose blood test strip Use as instructed to check sugar three times daily for insulin dependent diabets.  . hydrOXYzine (ATARAX/VISTARIL) 25 MG tablet TAKE 1 TABLET (25 MG TOTAL) BY MOUTH AT BEDTIME AS NEEDED (INSOMNIA).  Marland Kitchen ibuprofen (ADVIL) 800 MG tablet TAKE 1 TABLET BY MOUTH  TWICE DAILY AS NEEDED  . Lancets (ONETOUCH ULTRASOFT) lancets Use as instructed  . lidocaine (LIDODERM) 5 % Place 1 patch onto the skin daily. Remove & Discard patch within 12 hours or as directed by MD  . losartan (COZAAR) 50 MG tablet Take 1 tablet (50 mg total) by mouth daily.  . meloxicam (MOBIC) 15 MG tablet TAKE 1 TABLET BY MOUTH EVERY DAY  . metFORMIN (GLUCOPHAGE) 1000 MG tablet TAKE 1 TABLET BY MOUTH  DAILY  . pravastatin (PRAVACHOL) 40 MG tablet TAKE 1 TABLET BY MOUTH  DAILY  . sildenafil (VIAGRA) 100 MG tablet Take 0.5-1 tablets (50-100 mg total) by mouth daily as needed for erectile dysfunction.  Marland Kitchen  SOLIQUA 100-33 UNT-MCG/ML SOPN INJECT SUBCUTANEOUSLY 60  UNITS AS DIRECTED DAILY.  . traMADol (ULTRAM) 50 MG tablet Take 1 tablet (50 mg total) by mouth every 8 (eight) hours as needed.  . valACYclovir (VALTREX) 1000 MG tablet Take 1 tablet (1,000 mg total) by mouth 3 (three) times daily.   No facility-administered medications prior to visit.        Objective    BP (!) 148/71 (BP Location: Right Arm, Patient Position: Sitting, Cuff Size: Large)   Pulse 78   Temp (!) 97 F (36.1 C) (Temporal)   Ht '5\' 5"'  (1.651 m)   Wt 181 lb 9.6 oz (82.4 kg)   SpO2 100%   BMI 30.22 kg/m     Physical Exam    General: Appearance:     Obese male in no acute distress  Eyes:    PERRL, conjunctiva/corneas clear, EOM's intact       Lungs:     Clear to auscultation bilaterally, respirations unlabored  Heart:    Normal heart rate. Normal rhythm. No murmurs, rubs, or gallops.   MS:   Above knee amputation of right lower extremity is noted.   Neurologic:   Awake, alert, oriented x 3. No apparent focal neurological           defect.        Results for orders placed or performed in visit on 07/20/20  POCT HgB A1C  Result Value Ref Range   Hemoglobin A1C 6.9 (A) 4.0 - 5.6 %   Estimated Average Glucose 151     Assessment & Plan     1. Type 2 diabetes mellitus with retinopathy, with long-term current use of insulin, macular edema presence unspecified, unspecified laterality, unspecified retinopathy severity ([place]) Well controlled.  Continue current medications.    2. Primary hypertension Not at goal. Will change losartan to losartan-hctz with next refill due in April.   3. ED (erectile dysfunction) of organic origin He requests refill - sildenafil (VIAGRA) 100 MG tablet; Take 0.5-1 tablets (50-100 mg total) by mouth daily as needed for erectile dysfunction.  Dispense: 10 tablet; Refill: 5  4. Need for shingles vaccine  - Administer Zoster, Recombinant (Shingrix) Vaccine #2      Future Appointments  Date Time Provider [place]  10/27/2020 10:40 AM Caryn Section, Kirstie Peri, MD [facility]      Lelon Huh, MD  [facility] [phone] (phone) [phone] (fax)  [place]

## 2020-07-20 NOTE — Patient Instructions (Addendum)
.   Please review the attached list of medications and notify my office if there are any errors.    Please bring all of your medications to every appointment so we can make sure that our medication list is the same as yours.   You will be due for a Cologuard stool test in July of this year. Expect the collection to arrive at your home in July.

## 2020-08-16 ENCOUNTER — Other Ambulatory Visit: Payer: Self-pay | Admitting: Family Medicine

## 2020-08-16 DIAGNOSIS — I10 Essential (primary) hypertension: Secondary | ICD-10-CM

## 2020-08-16 MED ORDER — LOSARTAN POTASSIUM-HCTZ 50-12.5 MG PO TABS: 1.0000 | ORAL_TABLET | Freq: Every day | ORAL | 3 refills | Status: DC

## 2020-08-23 ENCOUNTER — Other Ambulatory Visit: Payer: Self-pay | Admitting: Family Medicine

## 2020-08-23 DIAGNOSIS — E7800 Pure hypercholesterolemia, unspecified: Secondary | ICD-10-CM

## 2020-09-06 ENCOUNTER — Ambulatory Visit: Payer: Managed Care, Other (non HMO) | Admitting: Family Medicine

## 2020-09-24 ENCOUNTER — Ambulatory Visit (INDEPENDENT_AMBULATORY_CARE_PROVIDER_SITE_OTHER): Payer: Managed Care, Other (non HMO) | Admitting: Family Medicine

## 2020-09-24 ENCOUNTER — Other Ambulatory Visit: Payer: Self-pay

## 2020-09-24 ENCOUNTER — Encounter: Payer: Self-pay | Admitting: Family Medicine

## 2020-09-24 VITALS — BP 136/72 | HR 88 | Temp 97.9°F | Resp 16 | Wt 182.0 lb

## 2020-09-24 DIAGNOSIS — S29012A Strain of muscle and tendon of back wall of thorax, initial encounter: Secondary | ICD-10-CM

## 2020-09-24 DIAGNOSIS — M25532 Pain in left wrist: Secondary | ICD-10-CM

## 2020-09-24 DIAGNOSIS — M25512 Pain in left shoulder: Secondary | ICD-10-CM | POA: Diagnosis not present

## 2020-09-24 DIAGNOSIS — G8929 Other chronic pain: Secondary | ICD-10-CM

## 2020-09-24 DIAGNOSIS — M25522 Pain in left elbow: Secondary | ICD-10-CM

## 2020-09-24 MED ORDER — MELOXICAM 15 MG PO TABS: 1.0000 | ORAL_TABLET | Freq: Every day | ORAL | 1 refills | Status: DC | PRN

## 2020-09-24 NOTE — Progress Notes (Signed)
Established patient visit   Patient: Robert [name]   DOB: 1960-12-23   60 y.o. Male  MRN: [id number] Visit Date: 09/24/2020  Today's healthcare provider: Lelon Huh, MD   Chief Complaint  Patient presents with  . Joint Pain  . Shoulder Pain   Subjective    Shoulder Pain  The pain is present in the left shoulder, left arm, left elbow and left wrist. This is a new problem. The current episode started more than 1 month ago. The problem occurs constantly. The problem has been gradually worsening. The quality of the pain is described as aching. The pain is mild. Associated symptoms include joint swelling, a limited range of motion and stiffness. Pertinent negatives include no numbness. The symptoms are aggravated by activity. He has tried acetaminophen, NSAIDS and rest for the symptoms. The treatment provided mild relief.     Patient also mentions that the pain radiates to his left hand. It is painful for him to grip anything.      Medications: Outpatient Medications Prior to Visit  Medication Sig  . aspirin EC 81 MG tablet Take 81 mg by mouth daily.  . B-D UF III MINI PEN NEEDLES 31G X 5 MM MISC USE 4 TIMES DAILY  . Blood Glucose Monitoring Suppl (ACCU-CHEK AVIVA PLUS) w/Device KIT Use to check blood sugar three times daily for insulin dependent diabetes (E11.9)  . diltiazem (TIAZAC) 180 MG 24 hr capsule Take 1 capsule (180 mg total) by mouth daily.  . empagliflozin (JARDIANCE) 10 MG TABS tablet Take 1 tablet (10 mg total) by mouth daily.  Marland Kitchen glucose blood test strip Use as instructed to check sugar three times daily for insulin dependent diabets.  . hydrOXYzine (ATARAX/VISTARIL) 25 MG tablet TAKE 1 TABLET (25 MG TOTAL) BY MOUTH AT BEDTIME AS NEEDED (INSOMNIA).  Marland Kitchen ibuprofen (ADVIL) 800 MG tablet TAKE 1 TABLET BY MOUTH  TWICE DAILY AS NEEDED  . Lancets (ONETOUCH ULTRASOFT) lancets Use as instructed  . lidocaine (LIDODERM) 5 % Place 1 patch onto the skin daily. Remove & Discard  patch within 12 hours or as directed by MD  . losartan-hydrochlorothiazide (HYZAAR) 50-12.5 MG tablet Take 1 tablet by mouth daily. TAKE IN PLACE OF LOSARTAN 50MG  . metFORMIN (GLUCOPHAGE) 1000 MG tablet TAKE 1 TABLET BY MOUTH  DAILY  . pravastatin (PRAVACHOL) 40 MG tablet TAKE 1 TABLET BY MOUTH  DAILY  . sildenafil (VIAGRA) 100 MG tablet Take 0.5-1 tablets (50-100 mg total) by mouth daily as needed for erectile dysfunction.  . SOLIQUA 100-33 UNT-MCG/ML SOPN INJECT SUBCUTANEOUSLY 60  UNITS AS DIRECTED DAILY.  . traMADol (ULTRAM) 50 MG tablet Take 1 tablet (50 mg total) by mouth every 8 (eight) hours as needed.  . valACYclovir (VALTREX) 1000 MG tablet Take 1 tablet (1,000 mg total) by mouth 3 (three) times daily.  . baclofen (LIORESAL) 10 MG tablet Take 1 tablet (10 mg total) by mouth 3 (three) times daily. (Patient not taking: Reported on 09/24/2020)  . meloxicam (MOBIC) 15 MG tablet TAKE 1 TABLET BY MOUTH EVERY DAY (Patient not taking: Reported on 09/24/2020)   No facility-administered medications prior to visit.    Review of Systems  Musculoskeletal: Positive for stiffness.  Neurological: Negative for numbness.       Objective    BP 136/72   Pulse 88   Temp 97.9 F (36.6 C)   Resp 16   Wt 182 lb (82.6 kg)   BMI 30.29 kg/m  Physical Exam  From of both UEs. No erythema. No swelling. Pain with joint movement of hands and wrist. Negative tinel's, negative. Tender over bilateral lats.     Assessment & Plan     1. Chronic left shoulder pain  - ANA w/Reflex if Positive - Rheumatoid factor - Sedimentation rate - C-reactive protein - CBC  2. Left elbow pain  - ANA w/Reflex if Positive - Rheumatoid factor - Sedimentation rate - C-reactive protein - CBC  3. Left wrist pain  - ANA w/Reflex if Positive - Rheumatoid factor - Sedimentation rate - C-reactive protein - CBC  4. Strain of latissimus dorsi muscle, initial encounter  - meloxicam (MOBIC) 15 MG tablet;  Take 1 tablet (15 mg total) by mouth daily as needed (joint pain).  Dispense: 30 tablet; Refill: 1   Consider rheumatology referral.       The entirety of the information documented in the History of Present Illness, Review of Systems and Physical Exam were personally obtained by me. Portions of this information were initially documented by the CMA and reviewed by me for thoroughness and accuracy.      Lelon Huh, MD  [facility] [phone] (phone) [phone] (fax)  [place]

## 2020-09-24 NOTE — Patient Instructions (Signed)
.   Stop taking ibuprofen while you are taking meloxicam

## 2020-09-25 LAB — CBC
Hematocrit: 43 % (ref 37.5–51.0)
Hemoglobin: 14.4 g/dL (ref 13.0–17.7)
MCH: 27.3 pg (ref 26.6–33.0)
MCHC: 33.5 g/dL (ref 31.5–35.7)
MCV: 81 fL (ref 79–97)
Platelets: 317 10*3/uL (ref 150–450)
RBC: 5.28 x10E6/uL (ref 4.14–5.80)
RDW: 13.2 % (ref 11.6–15.4)
WBC: 4.5 10*3/uL (ref 3.4–10.8)

## 2020-09-25 LAB — RHEUMATOID FACTOR: Rheumatoid fact SerPl-aCnc: <10 IU/mL (ref <14.0)

## 2020-09-25 LAB — ANA W/REFLEX IF POSITIVE: Anti Nuclear Antibody (ANA): NEGATIVE

## 2020-09-25 LAB — C-REACTIVE PROTEIN: CRP: 1 mg/L (ref 0–10)

## 2020-09-25 LAB — SEDIMENTATION RATE: Sed Rate: 2 mm/hr (ref 0–30)

## 2020-09-29 ENCOUNTER — Telehealth: Payer: Self-pay | Admitting: Family Medicine

## 2020-09-29 DIAGNOSIS — M546 Pain in thoracic spine: Secondary | ICD-10-CM

## 2020-09-29 NOTE — Telephone Encounter (Signed)
Patient has made an additional call regarding this matter  Patient shares that they are feeling no relief with the current medication  Please contact to further advise when possible

## 2020-09-29 NOTE — Telephone Encounter (Signed)
Patient called to request a different pain medication that will help him get through the night.  He stated he is having a lot of pain at night and it is difficult to sleep.  Please advise and call to discuss at [phone]

## 2020-09-30 MED ORDER — TRAMADOL HCL 50 MG PO TABS: 50.0000 mg | ORAL_TABLET | Freq: Three times a day (TID) | ORAL | 0 refills | Status: AC | PRN

## 2020-09-30 NOTE — Telephone Encounter (Signed)
I sent prescription for tramadol, but this can drowsiness and should only be taken during the day. Recommend referral to rheumatology for further workup.

## 2020-10-01 NOTE — Telephone Encounter (Signed)
Advised patient as below. Patient declined placing a referral to rheumatology. He wants to see if medication will help first.

## 2020-10-27 ENCOUNTER — Encounter: Payer: Self-pay | Admitting: Family Medicine

## 2020-10-27 ENCOUNTER — Ambulatory Visit: Payer: Managed Care, Other (non HMO) | Admitting: Family Medicine

## 2020-10-27 ENCOUNTER — Other Ambulatory Visit: Payer: Self-pay

## 2020-10-27 VITALS — BP 125/79 | HR 85 | Temp 98.2°F | Resp 16 | Wt 182.0 lb

## 2020-10-27 DIAGNOSIS — Z125 Encounter for screening for malignant neoplasm of prostate: Secondary | ICD-10-CM

## 2020-10-27 DIAGNOSIS — I10 Essential (primary) hypertension: Secondary | ICD-10-CM

## 2020-10-27 DIAGNOSIS — E1121 Type 2 diabetes mellitus with diabetic nephropathy: Secondary | ICD-10-CM

## 2020-10-27 DIAGNOSIS — S29012A Strain of muscle and tendon of back wall of thorax, initial encounter: Secondary | ICD-10-CM

## 2020-10-27 MED ORDER — MELOXICAM 15 MG PO TABS: 1.0000 | ORAL_TABLET | Freq: Every day | ORAL | 5 refills | Status: DC | PRN

## 2020-10-27 NOTE — Progress Notes (Signed)
Established patient visit   Patient: Robert [name]   DOB: [birth year]   60 y.o. Male  MRN: [id number] Visit Date: 10/27/2020  Today's healthcare provider: Lelon Huh, MD   Chief Complaint  Patient presents with   Diabetes   Hypertension   Subjective    HPI  Diabetes Mellitus Type II, Follow-up  Lab Results  Component Value Date   HGBA1C 6.9 (A) 07/20/2020   HGBA1C 7.9 (A) 03/12/2020   HGBA1C 7.8 (A) 10/22/2019   Wt Readings from Last 3 Encounters:  10/27/20 182 lb (82.6 kg)  09/24/20 182 lb (82.6 kg)  07/20/20 181 lb 9.6 oz (82.4 kg)   Last seen for diabetes 3 months ago.  Management since then includes continue same medication. He reports good compliance with treatment. He is not having side effects.  Symptoms: No fatigue No foot ulcerations  No appetite changes No nausea  No paresthesia of the feet  No polydipsia  No polyuria No visual disturbances   No vomiting     Home blood sugar records:  blood sugars are not checked  Episodes of hypoglycemia? No    Current insulin regiment: Soliqua 60 units daily Most Recent Eye Exam: 02/19/2020 Current exercise: none Current diet habits: well balanced  Pertinent Labs: Lab Results  Component Value Date   CHOL 127 10/23/2019   HDL 60 10/23/2019   LDLCALC 55 10/23/2019   TRIG 53 10/23/2019   CHOLHDL 2.1 10/23/2019   Lab Results  Component Value Date   NA 140 10/23/2019   K 5.0 10/23/2019   CREATININE 1.43 (H) 10/23/2019   GFRNONAA 54 (L) 10/23/2019   GFRAA 62 10/23/2019   GLUCOSE 147 (H) 10/23/2019     ---------------------------------------------------------------------------------------------------   Hypertension, follow-up  BP Readings from Last 3 Encounters:  10/27/20 125/79  09/24/20 136/72  07/20/20 (!) 144/68   Wt Readings from Last 3 Encounters:  10/27/20 182 lb (82.6 kg)  09/24/20 182 lb (82.6 kg)  07/20/20 181 lb 9.6 oz (82.4 kg)     He was last seen for hypertension 3 months  ago.  BP at that visit was 144/68. Management since that visit includes changing losartan to losartan-hctz with next refill due in April.  He reports good compliance with treatment. He is not having side effects.  He is following a Regular diet. He is not exercising. He does not smoke.  Use of agents associated with hypertension: none.   Outside blood pressures are not checked. Symptoms: No chest pain No chest pressure  No palpitations No syncope  No dyspnea No orthopnea  No paroxysmal nocturnal dyspnea No lower extremity edema   Pertinent labs: Lab Results  Component Value Date   CHOL 127 10/23/2019   HDL 60 10/23/2019   LDLCALC 55 10/23/2019   TRIG 53 10/23/2019   CHOLHDL 2.1 10/23/2019   Lab Results  Component Value Date   NA 140 10/23/2019   K 5.0 10/23/2019   CREATININE 1.43 (H) 10/23/2019   GFRNONAA 54 (L) 10/23/2019   GFRAA 62 10/23/2019   GLUCOSE 147 (H) 10/23/2019     The ASCVD Risk score (Goff DC Jr., et al., 2013) failed to calculate for the following reasons:   The valid total cholesterol range is 130 to 320 mg/dL   ---------------------------------------------------------------------------------------------------      Medications: Outpatient Medications Prior to Visit  Medication Sig   aspirin EC 81 MG tablet Take 81 mg by mouth daily.   B-D UF III MINI PEN NEEDLES  31G X 5 MM MISC USE 4 TIMES DAILY   baclofen (LIORESAL) 10 MG tablet Take 1 tablet (10 mg total) by mouth 3 (three) times daily.   Blood Glucose Monitoring Suppl (ACCU-CHEK AVIVA PLUS) w/Device KIT Use to check blood sugar three times daily for insulin dependent diabetes (E11.9)   diltiazem (TIAZAC) 180 MG 24 hr capsule Take 1 capsule (180 mg total) by mouth daily.   empagliflozin (JARDIANCE) 10 MG TABS tablet Take 1 tablet (10 mg total) by mouth daily.   glucose blood test strip Use as instructed to check sugar three times daily for insulin dependent diabets.   hydrOXYzine  (ATARAX/VISTARIL) 25 MG tablet TAKE 1 TABLET (25 MG TOTAL) BY MOUTH AT BEDTIME AS NEEDED (INSOMNIA).   ibuprofen (ADVIL) 800 MG tablet TAKE 1 TABLET BY MOUTH  TWICE DAILY AS NEEDED   Lancets (ONETOUCH ULTRASOFT) lancets Use as instructed   lidocaine (LIDODERM) 5 % Place 1 patch onto the skin daily. Remove & Discard patch within 12 hours or as directed by MD   losartan-hydrochlorothiazide (HYZAAR) 50-12.5 MG tablet Take 1 tablet by mouth daily. TAKE IN PLACE OF LOSARTAN 50MG   meloxicam (MOBIC) 15 MG tablet Take 1 tablet (15 mg total) by mouth daily as needed (joint pain).   metFORMIN (GLUCOPHAGE) 1000 MG tablet TAKE 1 TABLET BY MOUTH  DAILY   pravastatin (PRAVACHOL) 40 MG tablet TAKE 1 TABLET BY MOUTH  DAILY   sildenafil (VIAGRA) 100 MG tablet Take 0.5-1 tablets (50-100 mg total) by mouth daily as needed for erectile dysfunction.   SOLIQUA 100-33 UNT-MCG/ML SOPN INJECT SUBCUTANEOUSLY 60  UNITS AS DIRECTED DAILY.   traMADol (ULTRAM) 50 MG tablet Take 1 tablet (50 mg total) by mouth every 8 (eight) hours as needed.   valACYclovir (VALTREX) 1000 MG tablet Take 1 tablet (1,000 mg total) by mouth 3 (three) times daily.   No facility-administered medications prior to visit.    Review of Systems  Constitutional:  Negative for appetite change, chills and fever.  Respiratory:  Negative for chest tightness and wheezing.   Gastrointestinal:  Negative for abdominal pain, nausea and vomiting.      Objective    BP 125/79 (BP Location: Left Arm, Patient Position: Sitting, Cuff Size: Normal)   Pulse 85   Temp 98.2 F (36.8 C) (Temporal)   Resp 16   Wt 182 lb (82.6 kg)   BMI 30.29 kg/m     Physical Exam   General: Appearance:    Mildly obese male in no acute distress  Eyes:    PERRL, conjunctiva/corneas clear, EOM's intact       Lungs:     Clear to auscultation bilaterally, respirations unlabored  Heart:    Normal heart rate. Normal rhythm. No murmurs, rubs, or gallops.    MS:   Above knee  amputation of right lower extremity is noted.    Neurologic:   Awake, alert, oriented x 3. No apparent focal neurological           defect.         Assessment & Plan     1. Prostate cancer screening  - PSA Total (Reflex To Free) ([facility] only)  2. Primary hypertension Well controlled.  Continue current medications.   - Comprehensive metabolic panel  3. Diabetic nephropathy associated with type 2 diabetes mellitus ([place]) Doing well on current medication regiment.  - Hemoglobin A1c - Lipid panel  4. Strain of latissimus dorsi muscle, initial encounter refill- meloxicam (MOBIC) 15 MG tablet;  Take 1 tablet (15 mg total) by mouth daily as needed (joint pain).  Dispense: 30 tablet; Refill: 5   Future Appointments  Date Time Provider [place]  02/28/2021  9:40 AM Caryn Section, Kirstie Peri, MD [facility]         The entirety of the information documented in the History of Present Illness, Review of Systems and Physical Exam were personally obtained by me. Portions of this information were initially documented by the CMA and reviewed by me for thoroughness and accuracy.     Lelon Huh, MD  [facility] [phone] (phone) [phone] (fax)  [place]

## 2020-10-28 ENCOUNTER — Telehealth: Payer: Self-pay

## 2020-10-28 LAB — COMPREHENSIVE METABOLIC PANEL
ALT: 26 IU/L (ref 0–44)
AST: 17 IU/L (ref 0–40)
Albumin/Globulin Ratio: 2.4 — ABNORMAL HIGH (ref 1.2–2.2)
Albumin: 4.8 g/dL (ref 3.8–4.9)
Alkaline Phosphatase: 100 IU/L (ref 44–121)
BUN/Creatinine Ratio: 12 (ref 9–20)
BUN: 15 mg/dL (ref 6–24)
Bilirubin Total: 0.3 mg/dL (ref 0.0–1.2)
CO2: 22 mmol/L (ref 20–29)
Calcium: 9.9 mg/dL (ref 8.7–10.2)
Chloride: 101 mmol/L (ref 96–106)
Creatinine, Ser: 1.28 mg/dL — ABNORMAL HIGH (ref 0.76–1.27)
Globulin, Total: 2 g/dL (ref 1.5–4.5)
Glucose: 107 mg/dL — ABNORMAL HIGH (ref 65–99)
Potassium: 4.3 mmol/L (ref 3.5–5.2)
Sodium: 139 mmol/L (ref 134–144)
Total Protein: 6.8 g/dL (ref 6.0–8.5)
eGFR: 65 mL/min/{1.73_m2} (ref >59)

## 2020-10-28 LAB — HEMOGLOBIN A1C
Est. average glucose Bld gHb Est-mCnc: 146 mg/dL
Hgb A1c MFr Bld: 6.7 % — ABNORMAL HIGH (ref 4.8–5.6)

## 2020-10-28 LAB — LIPID PANEL
Chol/HDL Ratio: 2.4 ratio (ref 0.0–5.0)
Cholesterol, Total: 129 mg/dL (ref 100–199)
HDL: 53 mg/dL (ref >39)
LDL Chol Calc (NIH): 64 mg/dL (ref 0–99)
Triglycerides: 57 mg/dL (ref 0–149)
VLDL Cholesterol Cal: 12 mg/dL (ref 5–40)

## 2020-10-28 LAB — PSA TOTAL (REFLEX TO FREE): Prostate Specific Ag, Serum: 0.4 ng/mL (ref 0.0–4.0)

## 2020-10-28 NOTE — Telephone Encounter (Signed)
-----   Message from Birdie Sons, MD sent at 10/28/2020  8:57 AM EDT ----- Cholesterol is good at 129. A1c is good at 6.7. Continue current medications.  Follow up in October as scheduled.

## 2020-10-28 NOTE — Telephone Encounter (Signed)
Pt advised.   Thanks,   -Sheleen Conchas  

## 2020-11-09 ENCOUNTER — Other Ambulatory Visit: Payer: Self-pay | Admitting: Family Medicine

## 2020-11-09 DIAGNOSIS — E7800 Pure hypercholesterolemia, unspecified: Secondary | ICD-10-CM

## 2020-11-16 ENCOUNTER — Other Ambulatory Visit: Payer: Self-pay | Admitting: Family Medicine

## 2020-11-16 DIAGNOSIS — Z1211 Encounter for screening for malignant neoplasm of colon: Secondary | ICD-10-CM

## 2020-12-17 LAB — COLOGUARD: Cologuard: NEGATIVE

## 2020-12-18 NOTE — Progress Notes (Deleted)
Cologuard is negative.  Repeat in 3 years

## 2021-01-06 ENCOUNTER — Other Ambulatory Visit: Payer: Self-pay | Admitting: Family Medicine

## 2021-01-06 DIAGNOSIS — E1165 Type 2 diabetes mellitus with hyperglycemia: Secondary | ICD-10-CM

## 2021-01-06 DIAGNOSIS — IMO0002 Reserved for concepts with insufficient information to code with codable children: Secondary | ICD-10-CM

## 2021-01-06 NOTE — Telephone Encounter (Signed)
Requested Prescriptions  Pending Prescriptions Disp Refills  . SOLIQUA 100-33 UNT-MCG/ML SOPN [Pharmacy Med Name: SOLIQUA  33ML SOLUTION  100 X 5 X 3 X] 45 mL 0    Sig: INJECT SUBCUTANEOUSLY 60  UNITS AS DIRECTED DAILY.     Endocrinology: Diabetes - Insulin + GLP-1 Receptor Agonist Combos 2 Failed - 01/06/2021 12:29 AM      Failed - Cr in normal range and within 360 days    Creatinine, Ser  Date Value Ref Range Status  10/27/2020 1.28 (H) 0.76 - 1.27 mg/dL Final         Failed - AA eGFR in normal range and within 360 days    GFR calc Af Amer  Date Value Ref Range Status  10/23/2019 62 >59 mL/min/1.73 Final    Comment:    **[facility] currently reports eGFR in compliance with the current**   recommendations of the [employer]. [facility] will   update reporting as new guidelines are published from the NKF-ASN   Task force.    GFR calc non Af Amer  Date Value Ref Range Status  10/23/2019 54 (L) >59 mL/min/1.73 Final   eGFR  Date Value Ref Range Status  10/27/2020 65 >59 mL/min/1.73 Final         Passed - HBA1C is between 0 and 7.9 and within 180 days    Hgb A1c MFr Bld  Date Value Ref Range Status  10/27/2020 6.7 (H) 4.8 - 5.6 % Final    Comment:             Prediabetes: 5.7 - 6.4          Diabetes: >6.4          Glycemic control for adults with diabetes: <7.0          Passed - Valid encounter within last 6 months    Recent Outpatient Visits          2 months ago Prostate cancer screening   [facility] Birdie Sons, MD   3 months ago Chronic left shoulder pain   [facility] Birdie Sons, MD   5 months ago Type 2 diabetes mellitus with retinopathy, with long-term current use of insulin, macular edema presence unspecified, unspecified laterality, unspecified retinopathy severity ([place])   [facility] Birdie Sons, MD   10 months ago Type 2 diabetes mellitus with retinopathy, with long-term current use of  insulin, macular edema presence unspecified, unspecified laterality, unspecified retinopathy severity [facility])   [facility] Birdie Sons, MD   10 months ago Bilateral carpal tunnel syndrome   [facility] Birdie Sons, MD      Future Appointments            In 1 month Fisher, Kirstie Peri, MD [facility], [facility]

## 2021-01-17 ENCOUNTER — Other Ambulatory Visit: Payer: Self-pay | Admitting: Family Medicine

## 2021-01-17 DIAGNOSIS — F5101 Primary insomnia: Secondary | ICD-10-CM

## 2021-01-21 ENCOUNTER — Telehealth: Payer: Self-pay

## 2021-01-21 ENCOUNTER — Other Ambulatory Visit: Payer: Self-pay

## 2021-01-21 ENCOUNTER — Telehealth (INDEPENDENT_AMBULATORY_CARE_PROVIDER_SITE_OTHER): Payer: Managed Care, Other (non HMO) | Admitting: Family Medicine

## 2021-01-21 ENCOUNTER — Encounter: Payer: Self-pay | Admitting: Family Medicine

## 2021-01-21 DIAGNOSIS — R059 Cough, unspecified: Secondary | ICD-10-CM | POA: Diagnosis not present

## 2021-01-21 DIAGNOSIS — J029 Acute pharyngitis, unspecified: Secondary | ICD-10-CM

## 2021-01-21 DIAGNOSIS — Z794 Long term (current) use of insulin: Secondary | ICD-10-CM | POA: Diagnosis not present

## 2021-01-21 DIAGNOSIS — E11319 Type 2 diabetes mellitus with unspecified diabetic retinopathy without macular edema: Secondary | ICD-10-CM | POA: Diagnosis not present

## 2021-01-21 NOTE — Telephone Encounter (Signed)
Appointment scheduled today at 2pm for video visit with [facility], PA-C.

## 2021-01-21 NOTE — Telephone Encounter (Signed)
Copied from [place] [phone]. Topic: General - Other >> Jan 21, 2021  9:14 AM Leward Quan A wrote: Reason for CRM: Patient called in asking Dr Caryn Section to please call him in something for cough. Say that he have been coughing about 5 days taking OTC medication but not getting him any better. States that the cough ease for a little while then it wakes him from his sleep doesn't quiet down. Please advise Ph# [phone]
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
## 2021-01-23 ENCOUNTER — Other Ambulatory Visit: Payer: Self-pay | Admitting: Family Medicine

## 2021-01-23 DIAGNOSIS — E1165 Type 2 diabetes mellitus with hyperglycemia: Secondary | ICD-10-CM

## 2021-01-23 DIAGNOSIS — IMO0002 Reserved for concepts with insufficient information to code with codable children: Secondary | ICD-10-CM

## 2021-01-28 ENCOUNTER — Encounter: Payer: Self-pay | Admitting: Family Medicine

## 2021-01-28 ENCOUNTER — Ambulatory Visit: Payer: Managed Care, Other (non HMO) | Admitting: Family Medicine

## 2021-01-28 ENCOUNTER — Other Ambulatory Visit: Payer: Self-pay

## 2021-01-28 VITALS — BP 140/62 | HR 80 | Temp 98.3°F | Ht 62.0 in | Wt 180.4 lb

## 2021-01-28 DIAGNOSIS — R059 Cough, unspecified: Secondary | ICD-10-CM | POA: Insufficient documentation

## 2021-01-28 DIAGNOSIS — J029 Acute pharyngitis, unspecified: Secondary | ICD-10-CM | POA: Insufficient documentation

## 2021-01-28 DIAGNOSIS — I10 Essential (primary) hypertension: Secondary | ICD-10-CM

## 2021-01-28 DIAGNOSIS — G479 Sleep disorder, unspecified: Secondary | ICD-10-CM | POA: Insufficient documentation

## 2021-01-28 MED ORDER — BENZONATATE 200 MG PO CAPS: 200.0000 mg | ORAL_CAPSULE | Freq: Two times a day (BID) | ORAL | 0 refills | Status: DC | PRN

## 2021-01-28 MED ORDER — GUAIFENESIN-CODEINE 100-10 MG/5ML PO SOLN
10.0000 mL | Freq: Three times a day (TID) | ORAL | 0 refills | Status: DC | PRN
Start: 2021-01-28 — End: 2021-04-29

## 2021-01-28 MED ORDER — OMEPRAZOLE 20 MG PO CPDR: 20.0000 mg | DELAYED_RELEASE_CAPSULE | Freq: Every day | ORAL | 0 refills | Status: DC

## 2021-01-28 MED ORDER — FLUTICASONE PROPIONATE 50 MCG/ACT NA SUSP: 2.0000 | Freq: Every day | NASAL | 6 refills | Status: AC

## 2021-01-28 NOTE — Progress Notes (Signed)
Established patient visit   Patient: Robert [name]   DOB: September 02, 1960   60 y.o. Male  MRN: [id number] Visit Date: 01/28/2021  Today's healthcare provider: Gwyneth Sprout, FNP   Chief Complaint  Patient presents with   Cough   Subjective    HPI  Cough has been on and off for a few weeks now. Dry cough no phlem. Has taken different OTC with mild relief of cough.   Nyquil; Robitussin; Cough drops; etc   Medications: Outpatient Medications Prior to Visit  Medication Sig   aspirin EC 81 MG tablet Take 81 mg by mouth daily.   B-D UF III MINI PEN NEEDLES 31G X 5 MM MISC USE 4 TIMES DAILY   baclofen (LIORESAL) 10 MG tablet Take 1 tablet (10 mg total) by mouth 3 (three) times daily.   Blood Glucose Monitoring Suppl (ACCU-CHEK AVIVA PLUS) w/Device KIT Use to check blood sugar three times daily for insulin dependent diabetes (E11.9)   diltiazem (TIAZAC) 180 MG 24 hr capsule Take 1 capsule (180 mg total) by mouth daily.   glucose blood test strip Use as instructed to check sugar three times daily for insulin dependent diabets.   hydrOXYzine (ATARAX/VISTARIL) 25 MG tablet TAKE 1 TABLET (25 MG TOTAL) BY MOUTH AT BEDTIME AS NEEDED (INSOMNIA).   JARDIANCE 10 MG TABS tablet TAKE 1 TABLET BY MOUTH  DAILY   Lancets (ONETOUCH ULTRASOFT) lancets Use as instructed   lidocaine (LIDODERM) 5 % Place 1 patch onto the skin daily. Remove & Discard patch within 12 hours or as directed by MD   losartan-hydrochlorothiazide (HYZAAR) 50-12.5 MG tablet Take 1 tablet by mouth daily. TAKE IN PLACE OF LOSARTAN 50MG   meloxicam (MOBIC) 15 MG tablet Take 1 tablet (15 mg total) by mouth daily as needed (joint pain).   metFORMIN (GLUCOPHAGE) 1000 MG tablet TAKE 1 TABLET BY MOUTH  DAILY   pravastatin (PRAVACHOL) 40 MG tablet TAKE 1 TABLET BY MOUTH  DAILY   sildenafil (VIAGRA) 100 MG tablet Take 0.5-1 tablets (50-100 mg total) by mouth daily as needed for erectile dysfunction.   SOLIQUA 100-33 UNT-MCG/ML SOPN INJECT  SUBCUTANEOUSLY 60  UNITS AS DIRECTED DAILY.   traMADol (ULTRAM) 50 MG tablet Take 1 tablet (50 mg total) by mouth every 8 (eight) hours as needed.   valACYclovir (VALTREX) 1000 MG tablet Take 1 tablet (1,000 mg total) by mouth 3 (three) times daily.   No facility-administered medications prior to visit.    Review of Systems  Last CBC Lab Results  Component Value Date   WBC 4.5 09/24/2020   HGB 14.4 09/24/2020   HCT 43.0 09/24/2020   MCV 81 09/24/2020   MCH 27.3 09/24/2020   RDW 13.2 09/24/2020   PLT 317 88/03/314   Last metabolic panel Lab Results  Component Value Date   GLUCOSE 107 (H) 10/27/2020   NA 139 10/27/2020   K 4.3 10/27/2020   CL 101 10/27/2020   CO2 22 10/27/2020   BUN 15 10/27/2020   CREATININE 1.28 (H) 10/27/2020   GFRNONAA 54 (L) 10/23/2019   GFRAA 62 10/23/2019   CALCIUM 9.9 10/27/2020   PHOS 3.3 11/23/2016   PROT 6.8 10/27/2020   ALBUMIN 4.8 10/27/2020   LABGLOB 2.0 10/27/2020   AGRATIO 2.4 (H) 10/27/2020   BILITOT 0.3 10/27/2020   ALKPHOS 100 10/27/2020   AST 17 10/27/2020   ALT 26 10/27/2020   ANIONGAP 12 07/30/2015   Last lipids Lab Results  Component Value  Date   CHOL 129 10/27/2020   HDL 53 10/27/2020   LDLCALC 64 10/27/2020   TRIG 57 10/27/2020   CHOLHDL 2.4 10/27/2020   Last hemoglobin A1c Lab Results  Component Value Date   HGBA1C 6.7 (H) 10/27/2020     Objective    BP 140/62 (BP Location: Right Arm, Patient Position: Sitting, Cuff Size: Large)   Pulse 80   Temp 98.3 F (36.8 C) (Oral)   Ht '5\' 2"'  (1.575 m)   Wt 180 lb 6.4 oz (81.8 kg)   SpO2 95%   BMI 33.00 kg/m  BP Readings from Last 3 Encounters:  01/28/21 140/62  10/27/20 125/79  09/24/20 136/72   Wt Readings from Last 3 Encounters:  01/28/21 180 lb 6.4 oz (81.8 kg)  10/27/20 182 lb (82.6 kg)  09/24/20 182 lb (82.6 kg)      Physical Exam Nursing note reviewed.  Constitutional:      General: He is not in acute distress.    Appearance: Normal appearance.  He is obese. He is not ill-appearing, toxic-appearing or diaphoretic.  HENT:     Head: Normocephalic and atraumatic.     Right Ear: Hearing, tympanic membrane, ear canal and external ear normal.     Left Ear: Hearing, tympanic membrane, ear canal and external ear normal.     Nose: Nose normal.     Right Turbinates: Not enlarged, swollen or pale.     Left Turbinates: Not enlarged, swollen or pale.     Right Sinus: No maxillary sinus tenderness or frontal sinus tenderness.     Left Sinus: No maxillary sinus tenderness or frontal sinus tenderness.     Mouth/Throat:     Lips: Pink.     Mouth: Mucous membranes are moist.     Pharynx: Oropharynx is clear.  Eyes:     Pupils: Pupils are equal, round, and reactive to light.  Cardiovascular:     Rate and Rhythm: Normal rate and regular rhythm.     Pulses: Normal pulses.     Heart sounds: Normal heart sounds.  Pulmonary:     Effort: Pulmonary effort is normal. No respiratory distress.     Breath sounds: Normal breath sounds. No stridor. No wheezing, rhonchi or rales.  Chest:     Chest wall: No tenderness.  Musculoskeletal:        General: Normal range of motion.     Cervical back: Normal range of motion.  Lymphadenopathy:     Head:     Right side of head: No submental, submandibular, tonsillar, preauricular or posterior auricular adenopathy.     Left side of head: No submental, submandibular, tonsillar, preauricular or posterior auricular adenopathy.     Cervical: No cervical adenopathy.     Right cervical: No superficial, deep or posterior cervical adenopathy.    Left cervical: No superficial, deep or posterior cervical adenopathy.  Skin:    General: Skin is warm and dry.     Capillary Refill: Capillary refill takes less than 2 seconds.  Neurological:     General: No focal deficit present.     Mental Status: He is alert and oriented to person, place, and time. Mental status is at baseline.  Psychiatric:        Mood and Affect: Mood  normal.        Behavior: Behavior normal.        Thought Content: Thought content normal.        Judgment: Judgment normal.  No results found for any visits on 01/28/21.  Assessment & Plan     Problem List Items Addressed This Visit       Cardiovascular and Mediastinum   Hypertension    Chronic, stable Denies CP, SOB Endorses Dry cough        Other   Sleep disturbance    D/t cough Add cough medication with codeine for sleep       Sore throat    Intermittent Not irritated on exam Recommend frequent fluids, non irritating food/drink (avoid extreme hot/cold, acidic, etc) Continue OTC medication- drops/lozenges etc      Cough - Primary    Long lasting cough Denies allergies; however, reports cough at similar time per year Recommend flonase use as well as prn cough syrup to promote rest Trial of PPI as well given concern given obesity and long lasting cough      Relevant Medications   guaiFENesin-codeine 100-10 MG/5ML syrup   fluticasone (FLONASE) 50 MCG/ACT nasal spray   benzonatate (TESSALON) 200 MG capsule   omeprazole (PRILOSEC) 20 MG capsule     Return if symptoms worsen or fail to improve.     Vonna Kotyk, FNP, have reviewed all documentation for this visit. The documentation on 01/28/21 for the exam, diagnosis, procedures, and orders are all accurate and complete.    Gwyneth Sprout, [place] [phone] (phone) [phone] (fax)  [place]

## 2021-01-28 NOTE — Assessment & Plan Note (Signed)
Long lasting cough Denies allergies; however, reports cough at similar time per year Recommend flonase use as well as prn cough syrup to promote rest Trial of PPI as well given concern given obesity and long lasting cough

## 2021-01-28 NOTE — Assessment & Plan Note (Signed)
D/t cough Add cough medication with codeine for sleep

## 2021-01-28 NOTE — Assessment & Plan Note (Signed)
Intermittent Not irritated on exam Recommend frequent fluids, non irritating food/drink (avoid extreme hot/cold, acidic, etc) Continue OTC medication- drops/lozenges etc

## 2021-01-28 NOTE — Assessment & Plan Note (Signed)
Chronic, stable Denies CP, SOB Endorses Dry cough

## 2021-02-03 ENCOUNTER — Other Ambulatory Visit: Payer: Self-pay | Admitting: Family Medicine

## 2021-02-10 ENCOUNTER — Other Ambulatory Visit: Payer: Self-pay | Admitting: Family Medicine

## 2021-02-10 DIAGNOSIS — F5101 Primary insomnia: Secondary | ICD-10-CM

## 2021-02-28 ENCOUNTER — Ambulatory Visit: Payer: Managed Care, Other (non HMO) | Admitting: Family Medicine

## 2021-02-28 NOTE — Progress Notes (Deleted)
Established patient visit   Patient: Robert [name]   DOB: 10-31-1960   60 y.o. Male  MRN: [id number] Visit Date: 02/28/2021  Today's healthcare provider: Lelon Huh, MD   No chief complaint on file.  Subjective    HPI  Diabetes Mellitus Type II, follow-up  Lab Results  Component Value Date   HGBA1C 6.7 (H) 10/27/2020   HGBA1C 6.9 (A) 07/20/2020   HGBA1C 7.9 (A) 03/12/2020   Last seen for diabetes 4 months ago.  Management since then includes continuing the same treatment. He reports {excellent/good/fair/poor:19665} compliance with treatment. He {is/is not:21021397} having side effects. {document side effects if present:1}  Home blood sugar records: {diabetes glucometry results:16657}  Episodes of hypoglycemia? {Yes/No:20286} {enter details if yes:1}   Current insulin regiment: none Most Recent Eye Exam: 02/29/2020  --------------------------------------------------------------------------------------------------- Hypertension, follow-up  BP Readings from Last 3 Encounters:  01/28/21 140/62  10/27/20 125/79  09/24/20 136/72   Wt Readings from Last 3 Encounters:  01/28/21 180 lb 6.4 oz (81.8 kg)  10/27/20 182 lb (82.6 kg)  09/24/20 182 lb (82.6 kg)     He was last seen for hypertension 4 months ago.  BP at that visit was 125/79. Management since that visit includes no changes. He reports {excellent/good/fair/poor:19665} compliance with treatment. He {is/is not:9024} having side effects. {document side effects if present:1} He {is/is not:9024} exercising. He {is/is not:9024} adherent to low salt diet.   Outside blood pressures are {enter patient reported home BP, or 'not being checked':1}.  He {does/does not:200015} smoke.  Use of agents associated with hypertension: {bp agents assoc with hypertension:511::"none"}.   --------------------------------------------------------------------------------------------------- Lipid/Cholesterol,  follow-up  Last Lipid Panel: Lab Results  Component Value Date   CHOL 129 10/27/2020   LDLCALC 64 10/27/2020   HDL 53 10/27/2020   TRIG 57 10/27/2020    He was last seen for this 4 months ago.  Management since that visit includes no changes.  He reports {excellent/good/fair/poor:19665} compliance with treatment. He {is/is not:9024} having side effects. {document side effects if present:1}  Symptoms: {Yes/No:20286} appetite changes {Yes/No:20286} foot ulcerations  {Yes/No:20286} chest pain {Yes/No:20286} chest pressure/discomfort  {Yes/No:20286} dyspnea {Yes/No:20286} orthopnea  {Yes/No:20286} fatigue {Yes/No:20286} lower extremity edema  {Yes/No:20286} palpitations {Yes/No:20286} paroxysmal nocturnal dyspnea  {Yes/No:20286} nausea {Yes/No:20286} numbness or tingling of extremity  {Yes/No:20286} polydipsia {Yes/No:20286} polyuria  {Yes/No:20286} speech difficulty {Yes/No:20286} syncope   He is following a {diet:21022986} diet. Current exercise: {exercise AXENM:07680}  Last metabolic panel Lab Results  Component Value Date   GLUCOSE 107 (H) 10/27/2020   NA 139 10/27/2020   K 4.3 10/27/2020   BUN 15 10/27/2020   CREATININE 1.28 (H) 10/27/2020   EGFR 65 10/27/2020   GFRNONAA 54 (L) 10/23/2019   CALCIUM 9.9 10/27/2020   AST 17 10/27/2020   ALT 26 10/27/2020   The ASCVD Risk score (Arnett DK, et al., 2019) failed to calculate for the following reasons:   The valid total cholesterol range is 130 to 320 mg/dL  ---------------------------------------------------------------------------------------------------     Medications: Outpatient Medications Prior to Visit  Medication Sig   aspirin EC 81 MG tablet Take 81 mg by mouth daily.   B-D UF III MINI PEN NEEDLES 31G X 5 MM MISC USE 4 TIMES DAILY   baclofen (LIORESAL) 10 MG tablet Take 1 tablet (10 mg total) by mouth 3 (three) times daily.   benzonatate (TESSALON) 200 MG capsule Take 1 capsule (200 mg total) by mouth 2  (two) times daily as needed for cough.  Blood Glucose Monitoring Suppl (ACCU-CHEK AVIVA PLUS) w/Device KIT Use to check blood sugar three times daily for insulin dependent diabetes (E11.9)   diltiazem (TIAZAC) 180 MG 24 hr capsule TAKE 1 CAPSULE BY MOUTH  DAILY   fluticasone (FLONASE) 50 MCG/ACT nasal spray Place 2 sprays into both nostrils daily.   glucose blood test strip Use as instructed to check sugar three times daily for insulin dependent diabets.   guaiFENesin-codeine 100-10 MG/5ML syrup Take 10 mLs by mouth 3 (three) times daily as needed for cough.   hydrOXYzine (ATARAX/VISTARIL) 25 MG tablet TAKE 1 TABLET (25 MG TOTAL) BY MOUTH AT BEDTIME AS NEEDED (INSOMNIA).   JARDIANCE 10 MG TABS tablet TAKE 1 TABLET BY MOUTH  DAILY   Lancets (ONETOUCH ULTRASOFT) lancets Use as instructed   lidocaine (LIDODERM) 5 % Place 1 patch onto the skin daily. Remove & Discard patch within 12 hours or as directed by MD   losartan-hydrochlorothiazide (HYZAAR) 50-12.5 MG tablet Take 1 tablet by mouth daily. TAKE IN PLACE OF LOSARTAN 50MG   meloxicam (MOBIC) 15 MG tablet Take 1 tablet (15 mg total) by mouth daily as needed (joint pain).   metFORMIN (GLUCOPHAGE) 1000 MG tablet TAKE 1 TABLET BY MOUTH  DAILY   omeprazole (PRILOSEC) 20 MG capsule Take 1 capsule (20 mg total) by mouth daily.   pravastatin (PRAVACHOL) 40 MG tablet TAKE 1 TABLET BY MOUTH  DAILY   sildenafil (VIAGRA) 100 MG tablet Take 0.5-1 tablets (50-100 mg total) by mouth daily as needed for erectile dysfunction.   SOLIQUA 100-33 UNT-MCG/ML SOPN INJECT SUBCUTANEOUSLY 60  UNITS AS DIRECTED DAILY.   traMADol (ULTRAM) 50 MG tablet Take 1 tablet (50 mg total) by mouth every 8 (eight) hours as needed.   valACYclovir (VALTREX) 1000 MG tablet Take 1 tablet (1,000 mg total) by mouth 3 (three) times daily.   No facility-administered medications prior to visit.    Review of Systems     Objective    There were no vitals taken for this  visit.   Physical Exam  ***  No results found for any visits on 02/28/21.  Assessment & Plan     ***  No follow-ups on file.      {provider attestation***:1}   Lelon Huh, MD  [facility] [phone] (phone) [phone] (fax)  [place]

## 2021-03-04 ENCOUNTER — Other Ambulatory Visit: Payer: Self-pay | Admitting: Family Medicine

## 2021-03-05 NOTE — Telephone Encounter (Signed)
Requested medication (s) are due for refill today: -  Requested medication (s) are on the active medication list: no  Last refill:  04/06/20 #180 3 RF  Future visit scheduled: yes  Notes to clinic:  prescription was dc'd on 10/27/20 "completed course"   Requested Prescriptions  Pending Prescriptions Disp Refills   ibuprofen (ADVIL) 800 MG tablet [Pharmacy Med Name: Ibuprofen 800 MG Oral Tablet] 180 tablet 3    Sig: TAKE 1 TABLET BY MOUTH  TWICE DAILY AS NEEDED     Analgesics:  NSAIDS Failed - 03/04/2021 10:56 PM      Failed - Cr in normal range and within 360 days    Creatinine, Ser  Date Value Ref Range Status  10/27/2020 1.28 (H) 0.76 - 1.27 mg/dL Final          Passed - HGB in normal range and within 360 days    Hemoglobin  Date Value Ref Range Status  09/24/2020 14.4 13.0 - 17.7 g/dL Final          Passed - Patient is not pregnant      Passed - Valid encounter within last 12 months    Recent Outpatient Visits           1 month ago Cough   [facility] Gwyneth Sprout, FNP   1 month ago Cough   [facility], Vickki Muff, PA-C   4 months ago Prostate cancer screening   [facility] Birdie Sons, MD   5 months ago Chronic left shoulder pain   [facility] Birdie Sons, MD   7 months ago Type 2 diabetes mellitus with retinopathy, with long-term current use of insulin, macular edema presence unspecified, unspecified laterality, unspecified retinopathy severity [facility])   [place], Kirstie Peri, MD       Future Appointments             In 1 month [place], Kirstie Peri, MD [facility], [facility]

## 2021-03-16 ENCOUNTER — Telehealth: Payer: Self-pay | Admitting: Family Medicine

## 2021-03-16 DIAGNOSIS — Z794 Long term (current) use of insulin: Secondary | ICD-10-CM

## 2021-03-16 MED ORDER — SOLIQUA 100-33 UNT-MCG/ML ~~LOC~~ SOPN: PEN_INJECTOR | SUBCUTANEOUS | 0 refills | Status: DC

## 2021-03-16 NOTE — Addendum Note (Signed)
Addended by: Randal Buba on: 03/16/2021 03:20 PM   Modules accepted: Orders

## 2021-03-16 NOTE — Telephone Encounter (Signed)
[facility] faxed refill request for the following medications:   SOLIQUA 100-33 UNT-MCG/ML SOPN   Please advise.

## 2021-03-30 LAB — HM DIABETES EYE EXAM

## 2021-04-14 ENCOUNTER — Other Ambulatory Visit: Payer: Self-pay | Admitting: Family Medicine

## 2021-04-14 DIAGNOSIS — E7800 Pure hypercholesterolemia, unspecified: Secondary | ICD-10-CM

## 2021-04-29 ENCOUNTER — Other Ambulatory Visit: Payer: Self-pay

## 2021-04-29 ENCOUNTER — Encounter: Payer: Self-pay | Admitting: Family Medicine

## 2021-04-29 ENCOUNTER — Ambulatory Visit: Payer: Managed Care, Other (non HMO) | Admitting: Family Medicine

## 2021-04-29 VITALS — BP 103/66 | HR 97 | Temp 98.5°F | Wt 175.0 lb

## 2021-04-29 DIAGNOSIS — E1165 Type 2 diabetes mellitus with hyperglycemia: Secondary | ICD-10-CM

## 2021-04-29 DIAGNOSIS — Z794 Long term (current) use of insulin: Secondary | ICD-10-CM | POA: Diagnosis not present

## 2021-04-29 DIAGNOSIS — R059 Cough, unspecified: Secondary | ICD-10-CM

## 2021-04-29 DIAGNOSIS — Z23 Encounter for immunization: Secondary | ICD-10-CM

## 2021-04-29 DIAGNOSIS — N529 Male erectile dysfunction, unspecified: Secondary | ICD-10-CM

## 2021-04-29 LAB — POCT GLYCOSYLATED HEMOGLOBIN (HGB A1C): Hemoglobin A1C: 5.9 % — AB (ref 4.0–5.6)

## 2021-04-29 MED ORDER — BENZONATATE 200 MG PO CAPS: 200.0000 mg | ORAL_CAPSULE | Freq: Two times a day (BID) | ORAL | 0 refills | Status: DC | PRN

## 2021-04-29 MED ORDER — TADALAFIL 20 MG PO TABS: 10.0000 mg | ORAL_TABLET | ORAL | 11 refills | Status: DC | PRN

## 2021-04-29 NOTE — Patient Instructions (Signed)
.   Please review the attached list of medications and notify my office if there are any errors.   . Please bring all of your medications to every appointment so we can make sure that our medication list is the same as yours.   

## 2021-04-29 NOTE — Progress Notes (Signed)
Established Patient Office Visit  Subjective:  Patient ID: Robert [name], male    DOB: 04/30/1961  Age: 60 y.o. MRN: [id number] Hypertension, follow-up  BP Readings from Last 3 Encounters:  04/29/21 103/66  01/28/21 140/62  10/27/20 125/79   Wt Readings from Last 3 Encounters:  04/29/21 175 lb (79.4 kg)  01/28/21 180 lb 6.4 oz (81.8 kg)  10/27/20 182 lb (82.6 kg)     He was last seen for hypertension 6 months ago.  BP at that visit was 125/79. Management since that visit includes  Continue current medications.  He reports excellent compliance with treatment. He is not having side effects.  He is following a Regular diet. He is not exercising. He does not smoke.  Use of agents associated with hypertension: none.   Outside blood pressures are not being checked at home.   Pertinent labs: Lab Results  Component Value Date   CHOL 129 10/27/2020   HDL 53 10/27/2020   LDLCALC 64 10/27/2020   TRIG 57 10/27/2020   CHOLHDL 2.4 10/27/2020   Lab Results  Component Value Date   NA 139 10/27/2020   K 4.3 10/27/2020   CREATININE 1.28 (H) 10/27/2020   EGFR 65 10/27/2020   GLUCOSE 107 (H) 10/27/2020     The ASCVD Risk score (Arnett DK, et al., 2019) failed to calculate for the following reasons:   The valid total cholesterol range is 130 to 320 mg/dL   ---------------------------------------------------------------------------------------------------   Diabetes Mellitus Type II, Follow-up  Lab Results  Component Value Date   HGBA1C 6.7 (H) 10/27/2020   HGBA1C 6.9 (A) 07/20/2020   HGBA1C 7.9 (A) 03/12/2020   Wt Readings from Last 3 Encounters:  04/29/21 175 lb (79.4 kg)  01/28/21 180 lb 6.4 oz (81.8 kg)  10/27/20 182 lb (82.6 kg)   Last seen for diabetes 6 months ago.  Management since then includes  Continue current medications. He reports excellent compliance with treatment. He is not having side effects.  Symptoms: No fatigue No foot ulcerations  No appetite  changes No nausea  No paresthesia of the feet  No polydipsia  No polyuria No visual disturbances   No vomiting     Home blood sugar records:  are not being checked  Episodes of hypoglycemia? No    Current insulin regiment:  Most Recent Eye Exam: within the last year.    ---------------------------------------------------------------------------------------------------   Outpatient Medications Prior to Visit  Medication Sig   aspirin EC 81 MG tablet Take 81 mg by mouth daily.   B-D UF III MINI PEN NEEDLES 31G X 5 MM MISC USE 4 TIMES DAILY   baclofen (LIORESAL) 10 MG tablet Take 1 tablet (10 mg total) by mouth 3 (three) times daily.   Blood Glucose Monitoring Suppl (ACCU-CHEK AVIVA PLUS) w/Device KIT Use to check blood sugar three times daily for insulin dependent diabetes (E11.9)   diltiazem (TIAZAC) 180 MG 24 hr capsule TAKE 1 CAPSULE BY MOUTH  DAILY   fluticasone (FLONASE) 50 MCG/ACT nasal spray Place 2 sprays into both nostrils daily.   glucose blood test strip Use as instructed to check sugar three times daily for insulin dependent diabets.   hydrOXYzine (ATARAX/VISTARIL) 25 MG tablet TAKE 1 TABLET (25 MG TOTAL) BY MOUTH AT BEDTIME AS NEEDED (INSOMNIA).   Insulin Glargine-Lixisenatide (SOLIQUA) 100-33 UNT-MCG/ML SOPN INJECT SUBCUTANEOUSLY 60  UNITS AS DIRECTED DAILY.   JARDIANCE 10 MG TABS tablet TAKE 1 TABLET BY MOUTH  DAILY   Lancets (ONETOUCH ULTRASOFT)  lancets Use as instructed   lidocaine (LIDODERM) 5 % Place 1 patch onto the skin daily. Remove & Discard patch within 12 hours or as directed by MD   losartan-hydrochlorothiazide (HYZAAR) 50-12.5 MG tablet Take 1 tablet by mouth daily. TAKE IN PLACE OF LOSARTAN 50MG   meloxicam (MOBIC) 15 MG tablet Take 1 tablet (15 mg total) by mouth daily as needed (joint pain).   metFORMIN (GLUCOPHAGE) 1000 MG tablet TAKE 1 TABLET BY MOUTH  DAILY   omeprazole (PRILOSEC) 20 MG capsule Take 1 capsule (20 mg total) by mouth daily.   pravastatin  (PRAVACHOL) 40 MG tablet TAKE 1 TABLET BY MOUTH  DAILY   sildenafil (VIAGRA) 100 MG tablet Take 0.5-1 tablets (50-100 mg total) by mouth daily as needed for erectile dysfunction.   traMADol (ULTRAM) 50 MG tablet Take 1 tablet (50 mg total) by mouth every 8 (eight) hours as needed.   valACYclovir (VALTREX) 1000 MG tablet Take 1 tablet (1,000 mg total) by mouth 3 (three) times daily.   [DISCONTINUED] benzonatate (TESSALON) 200 MG capsule Take 1 capsule (200 mg total) by mouth 2 (two) times daily as needed for cough.   [DISCONTINUED] guaiFENesin-codeine 100-10 MG/5ML syrup Take 10 mLs by mouth 3 (three) times daily as needed for cough.   No facility-administered medications prior to visit.      No Known Allergies     Objective:    BP 103/66 (BP Location: Right Arm, Patient Position: Sitting, Cuff Size: Large)    Pulse 97    Temp 98.5 F (36.9 C) (Oral)    Wt 175 lb (79.4 kg)    SpO2 98%    BMI 32.01 kg/m   Physical Exam  General appearance: Mildly obese male, cooperative and in no acute distress Head: Normocephalic, without obvious abnormality, atraumatic Respiratory: Respirations even and unlabored, normal respiratory rate Extremities: Above knee amputation of right lower extremity is noted.  Skin: Skin color, texture, turgor normal. No rashes seen  Psych: Appropriate mood and affect. Neurologic: Mental status: Alert, oriented to person, place, and time, thought content appropriate.   Results for orders placed or performed in visit on 04/29/21  POCT glycosylated hemoglobin (Hb A1C)  Result Value Ref Range   Hemoglobin A1C 5.9 (A) 4.0 - 5.6 %      Assessment & Plan:   1. Type 2 diabetes mellitus with hyperglycemia, with long-term current use of insulin (HCC) Very controlled.   2. Cough Refill  benzonatate (TESSALON) 200 MG capsule; Take 1 capsule (200 mg total) by mouth 2 (two) times daily as needed for cough.  Dispense: 20 capsule; Refill: 0  3. Erectile dysfunction,  unspecified erectile dysfunction type He states sildenafil is not working as well lately. Try change to  tadalafil (CIALIS) 20 MG tablet; Take 0.5-1 tablets (10-20 mg total) by mouth every other day as needed for erectile dysfunction.  Dispense: 10 tablet; Refill: 11   4. Need for influenza vaccination  - Flu Vaccine QUAD 6+ mos PF IM (Fluarix Quad PF)  Future Appointments  Date Time Provider [place]  08/05/2021  2:40 PM Caryn Section Kirstie Peri, MD [facility]

## 2021-05-03 ENCOUNTER — Encounter: Payer: Self-pay | Admitting: Family Medicine

## 2021-05-10 ENCOUNTER — Encounter: Payer: Self-pay | Admitting: Family Medicine

## 2021-05-24 ENCOUNTER — Other Ambulatory Visit: Payer: Self-pay | Admitting: Family Medicine

## 2021-05-24 DIAGNOSIS — Z794 Long term (current) use of insulin: Secondary | ICD-10-CM

## 2021-05-24 DIAGNOSIS — E1165 Type 2 diabetes mellitus with hyperglycemia: Secondary | ICD-10-CM

## 2021-05-24 NOTE — Telephone Encounter (Signed)
Requested Prescriptions  Pending Prescriptions Disp Refills   [place] 100-33 UNT-MCG/ML SOPN [Pharmacy Med Name: Willeen Niece 100-33 UNT-MCG/ML Subcutaneous Solution Pen-injector] 45 mL 1    Sig: INJECT SUBCUTANEOUSLY 60 UNITS  AS DIRECTED DAILY     Endocrinology: Diabetes - Insulin + GLP-1 Receptor Agonist Combos 2 Failed - 05/24/2021  4:41 AM      Failed - Cr in normal range and within 360 days    Creatinine, Ser  Date Value Ref Range Status  10/27/2020 1.28 (H) 0.76 - 1.27 mg/dL Final         Failed - AA eGFR in normal range and within 360 days    GFR calc Af Amer  Date Value Ref Range Status  10/23/2019 62 >59 mL/min/1.73 Final    Comment:    **[facility] currently reports eGFR in compliance with the current**   recommendations of the [employer]. [facility] will   update reporting as new guidelines are published from the NKF-ASN   Task force.    GFR calc non Af Amer  Date Value Ref Range Status  10/23/2019 54 (L) >59 mL/min/1.73 Final   eGFR  Date Value Ref Range Status  10/27/2020 65 >59 mL/min/1.73 Final         Passed - HBA1C is between 0 and 7.9 and within 180 days    Hemoglobin A1C  Date Value Ref Range Status  04/29/2021 5.9 (A) 4.0 - 5.6 % Final    Comment:    Average 123   Hgb A1c MFr Bld  Date Value Ref Range Status  10/27/2020 6.7 (H) 4.8 - 5.6 % Final    Comment:             Prediabetes: 5.7 - 6.4          Diabetes: >6.4          Glycemic control for adults with diabetes: <7.0          Passed - Valid encounter within last 6 months    Recent Outpatient Visits          3 weeks ago Type 2 diabetes mellitus with hyperglycemia, with long-term current use of insulin [facility])   [facility] Birdie Sons, MD   3 months ago Cough   [facility] Gwyneth Sprout, FNP   4 months ago Cough   [facility], Vickki Muff, PA-C   6 months ago Prostate cancer screening   [facility] Birdie Sons, MD   8 months ago Chronic left shoulder pain   [facility] Birdie Sons, MD      Future Appointments            In 2 months Fisher, Kirstie Peri, MD [facility], [place]

## 2021-06-13 ENCOUNTER — Other Ambulatory Visit: Payer: Self-pay

## 2021-06-13 ENCOUNTER — Ambulatory Visit: Payer: Self-pay | Admitting: *Deleted

## 2021-06-13 ENCOUNTER — Ambulatory Visit: Payer: Managed Care, Other (non HMO) | Admitting: Family Medicine

## 2021-06-13 ENCOUNTER — Encounter: Payer: Self-pay | Admitting: Family Medicine

## 2021-06-13 VITALS — BP 138/72 | HR 102 | Temp 98.4°F | Wt 175.0 lb

## 2021-06-13 DIAGNOSIS — R051 Acute cough: Secondary | ICD-10-CM

## 2021-06-13 DIAGNOSIS — J069 Acute upper respiratory infection, unspecified: Secondary | ICD-10-CM

## 2021-06-13 MED ORDER — HYDROCODONE BIT-HOMATROP MBR 5-1.5 MG/5ML PO SOLN: 5.0000 mL | Freq: Three times a day (TID) | ORAL | 0 refills | Status: DC | PRN

## 2021-06-13 NOTE — Telephone Encounter (Signed)
°  Chief Complaint: cough, runny nose Symptoms: cough- mostly dry, runny nose Frequency: 3 days Pertinent Negatives: Patient denies fever, SOB Disposition: [] ED /[] Urgent Care (no appt availability in office) / [x] Appointment(In office/virtual)/ []  [place] [facility]/ [] Home Care/ [] Refused Recommended Disposition /[] [place] [facility]/ []  Follow-up with PCP Additional Notes:

## 2021-06-13 NOTE — Progress Notes (Signed)
Established patient visit   Patient: Robert [name]   DOB: 16-Aug-1960   61 y.o. Male  MRN: [id number] Visit Date: 06/13/2021  Today's healthcare provider: Lelon Huh, MD   Chief Complaint  Patient presents with   Cough   Subjective    Cough This is a new problem. Episode onset: 3 days ago. The problem has been gradually worsening. The cough is Productive of sputum (clear sputum). Associated symptoms include rhinorrhea and a sore throat. Pertinent negatives include no chest pain, chills, ear congestion, ear pain, fever, headaches, hemoptysis, myalgias, nasal congestion, postnasal drip, shortness of breath or wheezing. Treatments tried: Robitussin, Tessalon  and  [place]alls cough drops. The treatment provided no relief.   Symptoms worsen at night.   Medications: Outpatient Medications Prior to Visit  Medication Sig   aspirin EC 81 MG tablet Take 81 mg by mouth daily.   B-D UF III MINI PEN NEEDLES 31G X 5 MM MISC USE 4 TIMES DAILY   baclofen (LIORESAL) 10 MG tablet Take 1 tablet (10 mg total) by mouth 3 (three) times daily.   benzonatate (TESSALON) 200 MG capsule Take 1 capsule (200 mg total) by mouth 2 (two) times daily as needed for cough.   Blood Glucose Monitoring Suppl (ACCU-CHEK AVIVA PLUS) w/Device KIT Use to check blood sugar three times daily for insulin dependent diabetes (E11.9)   diltiazem (TIAZAC) 180 MG 24 hr capsule TAKE 1 CAPSULE BY MOUTH  DAILY   fluticasone (FLONASE) 50 MCG/ACT nasal spray Place 2 sprays into both nostrils daily.   glucose blood test strip Use as instructed to check sugar three times daily for insulin dependent diabets.   hydrOXYzine (ATARAX/VISTARIL) 25 MG tablet TAKE 1 TABLET (25 MG TOTAL) BY MOUTH AT BEDTIME AS NEEDED (INSOMNIA).   JARDIANCE 10 MG TABS tablet TAKE 1 TABLET BY MOUTH  DAILY   Lancets (ONETOUCH ULTRASOFT) lancets Use as instructed   lidocaine (LIDODERM) 5 % Place 1 patch onto the skin daily. Remove & Discard patch within 12 hours or  as directed by MD   losartan-hydrochlorothiazide (HYZAAR) 50-12.5 MG tablet Take 1 tablet by mouth daily. TAKE IN PLACE OF LOSARTAN 50MG   meloxicam (MOBIC) 15 MG tablet Take 1 tablet (15 mg total) by mouth daily as needed (joint pain).   metFORMIN (GLUCOPHAGE) 1000 MG tablet TAKE 1 TABLET BY MOUTH  DAILY   omeprazole (PRILOSEC) 20 MG capsule Take 1 capsule (20 mg total) by mouth daily.   pravastatin (PRAVACHOL) 40 MG tablet TAKE 1 TABLET BY MOUTH  DAILY   sildenafil (VIAGRA) 100 MG tablet Take 0.5-1 tablets (50-100 mg total) by mouth daily as needed for erectile dysfunction.   SOLIQUA 100-33 UNT-MCG/ML SOPN INJECT SUBCUTANEOUSLY 60 UNITS  AS DIRECTED DAILY   tadalafil (CIALIS) 20 MG tablet Take 0.5-1 tablets (10-20 mg total) by mouth every other day as needed for erectile dysfunction.   traMADol (ULTRAM) 50 MG tablet Take 1 tablet (50 mg total) by mouth every 8 (eight) hours as needed.   valACYclovir (VALTREX) 1000 MG tablet Take 1 tablet (1,000 mg total) by mouth 3 (three) times daily.   No facility-administered medications prior to visit.    Review of Systems  Constitutional:  Negative for appetite change, chills and fever.  HENT:  Positive for rhinorrhea and sore throat. Negative for ear pain and postnasal drip.   Respiratory:  Positive for cough. Negative for hemoptysis, chest tightness, shortness of breath and wheezing.   Cardiovascular:  Negative for  chest pain and palpitations.  Gastrointestinal:  Negative for abdominal pain, nausea and vomiting.  Musculoskeletal:  Negative for myalgias.  Neurological:  Negative for headaches.      Objective    BP 138/72 (BP Location: Left Arm, Patient Position: Sitting, Cuff Size: Large)    Pulse (!) 102    Temp 98.4 F (36.9 C) (Oral)    Wt 175 lb (79.4 kg)    SpO2 99% Comment: room air   BMI 32.01 kg/m  {Show previous vital signs (optional):23777}  Physical Exam  General Appearance:    Mildly obese male, alert, cooperative, in no acute  distress  HENT:   bilateral TM normal without fluid or infection, neck without nodes, throat normal without erythema or exudate, sinuses nontender, post nasal drip noted, and nasal mucosa pale and congested  Eyes:    PERRL, conjunctiva/corneas clear, EOM's intact       Lungs:     Clear to auscultation bilaterally, respirations unlabored  Heart:    Tachycardic. Normal rhythm. No murmurs, rubs, or gallops.    Neurologic:   Awake, alert, oriented x 3. No apparent focal neurological           defect.          Assessment & Plan     1. Upper respiratory tract infection, unspecified type   2. Acute cough  - HYDROcodone bit-homatropine (HYCODAN) 5-1.5 MG/5ML syrup; Take 5 mLs by mouth every 8 (eight) hours as needed for cough.  Dispense: 120 mL; Refill: 0   Call if symptoms change or if not rapidly improving.        The entirety of the information documented in the History of Present Illness, Review of Systems and Physical Exam were personally obtained by me. Portions of this information were initially documented by the CMA and reviewed by me for thoroughness and accuracy.     Lelon Huh, MD  [facility] [phone] (phone) [phone] (fax)  [place]

## 2021-06-13 NOTE — Telephone Encounter (Signed)
Summary: cough and runny nose   Pt called in stating he has a bad cough and runny nose, and stating he wanted to speak with a nurse about what all he can take, please advise.      Reason for Disposition  [1] Patient also has allergy symptoms (e.g., itchy eyes, clear nasal discharge, postnasal drip) AND [2] they are acting up  Answer Assessment - Initial Assessment Questions 1. ONSET: "When did the cough begin?"      3 days 2. SEVERITY: "How bad is the cough today?"      Dry- sometimes mucus 3. SPUTUM: "Describe the color of your sputum" (none, dry cough; clear, white, yellow, green)     No color 4. HEMOPTYSIS: "Are you coughing up any blood?" If so ask: "How much?" (flecks, streaks, tablespoons, etc.)     *No Answer* 5. DIFFICULTY BREATHING: "Are you having difficulty breathing?" If Yes, ask: "How bad is it?" (e.g., mild, moderate, severe)    - MILD: No SOB at rest, mild SOB with walking, speaks normally in sentences, can lie down, no retractions, pulse < 100.    - MODERATE: SOB at rest, SOB with minimal exertion and prefers to sit, cannot lie down flat, speaks in phrases, mild retractions, audible wheezing, pulse 100-120.    - SEVERE: Very SOB at rest, speaks in single words, struggling to breathe, sitting hunched forward, retractions, pulse > 120      normal 6. FEVER: "Do you have a fever?" If Yes, ask: "What is your temperature, how was it measured, and when did it start?"     no 7. CARDIAC HISTORY: "Do you have any history of heart disease?" (e.g., heart attack, congestive heart failure)      *No Answer* 8. LUNG HISTORY: "Do you have any history of lung disease?"  (e.g., pulmonary embolus, asthma, emphysema)     *No Answer* 9. PE RISK FACTORS: "Do you have a history of blood clots?" (or: recent major surgery, recent prolonged travel, bedridden)     *No Answer* 10. OTHER SYMPTOMS: "Do you have any other symptoms?" (e.g., runny nose, wheezing, chest pain)       Congestion- runny  nose 11. PREGNANCY: "Is there any chance you are pregnant?" "When was your last menstrual period?"       *No Answer* 12. TRAVEL: "Have you traveled out of the country in the last month?" (e.g., travel history, exposures)       *No Answer*  Protocols used: Cough - Acute Non-Productive-A-AH

## 2021-06-28 ENCOUNTER — Ambulatory Visit: Payer: Self-pay

## 2021-06-28 NOTE — Telephone Encounter (Signed)
°  Chief Complaint: cough Symptoms: cough, SOB at times Frequency: went away but came back for 2 days now Pertinent Negatives: NA Disposition: [] ED /[] Urgent Care (no appt availability in office) / [x] Appointment(In office/virtual)/ []  [place] [facility]/ [] Home Care/ [] Refused Recommended Disposition /[] [place] [facility]/ []  Follow-up with PCP Additional Notes: Pt was taking Hycodan but used it all and cough is back and nothing is helping. Pt had OV on 06/13/21.   Summary: cough, want refill of cogh medication   Pt called saying he came in a week a go for a cough and was given a prescription and he has finished taking it.  He said it has started over and he is coughing and again and would like a refill.  Cough medication hydrocodone in it.   [facility] [place]   CB#  [phone]        Reason for Disposition  [1] Continuous (nonstop) coughing interferes with work or school AND [2] no improvement using cough treatment per Care Advice  Answer Assessment - Initial Assessment Questions 1. ONSET: "When did the cough begin?"      2 days  2. SEVERITY: "How bad is the cough today?"      Mild to modorate 3. SPUTUM: "Describe the color of your sputum" (none, dry cough; clear, white, yellow, green)     No 4. HEMOPTYSIS: "Are you coughing up any blood?" If so ask: "How much?" (flecks, streaks, tablespoons, etc.)     NO 5. DIFFICULTY BREATHING: "Are you having difficulty breathing?" If Yes, ask: "How bad is it?" (e.g., mild, moderate, severe)    - MILD: No SOB at rest, mild SOB with walking, speaks normally in sentences, can lie down, no retractions, pulse < 100.    - MODERATE: SOB at rest, SOB with minimal exertion and prefers to sit, cannot lie down flat, speaks in phrases, mild retractions, audible wheezing, pulse 100-120.    - SEVERE: Very SOB at rest, speaks in single words, struggling to breathe, sitting hunched forward, retractions, pulse > 120      Mild at times 6. FEVER: "Do you  have a fever?" If Yes, ask: "What is your temperature, how was it measured, and when did it start?" No 10. OTHER SYMPTOMS: "Do you have any other symptoms?" (e.g., runny nose, wheezing, chest pain)       Runny nose  Protocols used: Cough - Acute Non-Productive-A-AH

## 2021-06-29 ENCOUNTER — Ambulatory Visit: Payer: Managed Care, Other (non HMO) | Admitting: Family Medicine

## 2021-06-29 ENCOUNTER — Other Ambulatory Visit: Payer: Self-pay

## 2021-06-29 ENCOUNTER — Encounter: Payer: Self-pay | Admitting: Family Medicine

## 2021-06-29 VITALS — BP 122/65 | HR 84 | Temp 98.3°F | Resp 16 | Wt 174.8 lb

## 2021-06-29 DIAGNOSIS — R053 Chronic cough: Secondary | ICD-10-CM

## 2021-06-29 DIAGNOSIS — R051 Acute cough: Secondary | ICD-10-CM

## 2021-06-29 DIAGNOSIS — R059 Cough, unspecified: Secondary | ICD-10-CM | POA: Diagnosis not present

## 2021-06-29 MED ORDER — HYDROCODONE BIT-HOMATROP MBR 5-1.5 MG/5ML PO SOLN: 5.0000 mL | Freq: Three times a day (TID) | ORAL | 0 refills | Status: DC | PRN

## 2021-06-29 MED ORDER — OMEPRAZOLE 20 MG PO CPDR: 20.0000 mg | DELAYED_RELEASE_CAPSULE | Freq: Every day | ORAL | 0 refills | Status: AC

## 2021-06-29 NOTE — Assessment & Plan Note (Signed)
Chronic, intermittent per chart review. Worsened by URI. Given duration of symptoms, will check CXR, previous XR 2017 with nodule. Never smoker. Also obese, will trial PPI. Hycodan syrup only relief, refilled. No signs/symptoms to concern for new onset heart failure. F/u 2 months.

## 2021-06-29 NOTE — Progress Notes (Signed)
° °  SUBJECTIVE:   CHIEF COMPLAINT / HPI:   COUGH - seen previously 1/30 for same, robatussin, tessalon, cough drops no help. Given cough syrup, helped some. Taking every night.   Duration: weeks Circumstances of initial development of cough: URI Cough severity: moderate Cough description: non productive Aggravating factors:  worse at night and talking Alleviating factors: Halls cough drops, cough syrup Status:  fluctuating Treatments attempted: cold/sinus, cough syrup, and nasal CCS Wheezing: no Shortness of breath: no Chest pain: no Chest tightness:no Nasal congestion: no Runny nose:  occasional Frequent throat clearing or swallowing: yes Hemoptysis: no Fevers: no Night sweats: no Weight loss: yes but working on portion control for diabetes Heartburn: no Recent foreign travel: no Tuberculosis contacts: no No leg swelling. Never smoker.  Typically will get a cough every fall.    OBJECTIVE:   BP 122/65 (BP Location: Right Arm, Patient Position: Sitting, Cuff Size: Large)    Pulse 84    Temp 98.3 F (36.8 C) (Oral)    Resp 16    Wt 174 lb 12.8 oz (79.3 kg)    SpO2 97%    BMI 31.97 kg/m   Gen: well appearing, in NAD Card: RRR Lungs: CTAB Ext: WWP, no edema  ASSESSMENT/PLAN:   Cough Chronic, intermittent per chart review. Worsened by URI. Given duration of symptoms, will check CXR, previous XR 2017 with nodule. Never smoker. Also obese, will trial PPI. Hycodan syrup only relief, refilled. No signs/symptoms to concern for new onset heart failure. F/u 2 months.     Myles Gip, DO

## 2021-07-04 ENCOUNTER — Other Ambulatory Visit: Payer: Self-pay | Admitting: Family Medicine

## 2021-07-04 DIAGNOSIS — I10 Essential (primary) hypertension: Secondary | ICD-10-CM

## 2021-07-04 NOTE — Telephone Encounter (Signed)
Medication Refill - Medication: losartan-hydrochlorothiazide (HYZAAR) 50-12.5 MG tablet (Patient is out and was just notified mail order did not have in stock)     Has the patient contacted their pharmacy? Yes   (Agent: If yes, when and what did the pharmacy advise?) Mail Order does not have medication therefore request a new script sent to retail [facility] advised to call [facility]CP and have a new script sent in  Preferred Pharmacy (with phone number or street name):  [id number] [place] [place] Phone:  [phone]  Fax:  [phone]      Has the patient been seen for an appointment in the last year OR does the patient have an upcoming appointment? Yes.    Agent: Please be advised that RX refills may take up to 3 business days. We ask that you follow-up with your pharmacy.

## 2021-07-05 NOTE — Telephone Encounter (Signed)
This pt wife had understanding that when received phone call yesterday that med would be ready for PU today as was [facility] [place] [facility] so they are confused, he has been out of med now a couple days due to shortage. Pls FU with PT to advise, Spouse or pt # [phone]

## 2021-07-05 NOTE — Telephone Encounter (Signed)
Requested medication (s) are due for refill today: yes  Requested medication (s) are on the active medication list: yes  Last refill:  08/16/20 #90/3  Future visit scheduled: yes  Notes to clinic:  Unable to refill per protocol due to failed labs, no updated results.      Requested Prescriptions  Pending Prescriptions Disp Refills   losartan-hydrochlorothiazide (HYZAAR) 50-12.5 MG tablet 90 tablet 3    Sig: Take 1 tablet by mouth daily. TAKE IN PLACE OF LOSARTAN 50MG     Cardiovascular: ARB + Diuretic Combos Failed - 07/05/2021 11:27 AM      Failed - K in normal range and within 180 days    Potassium  Date Value Ref Range Status  10/27/2020 4.3 3.5 - 5.2 mmol/L Final          Failed - Na in normal range and within 180 days    Sodium  Date Value Ref Range Status  10/27/2020 139 134 - 144 mmol/L Final          Failed - Cr in normal range and within 180 days    Creatinine, Ser  Date Value Ref Range Status  10/27/2020 1.28 (H) 0.76 - 1.27 mg/dL Final          Failed - eGFR is 10 or above and within 180 days    GFR calc Af Amer  Date Value Ref Range Status  10/23/2019 62 >59 mL/min/1.73 Final    Comment:    **[facility] currently reports eGFR in compliance with the current**   recommendations of the [employer]. [facility] will   update reporting as new guidelines are published from the NKF-ASN   Task force.    GFR calc non Af Amer  Date Value Ref Range Status  10/23/2019 54 (L) >59 mL/min/1.73 Final   eGFR  Date Value Ref Range Status  10/27/2020 65 >59 mL/min/1.73 Final          Passed - Patient is not pregnant      Passed - Last BP in normal range    BP Readings from Last 1 Encounters:  06/29/21 122/65          Passed - Valid encounter within last 6 months    Recent Outpatient Visits           6 days ago Chronic cough   [place], DO   3 weeks ago Upper respiratory tract infection, unspecified type    [facility] Birdie Sons, MD   2 months ago Type 2 diabetes mellitus with hyperglycemia, with long-term current use of insulin [facility])   [facility] Birdie Sons, MD   5 months ago Cough   [facility] Gwyneth Sprout, FNP   5 months ago Cough   [place], PA-C       Future Appointments             In 1 month Fisher, Kirstie Peri, MD [facility], [place]   In 1 month Fisher, Kirstie Peri, MD [facility], [place]

## 2021-07-06 MED ORDER — LOSARTAN POTASSIUM-HCTZ 50-12.5 MG PO TABS: 1.0000 | ORAL_TABLET | Freq: Every day | ORAL | 3 refills | Status: DC

## 2021-07-12 ENCOUNTER — Other Ambulatory Visit: Payer: Self-pay

## 2021-07-12 ENCOUNTER — Ambulatory Visit
Admission: RE | Admit: 2021-07-12 | Discharge: 2021-07-12 | Disposition: A | Discharge status: Home / Self Care | Payer: Managed Care, Other (non HMO) | Source: Ambulatory Visit | Attending: Family Medicine | Admitting: Family Medicine

## 2021-07-12 ENCOUNTER — Ambulatory Visit
Admission: RE | Admit: 2021-07-12 | Discharge: 2021-07-12 | Disposition: A | Discharge status: Home / Self Care | Payer: Managed Care, Other (non HMO) | Attending: Family Medicine | Admitting: Family Medicine

## 2021-07-12 DIAGNOSIS — R053 Chronic cough: Secondary | ICD-10-CM | POA: Insufficient documentation

## 2021-07-14 ENCOUNTER — Telehealth: Payer: Self-pay

## 2021-07-14 NOTE — Telephone Encounter (Signed)
-----   Message from Beverlee Nims, [place] sent at 07/13/2021  2:53 PM EST ----- ? ?----- Message ----- ?From: Myles Gip, DO ?Sent: 07/13/2021   2:50 PM EST ?To: [facility] ? ?CXR is normal.  ? ?

## 2021-07-14 NOTE — Telephone Encounter (Signed)
Patient advised of chest x ray results. Patient wants to know what could be causing the cough since the X ray is normal. He has been using the cough syrup (which has helped some), but still has the same cough. Patient would like advice on the next steps. Please advise.  ?

## 2021-07-14 NOTE — Telephone Encounter (Signed)
Maybe respiratory drainage from previous infection, weather changes or pollens. Recommend he try montelukast 10mg  once every day, #30, rf x 1. If not better in 2-3 weeks then will need pulmonary referral.  ?

## 2021-07-15 MED ORDER — MONTELUKAST SODIUM 10 MG PO TABS: 10.0000 mg | ORAL_TABLET | Freq: Every day | ORAL | 1 refills | Status: DC

## 2021-07-15 NOTE — Telephone Encounter (Signed)
Patient has been advised. KW 

## 2021-07-21 ENCOUNTER — Other Ambulatory Visit: Payer: Self-pay | Admitting: Family Medicine

## 2021-08-01 ENCOUNTER — Other Ambulatory Visit: Payer: Self-pay | Admitting: Family Medicine

## 2021-08-04 ENCOUNTER — Telehealth: Payer: Self-pay | Admitting: Family Medicine

## 2021-08-04 ENCOUNTER — Other Ambulatory Visit: Payer: Self-pay

## 2021-08-04 MED ORDER — MONTELUKAST SODIUM 10 MG PO TABS: 10.0000 mg | ORAL_TABLET | Freq: Every day | ORAL | 0 refills | Status: DC

## 2021-08-04 NOTE — Telephone Encounter (Signed)
[facility] faxed refill request for the following medications: ? ?montelukast (SINGULAIR) 10 MG tablet  ? ?Please advise. ? ?

## 2021-08-05 ENCOUNTER — Other Ambulatory Visit: Payer: Self-pay

## 2021-08-05 ENCOUNTER — Encounter: Payer: Self-pay | Admitting: Family Medicine

## 2021-08-05 ENCOUNTER — Ambulatory Visit: Payer: Managed Care, Other (non HMO) | Admitting: Family Medicine

## 2021-08-05 VITALS — BP 136/67 | HR 90 | Temp 98.7°F | Resp 16 | Wt 177.0 lb

## 2021-08-05 DIAGNOSIS — Z794 Long term (current) use of insulin: Secondary | ICD-10-CM | POA: Diagnosis not present

## 2021-08-05 DIAGNOSIS — L84 Corns and callosities: Secondary | ICD-10-CM | POA: Diagnosis not present

## 2021-08-05 DIAGNOSIS — M79672 Pain in left foot: Secondary | ICD-10-CM | POA: Diagnosis not present

## 2021-08-05 DIAGNOSIS — Z23 Encounter for immunization: Secondary | ICD-10-CM

## 2021-08-05 DIAGNOSIS — E1165 Type 2 diabetes mellitus with hyperglycemia: Secondary | ICD-10-CM | POA: Diagnosis not present

## 2021-08-05 LAB — POCT GLYCOSYLATED HEMOGLOBIN (HGB A1C)
Est. average glucose Bld gHb Est-mCnc: 137
Hemoglobin A1C: 6.4 % — AB (ref 4.0–5.6)

## 2021-08-05 MED ORDER — CEPHALEXIN 500 MG PO CAPS: 500.0000 mg | ORAL_CAPSULE | Freq: Four times a day (QID) | ORAL | 0 refills | Status: AC

## 2021-08-05 NOTE — Progress Notes (Signed)
?  ? ?I,Sulibeya S Dimas,acting as a scribe for Lelon Huh, MD.,have documented all relevant documentation on the behalf of Lelon Huh, MD,as directed by  Lelon Huh, MD while in the presence of Lelon Huh, MD. ? ? ?Established patient visit ? ? ?Patient: Robert [name]   DOB: [birth year]   61 y.o. Male  MRN: [id number] ?Visit Date: 08/05/2021 ? ?Today's healthcare provider: Lelon Huh, MD  ? ?Chief Complaint  ?Patient presents with  ?? Diabetes  ?? Foot Pain  ? ?Subjective  ?  ?HPI  ?Diabetes Mellitus Type II, Follow-up ? ?Lab Results  ?Component Value Date  ? HGBA1C 6.4 (A) 08/05/2021  ? HGBA1C 5.9 (A) 04/29/2021  ? HGBA1C 6.7 (H) 10/27/2020  ? ?Wt Readings from Last 3 Encounters:  ?08/05/21 177 lb (80.3 kg)  ?06/29/21 174 lb 12.8 oz (79.3 kg)  ?06/13/21 175 lb (79.4 kg)  ? ?Last seen for diabetes 3 months ago.  ?Management since then includes continue same medication regiment. Marland Kitchen ?He reports excellent compliance with treatment. ?He is not having side effects.  ?Symptoms: ?No fatigue Yes foot ulcerations  ?No appetite changes No nausea  ?No paresthesia of the feet  No polydipsia  ?No polyuria No visual disturbances   ?No vomiting   ? ? ?Home blood sugar records:  not being checked ? ?Episodes of hypoglycemia? No  ?  ?Current insulin regiment: 60 units daily ?Most Recent Eye Exam: UTD  ?Current exercise: walking ?Current diet habits: in general, a "healthy" diet   ? ?Pertinent Labs: ?Lab Results  ?Component Value Date  ? CHOL 129 10/27/2020  ? HDL 53 10/27/2020  ? [place] 64 10/27/2020  ? TRIG 57 10/27/2020  ? CHOLHDL 2.4 10/27/2020  ? Lab Results  ?Component Value Date  ? NA 139 10/27/2020  ? K 4.3 10/27/2020  ? CREATININE 1.28 (H) 10/27/2020  ? EGFR 65 10/27/2020  ? MICROALBUR 100 11/23/2016  ?  ? ?---------------------------------------------------------------------------------------------------  ?Patient here today C/O left foot/ankle pain x 4 weeks, he reports pain is worsening. Patient denies any  injuries. Patient reports taking ibuprofen 164m daily for pain, reports mild pain control. Patient reports pain is worse with activity.  ? ? ?Medications: ?Outpatient Medications Prior to Visit  ?Medication Sig  ?? aspirin EC 81 MG tablet Take 81 mg by mouth daily.  ?? B-D UF III MINI PEN NEEDLES 31G X 5 MM MISC USE 4 TIMES DAILY  ?? baclofen (LIORESAL) 10 MG tablet Take 1 tablet (10 mg total) by mouth 3 (three) times daily.  ?? Blood Glucose Monitoring Suppl (ACCU-CHEK AVIVA PLUS) w/Device KIT Use to check blood sugar three times daily for insulin dependent diabetes (E11.9)  ?? diltiazem (TIAZAC) 180 MG 24 hr capsule TAKE 1 CAPSULE BY MOUTH  DAILY  ?? fluticasone (FLONASE) 50 MCG/ACT nasal spray Place 2 sprays into both nostrils daily.  ?? glucose blood test strip Use as instructed to check sugar three times daily for insulin dependent diabets.  ?? HYDROcodone bit-homatropine (HYCODAN) 5-1.5 MG/5ML syrup Take 5 mLs by mouth every 8 (eight) hours as needed for cough.  ?? hydrOXYzine (ATARAX/VISTARIL) 25 MG tablet TAKE 1 TABLET (25 MG TOTAL) BY MOUTH AT BEDTIME AS NEEDED (INSOMNIA).  ?? ibuprofen (ADVIL) 800 MG tablet TAKE 1 TABLET BY MOUTH  TWICE DAILY AS NEEDED  ?? JARDIANCE 10 MG TABS tablet TAKE 1 TABLET BY MOUTH  DAILY  ?? Lancets (ONETOUCH ULTRASOFT) lancets Use as instructed  ?? lidocaine (LIDODERM) 5 % Place 1 patch onto the  skin daily. Remove & Discard patch within 12 hours or as directed by MD  ?? losartan-hydrochlorothiazide (HYZAAR) 50-12.5 MG tablet Take 1 tablet by mouth daily.  ?? meloxicam (MOBIC) 15 MG tablet Take 1 tablet (15 mg total) by mouth daily as needed (joint pain).  ?? metFORMIN (GLUCOPHAGE) 1000 MG tablet TAKE 1 TABLET BY MOUTH  DAILY  ?? montelukast (SINGULAIR) 10 MG tablet Take 1 tablet (10 mg total) by mouth at bedtime.  ?? omeprazole (PRILOSEC) 20 MG capsule Take 1 capsule (20 mg total) by mouth daily.  ?? pravastatin (PRAVACHOL) 40 MG tablet TAKE 1 TABLET BY MOUTH  DAILY  ??  sildenafil (VIAGRA) 100 MG tablet Take 0.5-1 tablets (50-100 mg total) by mouth daily as needed for erectile dysfunction.  ?? SOLIQUA 100-33 UNT-MCG/ML SOPN INJECT SUBCUTANEOUSLY 60 UNITS  AS DIRECTED DAILY  ?? tadalafil (CIALIS) 20 MG tablet Take 0.5-1 tablets (10-20 mg total) by mouth every other day as needed for erectile dysfunction.  ?? traMADol (ULTRAM) 50 MG tablet Take 1 tablet (50 mg total) by mouth every 8 (eight) hours as needed.  ?? valACYclovir (VALTREX) 1000 MG tablet Take 1 tablet (1,000 mg total) by mouth 3 (three) times daily.  ? ?No facility-administered medications prior to visit.  ? ? ?Review of Systems  ?Constitutional:  Negative for appetite change and fatigue.  ?Respiratory:  Negative for cough, chest tightness and shortness of breath.   ?Cardiovascular:  Negative for chest pain, palpitations and leg swelling.  ? ? ?  Objective  ?  ?BP 136/67 (BP Location: Left Arm, Patient Position: Sitting, Cuff Size: Large)   Pulse 90   Temp 98.7 ?F (37.1 ?C) (Oral)   Resp 16   Wt 177 lb (80.3 kg)   BMI 32.37 kg/m?  ? ? ?Physical Exam  ? ?General: Appearance:    Mildly obese male in no acute distress  ?Eyes:    PERRL, conjunctiva/corneas clear, EOM's intact       ?Lungs:     Clear to auscultation bilaterally, respirations unlabored  ?Heart:    Normal heart rate. Normal rhythm. No murmurs, rubs, or gallops.    ?MS:   Above knee amputation of right lower extremity is noted.  Large tender callus left lateral foot with 2-3 cm surrounding erythema.   ?Neurologic:   Awake, alert, oriented x 3. No apparent focal neurological defect.   ?   ?  ? ?Results for orders placed or performed in visit on 08/05/21  ?POCT glycosylated hemoglobin (Hb A1C)  ?Result Value Ref Range  ? Hemoglobin A1C 6.4 (A) 4.0 - 5.6 %  ? Est. average glucose Bld gHb Est-mCnc 137   ? ? Assessment & Plan  ?  ? ? 1. Type 2 diabetes mellitus with hyperglycemia, with long-term current use of insulin ([place]) ?Well controlled, a1c up slighty.  Continue current medications.  Improve diet as best as possible.  ? ?2. Need for Tdap vaccination ? ?- Tdap vaccine greater than or equal to 7yo IM ? ?3. Left foot pain ?Has some surrounding tender erythema. Cover with  cephALEXin (KEFLEX) 500 MG capsule; Take 1 capsule (500 mg total) by mouth 4 (four) times daily for 10 days.  Dispense: 40 capsule; Refill: 0 ?- Ambulatory referral to Podiatry ?Is on ibuprofen. Wear foot pads as much as possible  ? ?4. Callus of foot ? ?- cephALEXin (KEFLEX) 500 MG capsule; Take 1 capsule (500 mg total) by mouth 4 (four) times daily for 10 days.  Dispense: 40 capsule;  Refill: 0  ?   ? ?The entirety of the information documented in the History of Present Illness, Review of Systems and Physical Exam were personally obtained by me. Portions of this information were initially documented by the CMA and reviewed by me for thoroughness and accuracy.   ? ? ?Lelon Huh, MD  ?[facility] ?[phone] (phone) ?[phone] (fax) ? ?[facility]  ?

## 2021-08-09 ENCOUNTER — Other Ambulatory Visit: Payer: Self-pay | Admitting: Family Medicine

## 2021-08-10 ENCOUNTER — Encounter: Payer: Self-pay | Admitting: Podiatry

## 2021-08-10 ENCOUNTER — Ambulatory Visit: Payer: Managed Care, Other (non HMO) | Admitting: Podiatry

## 2021-08-10 ENCOUNTER — Ambulatory Visit: Payer: Managed Care, Other (non HMO)

## 2021-08-10 DIAGNOSIS — D2372 Other benign neoplasm of skin of left lower limb, including hip: Secondary | ICD-10-CM | POA: Diagnosis not present

## 2021-08-10 LAB — MICROALBUMIN / CREATININE URINE RATIO
Creatinine, Urine: 103.2 mg/dL
Microalb/Creat Ratio: 10 mg/g creat (ref 0–29)
Microalbumin, Urine: 10.7 ug/mL

## 2021-08-10 LAB — SPECIMEN STATUS REPORT
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
## 2021-08-27 ENCOUNTER — Other Ambulatory Visit: Payer: Self-pay | Admitting: Family Medicine

## 2021-08-27 DIAGNOSIS — F5101 Primary insomnia: Secondary | ICD-10-CM

## 2021-09-01 ENCOUNTER — Other Ambulatory Visit: Payer: Self-pay | Admitting: Family Medicine

## 2021-09-01 DIAGNOSIS — I10 Essential (primary) hypertension: Secondary | ICD-10-CM

## 2021-09-02 ENCOUNTER — Ambulatory Visit: Payer: Managed Care, Other (non HMO) | Admitting: Family Medicine

## 2021-09-15 ENCOUNTER — Other Ambulatory Visit: Payer: Self-pay | Admitting: Family Medicine

## 2021-09-15 DIAGNOSIS — F5101 Primary insomnia: Secondary | ICD-10-CM

## 2021-09-28 ENCOUNTER — Ambulatory Visit: Payer: Managed Care, Other (non HMO) | Admitting: Podiatry

## 2021-10-12 ENCOUNTER — Ambulatory Visit: Payer: Managed Care, Other (non HMO) | Admitting: Podiatry

## 2021-10-31 ENCOUNTER — Ambulatory Visit: Payer: Managed Care, Other (non HMO) | Admitting: Podiatry

## 2021-10-31 ENCOUNTER — Encounter: Payer: Self-pay | Admitting: Podiatry

## 2021-10-31 DIAGNOSIS — D2372 Other benign neoplasm of skin of left lower limb, including hip: Secondary | ICD-10-CM | POA: Diagnosis not present

## 2021-10-31 NOTE — Progress Notes (Signed)
He presents today for follow-up of his painful callus to the plantar aspect of the fifth metatarsal head of his left foot.  He states that he seems to be doing much better.  Objective: Vital signs are stable he is alert and oriented x3 reactive hyperkeratotic benign lesion beneath the fifth metatarsal base of the left foot does not demonstrate any erythema edema cellulitis drainage or odor.  Assessment: Benign skin lesion left fifth met base.  Plan: Chemical destruction of this lesion under occlusion to be left on for 3 days then washed off thoroughly.  He understands this is amendable to it we will follow-up with me only on an as-needed basis. 
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
## 2021-11-18 ENCOUNTER — Encounter: Payer: Self-pay | Admitting: Family Medicine

## 2021-11-18 ENCOUNTER — Ambulatory Visit: Payer: Managed Care, Other (non HMO) | Admitting: Family Medicine

## 2021-11-18 VITALS — BP 127/63 | HR 99 | Temp 98.4°F | Resp 14 | Ht 65.0 in | Wt 181.0 lb

## 2021-11-18 DIAGNOSIS — Z0000 Encounter for general adult medical examination without abnormal findings: Secondary | ICD-10-CM

## 2021-11-18 DIAGNOSIS — E11319 Type 2 diabetes mellitus with unspecified diabetic retinopathy without macular edema: Secondary | ICD-10-CM

## 2021-11-18 DIAGNOSIS — N529 Male erectile dysfunction, unspecified: Secondary | ICD-10-CM

## 2021-11-18 DIAGNOSIS — Z794 Long term (current) use of insulin: Secondary | ICD-10-CM

## 2021-11-18 DIAGNOSIS — I10 Essential (primary) hypertension: Secondary | ICD-10-CM

## 2021-11-18 DIAGNOSIS — E1165 Type 2 diabetes mellitus with hyperglycemia: Secondary | ICD-10-CM

## 2021-11-18 DIAGNOSIS — Z125 Encounter for screening for malignant neoplasm of prostate: Secondary | ICD-10-CM | POA: Diagnosis not present

## 2021-11-18 MED ORDER — SOLIQUA 100-33 UNT-MCG/ML ~~LOC~~ SOPN: PEN_INJECTOR | SUBCUTANEOUS | 3 refills | Status: DC

## 2021-11-18 MED ORDER — TADALAFIL 20 MG PO TABS
10.0000 mg | ORAL_TABLET | ORAL | 11 refills | Status: DC | PRN
Start: 2021-11-18 — End: 2022-08-11

## 2021-11-22 LAB — LIPID PANEL
Chol/HDL Ratio: 2.5 ratio (ref 0.0–5.0)
Cholesterol, Total: 122 mg/dL (ref 100–199)
HDL: 49 mg/dL (ref >39)
LDL Chol Calc (NIH): 61 mg/dL (ref 0–99)
Triglycerides: 54 mg/dL (ref 0–149)
VLDL Cholesterol Cal: 12 mg/dL (ref 5–40)

## 2021-11-22 LAB — COMPREHENSIVE METABOLIC PANEL
ALT: 26 IU/L (ref 0–44)
AST: 27 IU/L (ref 0–40)
Albumin/Globulin Ratio: 2.1 (ref 1.2–2.2)
Albumin: 4.8 g/dL (ref 3.8–4.9)
Alkaline Phosphatase: 83 IU/L (ref 44–121)
BUN/Creatinine Ratio: 14 (ref 9–20)
BUN: 19 mg/dL (ref 6–24)
Bilirubin Total: 0.5 mg/dL (ref 0.0–1.2)
CO2: 23 mmol/L (ref 20–29)
Calcium: 10.2 mg/dL (ref 8.7–10.2)
Chloride: 102 mmol/L (ref 96–106)
Creatinine, Ser: 1.36 mg/dL — ABNORMAL HIGH (ref 0.76–1.27)
Globulin, Total: 2.3 g/dL (ref 1.5–4.5)
Glucose: 107 mg/dL — ABNORMAL HIGH (ref 70–99)
Potassium: 4.7 mmol/L (ref 3.5–5.2)
Sodium: 142 mmol/L (ref 134–144)
Total Protein: 7.1 g/dL (ref 6.0–8.5)
eGFR: 60 mL/min/{1.73_m2} (ref >59)

## 2021-11-22 LAB — CBC
Hematocrit: 44.4 % (ref 37.5–51.0)
Hemoglobin: 15.2 g/dL (ref 13.0–17.7)
MCH: 27.5 pg (ref 26.6–33.0)
MCHC: 34.2 g/dL (ref 31.5–35.7)
MCV: 80 fL (ref 79–97)
Platelets: 353 10*3/uL (ref 150–450)
RBC: 5.52 x10E6/uL (ref 4.14–5.80)
RDW: 13.4 % (ref 11.6–15.4)
WBC: 5.5 10*3/uL (ref 3.4–10.8)

## 2021-11-22 LAB — PSA TOTAL (REFLEX TO FREE): Prostate Specific Ag, Serum: 0.5 ng/mL (ref 0.0–4.0)

## 2021-11-22 LAB — HEMOGLOBIN A1C
Est. average glucose Bld gHb Est-mCnc: 166 mg/dL
Hgb A1c MFr Bld: 7.4 % — ABNORMAL HIGH (ref 4.8–5.6)

## 2022-01-05 ENCOUNTER — Other Ambulatory Visit: Payer: Self-pay | Admitting: Family Medicine

## 2022-03-15 ENCOUNTER — Other Ambulatory Visit: Payer: Self-pay | Admitting: Family Medicine

## 2022-03-15 DIAGNOSIS — E7800 Pure hypercholesterolemia, unspecified: Secondary | ICD-10-CM

## 2022-03-20 IMAGING — CR DG CHEST 2V
1 series · 2 of 2 positions shown · non-contrast
Comparison: 07/30/2015

CLINICAL DATA: Chronic cough

EXAM:
CHEST - 2 VIEW

[Series 1: dg chest 2 view · 0.14mm/px · 2 of 2 slices shown]
[im 1/2]
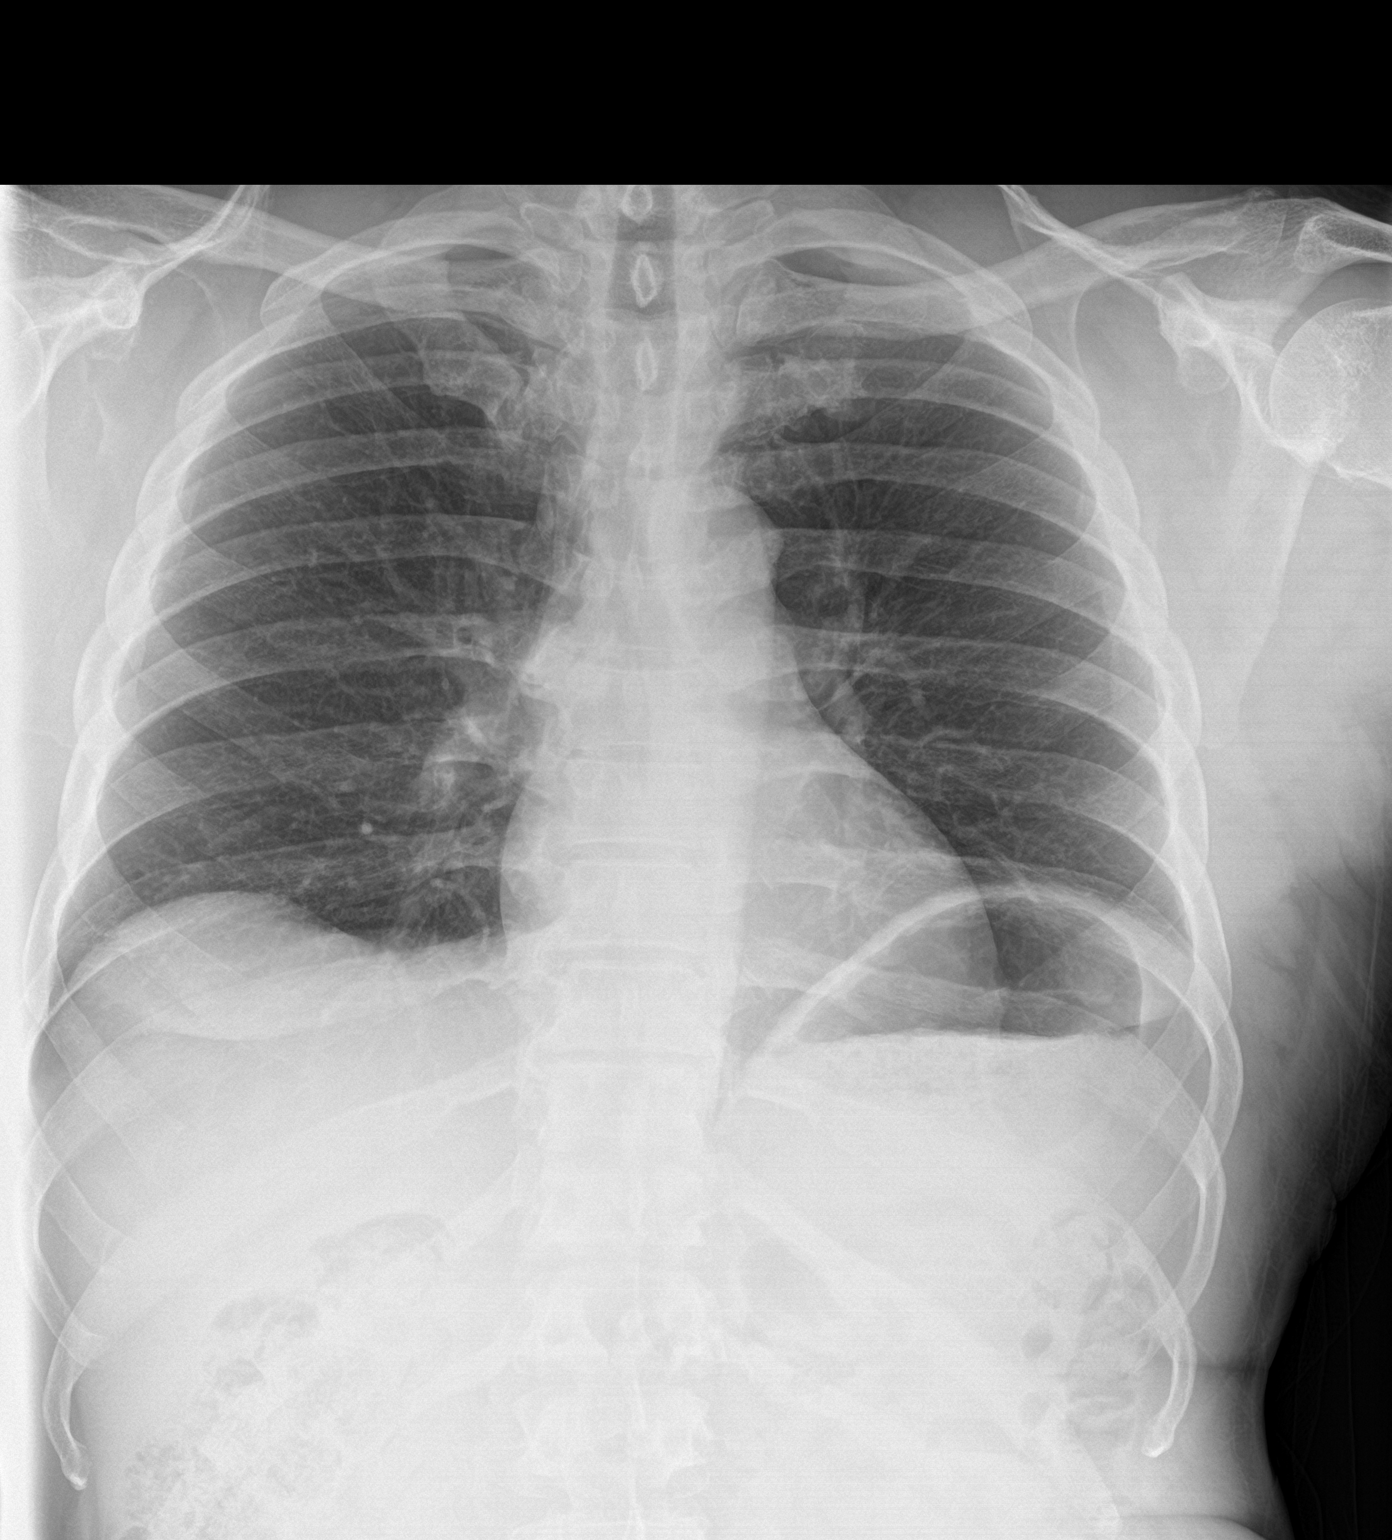
[im 2/2]
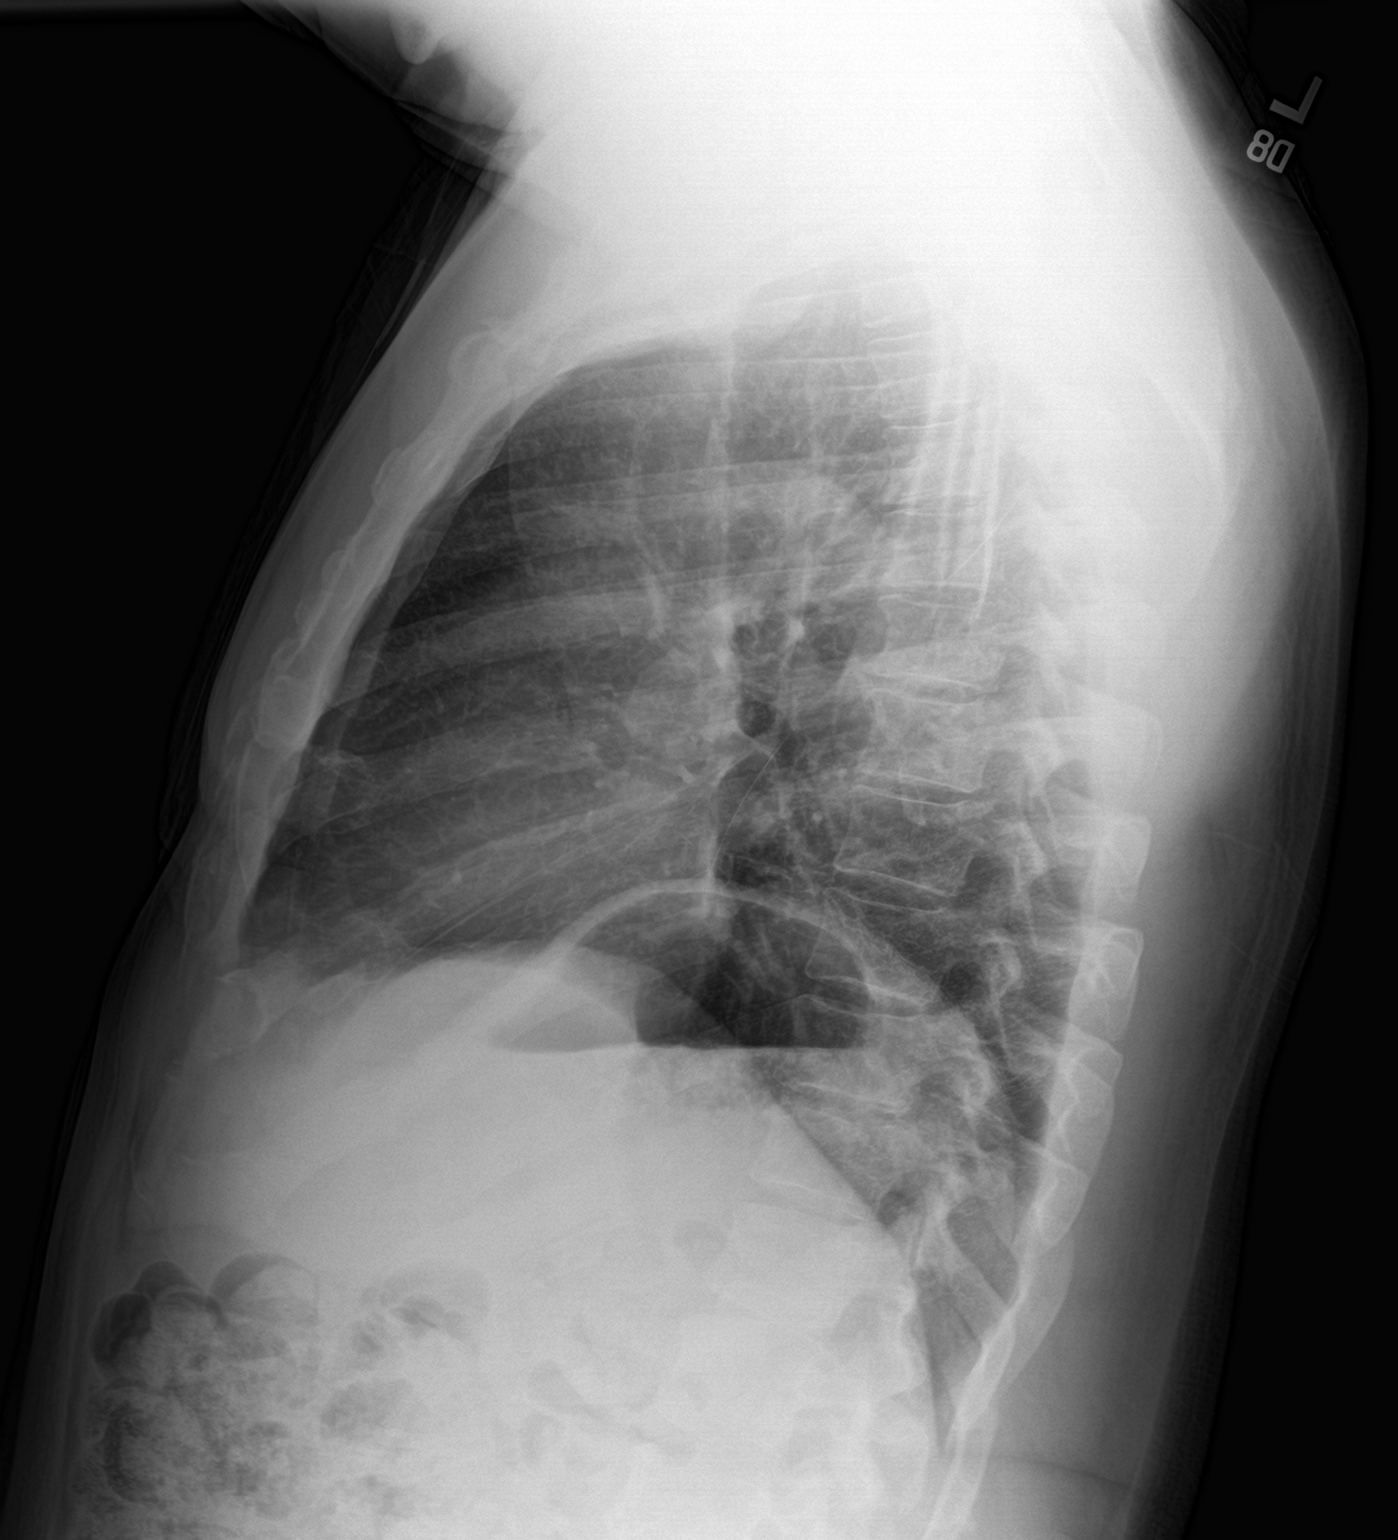

[2 of 2 positions shown; findings below may reference images not displayed]

FINDINGS: The heart size and mediastinal contours are within normal limits.
Both lungs are clear. Left hemidiaphragm is elevated. The visualized
skeletal structures are unremarkable.
IMPRESSION: No active cardiopulmonary disease.

## 2022-03-22 ENCOUNTER — Ambulatory Visit (INDEPENDENT_AMBULATORY_CARE_PROVIDER_SITE_OTHER): Payer: Managed Care, Other (non HMO) | Admitting: Family Medicine

## 2022-03-22 ENCOUNTER — Encounter: Payer: Self-pay | Admitting: Family Medicine

## 2022-03-22 VITALS — BP 133/69 | HR 96 | Resp 16 | Ht 65.0 in | Wt 182.0 lb

## 2022-03-22 DIAGNOSIS — Z794 Long term (current) use of insulin: Secondary | ICD-10-CM

## 2022-03-22 DIAGNOSIS — Z23 Encounter for immunization: Secondary | ICD-10-CM

## 2022-03-22 DIAGNOSIS — E11319 Type 2 diabetes mellitus with unspecified diabetic retinopathy without macular edema: Secondary | ICD-10-CM | POA: Diagnosis not present

## 2022-03-22 DIAGNOSIS — M778 Other enthesopathies, not elsewhere classified: Secondary | ICD-10-CM

## 2022-03-22 LAB — POCT GLYCOSYLATED HEMOGLOBIN (HGB A1C)
Est. average glucose Bld gHb Est-mCnc: 183
Hemoglobin A1C: 8 % — AB (ref 4.0–5.6)

## 2022-03-22 NOTE — Progress Notes (Signed)
   I,Tiffany J Bragg,acting as a scribe for Donald Fisher, MD.,have documented all relevant documentation on the behalf of Donald Fisher, MD,as directed by  Donald Fisher, MD while in the presence of Donald Fisher, MD.   Established patient visit   Patient: Robert [name]   DOB: 07/17/1960   61 y.o. Male  MRN: [id number] Visit Date: 03/22/2022  Today's healthcare provider: Donald Fisher, MD   Chief Complaint  Patient presents with   Diabetes   Hypertension   Subjective    HPI  Diabetes Mellitus Type II, follow-up  Lab Results  Component Value Date   HGBA1C 8.0 (A) 03/22/2022   HGBA1C 7.4 (H) 11/21/2021   HGBA1C 6.4 (A) 08/05/2021   Last seen for diabetes 4 months ago.  Management since then includes continuing the same treatment. He reports excellent compliance with treatment, although the was out of insulin for about 4 days in October waiting for insulin to arrive in the mail. Has also been using less than usual in order to stretch out supply.  He is not having side effects.   Home blood sugar records:  not checked  Episodes of hypoglycemia? No    Current insulin regiment: 60 units/daily Most Recent Eye Exam: is scheduled for 11/29  --------------------------------------------------------------------------------------------------- Hypertension, follow-up  BP Readings from Last 3 Encounters:  03/22/22 133/69  11/18/21 127/63  08/05/21 136/67   Wt Readings from Last 3 Encounters:  03/22/22 182 lb (82.6 kg)  11/18/21 181 lb (82.1 kg)  08/05/21 177 lb (80.3 kg)     He was last seen for hypertension 4 months ago.  BP at that visit was 127/63. Management since that visit includes no changes. He reports excellent compliance with treatment. He is not having side effects.  He is not exercising. He is adherent to low salt diet.   Outside blood pressures are is not checked regularly.  He does not smoke.  Use of agents associated with hypertension: none.    ------------------------------------------------------------------------------------------------  Medications: Outpatient Medications Prior to Visit  Medication Sig   aspirin EC 81 MG tablet Take 81 mg by mouth daily.   B-D UF III MINI PEN NEEDLES 31G X 5 MM MISC USE 4 TIMES DAILY   baclofen (LIORESAL) 10 MG tablet Take 1 tablet (10 mg total) by mouth 3 (three) times daily.   Blood Glucose Monitoring Suppl (ACCU-CHEK AVIVA PLUS) w/Device KIT Use to check blood sugar three times daily for insulin dependent diabetes (E11.9)   diltiazem (TIAZAC) 180 MG 24 hr capsule TAKE 1 CAPSULE BY MOUTH  DAILY   fluticasone (FLONASE) 50 MCG/ACT nasal spray Place 2 sprays into both nostrils daily.   glucose blood test strip Use as instructed to check sugar three times daily for insulin dependent diabets.   hydrOXYzine (ATARAX) 25 MG tablet TAKE 1 TABLET (25 MG TOTAL) BY MOUTH EVERY DAY AT BEDTIME AS NEEDED FOR INSOMNIA   ibuprofen (ADVIL) 800 MG tablet TAKE 1 TABLET BY MOUTH  TWICE DAILY AS NEEDED   Insulin Glargine-Lixisenatide (SOLIQUA) 100-33 UNT-MCG/ML SOPN INJECT SUBCUTANEOUSLY 60 UNITS  AS DIRECTED DAILY   JARDIANCE 10 MG TABS tablet TAKE 1 TABLET BY MOUTH  DAILY   Lancets (ONETOUCH ULTRASOFT) lancets Use as instructed   lidocaine (LIDODERM) 5 % Place 1 patch onto the skin daily. Remove & Discard patch within 12 hours or as directed by MD   losartan-hydrochlorothiazide (HYZAAR) 50-12.5 MG tablet TAKE 1 TABLET BY MOUTH  DAILY   metFORMIN (GLUCOPHAGE) 1000 MG tablet TAKE   1 TABLET BY MOUTH  DAILY   montelukast (SINGULAIR) 10 MG tablet Take 1 tablet (10 mg total) by mouth at bedtime.   omeprazole (PRILOSEC) 20 MG capsule Take 1 capsule (20 mg total) by mouth daily.   pravastatin (PRAVACHOL) 40 MG tablet TAKE 1 TABLET BY MOUTH DAILY   tadalafil (CIALIS) 20 MG tablet Take 0.5-1 tablets (10-20 mg total) by mouth every other day as needed for erectile dysfunction.   traMADol (ULTRAM) 50 MG tablet Take 1  tablet (50 mg total) by mouth every 8 (eight) hours as needed.   valACYclovir (VALTREX) 1000 MG tablet Take 1 tablet (1,000 mg total) by mouth 3 (three) times daily.   No facility-administered medications prior to visit.    Review of Systems     Objective    BP 133/69 (BP Location: Right Arm, Patient Position: Sitting, Cuff Size: Large)   Pulse 96   Resp 16   Ht 5' 5" (1.651 m)   Wt 182 lb (82.6 kg)   SpO2 97%   BMI 30.29 kg/m    Physical Exam   General: Appearance:    Mildly obese male in no acute distress  Eyes:    PERRL, conjunctiva/corneas clear, EOM's intact       Lungs:     Clear to auscultation bilaterally, respirations unlabored  Heart:    Normal heart rate. Normal rhythm. No murmurs, rubs, or gallops.    MS:   Above knee amputation of right lower extremity is noted.    Neurologic:   Awake, alert, oriented x 3. No apparent focal neurological defect.         Results for orders placed or performed in visit on 03/22/22  POCT glycosylated hemoglobin (Hb A1C)  Result Value Ref Range   Hemoglobin A1C 8.0 (A) 4.0 - 5.6 %   Est. average glucose Bld gHb Est-mCnc 183     Assessment & Plan     1. Type 2 diabetes mellitus with retinopathy, with long-term current use of insulin, macular edema presence unspecified, unspecified laterality, unspecified retinopathy severity (HCC) A1c up due to difficulty getting Soliqua refilled in timely manner. Will check with pharmacy to try to resume 3 months supplies with refills.   2. Tendonitis of both elbows Recommend OTC diclofenac gel.   3. Need for influenza vaccination  - Flu Vaccine QUAD 6+ mos PF IM (Fluarix Quad PF)   Future Appointments  Date Time Provider [place]  07/26/2022  9:40 AM Caryn Section, Kirstie Peri, MD [facility]        The entirety of the information documented in the History of Present Illness, Review of Systems and Physical Exam were personally obtained by me. Portions of this information were initially  documented by the CMA and reviewed by me for thoroughness and accuracy.     Lelon Huh, MD  [facility] [phone] (phone) [phone] (fax)  [place]

## 2022-03-22 NOTE — Patient Instructions (Signed)
Please review the attached list of medications and notify my office if there are any errors.   Try diclofenac 1% gel on your elbows and forearms for tendonitis.

## 2022-03-31 ENCOUNTER — Other Ambulatory Visit: Payer: Self-pay | Admitting: Family Medicine

## 2022-03-31 DIAGNOSIS — Z794 Long term (current) use of insulin: Secondary | ICD-10-CM

## 2022-03-31 MED ORDER — SOLIQUA 100-33 UNT-MCG/ML ~~LOC~~ SOPN: PEN_INJECTOR | SUBCUTANEOUS | 3 refills | Status: DC

## 2022-04-12 LAB — HM DIABETES EYE EXAM

## 2022-04-14 ENCOUNTER — Encounter: Payer: Self-pay | Admitting: Family Medicine

## 2022-05-08 ENCOUNTER — Other Ambulatory Visit: Payer: Self-pay | Admitting: Family Medicine

## 2022-05-14 ENCOUNTER — Other Ambulatory Visit: Payer: Self-pay | Admitting: Family Medicine

## 2022-05-16 NOTE — Telephone Encounter (Signed)
Requested Prescriptions  Pending Prescriptions Disp Refills   montelukast (SINGULAIR) 10 MG tablet [Pharmacy Med Name: Montelukast Sodium 10 MG Oral Tablet] 90 tablet 0    Sig: TAKE 1 TABLET BY MOUTH AT  BEDTIME     Pulmonology:  Leukotriene Inhibitors Passed - 05/14/2022  4:58 AM      Passed - Valid encounter within last 12 months    Recent Outpatient Visits           1 month ago Need for influenza vaccination   [facility] Birdie Sons, MD   5 months ago Annual physical exam   [facility] Birdie Sons, MD   9 months ago Type 2 diabetes mellitus with hyperglycemia, with long-term current use of insulin [facility])   [facility] Birdie Sons, MD   10 months ago Chronic cough   [place], DO   11 months ago Upper respiratory tract infection, unspecified type   [facility] Birdie Sons, MD       Future Appointments             In 2 months Fisher, Kirstie Peri, MD [facility], [place]

## 2022-07-26 ENCOUNTER — Ambulatory Visit: Payer: Managed Care, Other (non HMO) | Admitting: Family Medicine

## 2022-08-03 ENCOUNTER — Other Ambulatory Visit: Payer: Self-pay | Admitting: Family Medicine

## 2022-08-04 NOTE — Telephone Encounter (Signed)
Unable to refill per protocol, Rx request is too soon. Last refill 07/22/21 for 90 and 4 refills.  Requested Prescriptions  Pending Prescriptions Disp Refills   diltiazem (TIAZAC) 180 MG 24 hr capsule [Pharmacy Med Name: DILTIAZEM  180MG   CAP  24 HR TR] 90 capsule 3    Sig: TAKE 1 CAPSULE BY MOUTH DAILY     Cardiovascular: Calcium Channel Blockers 3 Failed - 08/03/2022 10:44 PM      Failed - Cr in normal range and within 360 days    Creatinine, Ser  Date Value Ref Range Status  11/21/2021 1.36 (H) 0.76 - 1.27 mg/dL Final         Passed - ALT in normal range and within 360 days    ALT  Date Value Ref Range Status  11/21/2021 26 0 - 44 IU/L Final         Passed - AST in normal range and within 360 days    AST  Date Value Ref Range Status  11/21/2021 27 0 - 40 IU/L Final         Passed - Last BP in normal range    BP Readings from Last 1 Encounters:  03/22/22 133/69         Passed - Last Heart Rate in normal range    Pulse Readings from Last 1 Encounters:  03/22/22 96         Passed - Valid encounter within last 6 months    Recent Outpatient Visits           4 months ago Need for influenza vaccination   [facility] Birdie Sons, MD   8 months ago Annual physical exam   [facility] Birdie Sons, MD   12 months ago Type 2 diabetes mellitus with hyperglycemia, with long-term current use of insulin [facility])   [place], Donald E, MD   1 year ago Chronic cough   [place], DO   1 year ago Upper respiratory tract infection, unspecified type   [place], [place], MD       Future Appointments             In 1 week Fisher, Kirstie Peri, MD [facility], [facility]

## 2022-08-11 ENCOUNTER — Ambulatory Visit: Payer: Managed Care, Other (non HMO) | Admitting: Family Medicine

## 2022-08-11 ENCOUNTER — Encounter: Payer: Self-pay | Admitting: Family Medicine

## 2022-08-11 VITALS — BP 113/68 | HR 95 | Wt 180.9 lb

## 2022-08-11 DIAGNOSIS — Z794 Long term (current) use of insulin: Secondary | ICD-10-CM | POA: Diagnosis not present

## 2022-08-11 DIAGNOSIS — I10 Essential (primary) hypertension: Secondary | ICD-10-CM | POA: Diagnosis not present

## 2022-08-11 DIAGNOSIS — E11319 Type 2 diabetes mellitus with unspecified diabetic retinopathy without macular edema: Secondary | ICD-10-CM

## 2022-08-11 DIAGNOSIS — Z89611 Acquired absence of right leg above knee: Secondary | ICD-10-CM

## 2022-08-11 LAB — POCT GLYCOSYLATED HEMOGLOBIN (HGB A1C)
Est. average glucose Bld gHb Est-mCnc: 157
Hemoglobin A1C: 7.1 % — AB (ref 4.0–5.6)

## 2022-08-11 MED ORDER — TADALAFIL 20 MG PO TABS: 10.0000 mg | ORAL_TABLET | ORAL | 11 refills | Status: AC | PRN

## 2022-08-11 NOTE — Progress Notes (Signed)
I,Sha'taria Tyson,acting as a Education administrator for Lelon Huh, MD.,have documented all relevant documentation on the behalf of Lelon Huh, MD,as directed by  Lelon Huh, MD while in the presence of Lelon Huh, MD.   Established patient visit   Patient: Robert [name]   DOB: [birth year]   61 y.o. Male  MRN: [id number] Visit Date: 08/11/2022  Today's healthcare provider: Lelon Huh, MD    Subjective    HPI  Diabetes Mellitus Type II, Follow-up  Lab Results  Component Value Date   HGBA1C 8.0 (A) 03/22/2022   HGBA1C 7.4 (H) 11/21/2021   HGBA1C 6.4 (A) 08/05/2021   Wt Readings from Last 3 Encounters:  03/22/22 182 lb (82.6 kg)  11/18/21 181 lb (82.1 kg)  08/05/21 177 lb (80.3 kg)   Last seen for diabetes 4 months ago.  Management since then includes continue current treatment. He reports excellent compliance with treatment. He is not having side effects.  Symptoms: No fatigue No foot ulcerations  No appetite changes No nausea  No paresthesia of the feet  No polydipsia  No polyuria No visual disturbances   No vomiting     Home blood sugar records:  not being checked  Episodes of hypoglycemia? Yes occasional episodes of feeling drowsy or dizzy   Current insulin regiment: Soliqua 60 a day.  Most Recent Eye Exam: 04/12/22 Pertinent Labs: Lab Results  Component Value Date   CHOL 122 11/21/2021   HDL 49 11/21/2021   LDLCALC 61 11/21/2021   TRIG 54 11/21/2021   CHOLHDL 2.5 11/21/2021   Lab Results  Component Value Date   NA 142 11/21/2021   K 4.7 11/21/2021   CREATININE 1.36 (H) 11/21/2021   EGFR 60 11/21/2021   LABMICR 10.7 08/05/2021   MICRALBCREAT 10 08/05/2021     ---------------------------------------------------------------------------------------------------   Medications: Outpatient Medications Prior to Visit  Medication Sig   aspirin EC 81 MG tablet Take 81 mg by mouth daily.   B-D UF III MINI PEN NEEDLES 31G X 5 MM MISC USE 4 TIMES DAILY    baclofen (LIORESAL) 10 MG tablet Take 1 tablet (10 mg total) by mouth 3 (three) times daily.   Blood Glucose Monitoring Suppl (ACCU-CHEK AVIVA PLUS) w/Device KIT Use to check blood sugar three times daily for insulin dependent diabetes (E11.9)   diltiazem (TIAZAC) 180 MG 24 hr capsule TAKE 1 CAPSULE BY MOUTH  DAILY   fluticasone (FLONASE) 50 MCG/ACT nasal spray Place 2 sprays into both nostrils daily.   glucose blood test strip Use as instructed to check sugar three times daily for insulin dependent diabets.   hydrOXYzine (ATARAX) 25 MG tablet TAKE 1 TABLET (25 MG TOTAL) BY MOUTH EVERY DAY AT BEDTIME AS NEEDED FOR INSOMNIA   ibuprofen (ADVIL) 800 MG tablet TAKE 1 TABLET BY MOUTH  TWICE DAILY AS NEEDED   Insulin Glargine-Lixisenatide (SOLIQUA) 100-33 UNT-MCG/ML SOPN INJECT SUBCUTANEOUSLY 60 UNITS  AS DIRECTED DAILY   JARDIANCE 10 MG TABS tablet TAKE 1 TABLET BY MOUTH  DAILY   Lancets (ONETOUCH ULTRASOFT) lancets Use as instructed   lidocaine (LIDODERM) 5 % Place 1 patch onto the skin daily. Remove & Discard patch within 12 hours or as directed by MD   losartan-hydrochlorothiazide (HYZAAR) 50-12.5 MG tablet TAKE 1 TABLET BY MOUTH  DAILY   metFORMIN (GLUCOPHAGE) 1000 MG tablet TAKE 1 TABLET BY MOUTH  DAILY   montelukast (SINGULAIR) 10 MG tablet TAKE 1 TABLET BY MOUTH AT  BEDTIME   omeprazole (PRILOSEC) 20 MG  capsule Take 1 capsule (20 mg total) by mouth daily.   pravastatin (PRAVACHOL) 40 MG tablet TAKE 1 TABLET BY MOUTH DAILY   tadalafil (CIALIS) 20 MG tablet Take 0.5-1 tablets (10-20 mg total) by mouth every other day as needed for erectile dysfunction.   traMADol (ULTRAM) 50 MG tablet Take 1 tablet (50 mg total) by mouth every 8 (eight) hours as needed.   valACYclovir (VALTREX) 1000 MG tablet Take 1 tablet (1,000 mg total) by mouth 3 (three) times daily.   No facility-administered medications prior to visit.    Review of Systems  Constitutional:  Negative for appetite change, chills and  fever.  Respiratory:  Negative for chest tightness, shortness of breath and wheezing.   Cardiovascular:  Negative for chest pain and palpitations.  Gastrointestinal:  Negative for abdominal pain, nausea and vomiting.       Objective    BP 113/68 (BP Location: Right Arm, Patient Position: Sitting, Cuff Size: Large)   Pulse 95   Wt 180 lb 14.4 oz (82.1 kg)   SpO2 100%   BMI 30.10 kg/m    Physical Exam  General appearance: Mildly obese male, cooperative and in no acute distress Head: Normocephalic, without obvious abnormality, atraumatic Respiratory: Respirations even and unlabored, normal respiratory rate Extremities: Above knee amputation of right lower extremity is noted.  Skin: Skin color, texture, turgor normal. No rashes seen  Psych: Appropriate mood and affect. Neurologic: Mental status: Alert, oriented to person, place, and time, thought content appropriate.   Results for orders placed or performed in visit on 08/11/22  POCT HgB A1C  Result Value Ref Range   Hemoglobin A1C 7.1 (A) 4.0 - 5.6 %   Est. average glucose Bld gHb Est-mCnc 157     Assessment & Plan     1. Type 2 diabetes mellitus with retinopathy, with long-term current use of insulin, macular edema presence unspecified, unspecified laterality, unspecified retinopathy severity ([place]) Well controlled.  Continue current medications.  Consider reducing Soliqua if A1c continues to drop.   2. Primary hypertension Well controlled.  Continue current medications.  Consider reducing diltiazem if BP continues to drop.   3. 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 ED - tadalafil (CIALIS) 20 MG tablet; Take 0.5-1 tablets (10-20 mg total) by mouth every other day as needed for erectile dysfunction.  Dispense: 10 tablet; Refill: 11      The entirety of the information documented in the History of Present Illness, Review of Systems and Physical Exam were personally obtained by me. Portions of this  information were initially documented by the CMA and reviewed by me for thoroughness and accuracy.     Lelon Huh, MD  [place] [phone] (phone) [phone] (fax)  [place]

## 2022-08-11 NOTE — Patient Instructions (Signed)
.   Please review the attached list of medications and notify my office if there are any errors.   . Please bring all of your medications to every appointment so we can make sure that our medication list is the same as yours.   

## 2022-08-30 ENCOUNTER — Other Ambulatory Visit: Payer: Self-pay | Admitting: Family Medicine

## 2022-09-06 ENCOUNTER — Other Ambulatory Visit: Payer: Self-pay | Admitting: Family Medicine

## 2022-09-06 DIAGNOSIS — F5101 Primary insomnia: Secondary | ICD-10-CM

## 2022-09-18 ENCOUNTER — Telehealth: Payer: Self-pay

## 2022-09-18 NOTE — Telephone Encounter (Signed)
Pt called in checking status of prothesis has been faxed to [facility]

## 2022-09-18 NOTE — Telephone Encounter (Signed)
Copied from CRM [id number]. Topic: General - Other >> Sep 18, 2022  8:17 AM Everette C wrote: Reason for CRM: The patient would like to have orders submitted for a new prosthesis for their right leg submitted to [facility]   Please fax to [phone] Attn Amy Woznicki  Please contact the patient further if needed

## 2022-09-25 ENCOUNTER — Ambulatory Visit: Payer: Managed Care, Other (non HMO) | Admitting: Family Medicine

## 2022-09-25 VITALS — BP 134/67 | HR 90 | Ht 65.0 in | Wt 179.1 lb

## 2022-09-25 DIAGNOSIS — Z89611 Acquired absence of right leg above knee: Secondary | ICD-10-CM

## 2022-09-25 NOTE — Patient Instructions (Signed)
.   Please review the attached list of medications and notify my office if there are any errors.   . Please bring all of your medications to every appointment so we can make sure that our medication list is the same as yours.   
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
## 2022-09-29 ENCOUNTER — Other Ambulatory Visit: Payer: Self-pay | Admitting: Family Medicine

## 2022-09-29 DIAGNOSIS — E1165 Type 2 diabetes mellitus with hyperglycemia: Secondary | ICD-10-CM

## 2022-10-01 ENCOUNTER — Other Ambulatory Visit: Payer: Self-pay | Admitting: Family Medicine

## 2022-10-01 DIAGNOSIS — I10 Essential (primary) hypertension: Secondary | ICD-10-CM

## 2022-10-02 NOTE — Telephone Encounter (Signed)
Requested Prescriptions  Pending Prescriptions Disp Refills   SOLIQUA 100-33 UNT-MCG/ML SOPN [Pharmacy Med Name: Lawana Chambers 100-33 UNT-MCG/ML Subcutaneous Solution Pen-injector] 45 mL 3    Sig: INJECT SUBCUTANEOUSLY 60 UNITS  AS DIRECTED DAILY     Endocrinology: Diabetes - Insulin + GLP-1 Receptor Agonist Combos 2 Failed - 09/29/2022 10:26 PM      Failed - Cr in normal range and within 360 days    Creatinine, Ser  Date Value Ref Range Status  11/21/2021 1.36 (H) 0.76 - 1.27 mg/dL Final         Passed - HBA1C is between 0 and 7.9 and within 180 days    Hemoglobin A1C  Date Value Ref Range Status  08/11/2022 7.1 (A) 4.0 - 5.6 % Final   Hgb A1c MFr Bld  Date Value Ref Range Status  11/21/2021 7.4 (H) 4.8 - 5.6 % Final    Comment:             Prediabetes: 5.7 - 6.4          Diabetes: >6.4          Glycemic control for adults with diabetes: <7.0          Passed - eGFR in normal range and within 360 days    GFR calc Af Amer  Date Value Ref Range Status  10/23/2019 62 >59 mL/min/1.73 Final    Comment:    **[facility] currently reports eGFR in compliance with the current**   recommendations of the [employer]. [facility] will   update reporting as new guidelines are published from the NKF-ASN   Task force.    GFR calc non Af Amer  Date Value Ref Range Status  10/23/2019 54 (L) >59 mL/min/1.73 Final   eGFR  Date Value Ref Range Status  11/21/2021 60 >59 mL/min/1.73 Final         Passed - Valid encounter within last 6 months    Recent Outpatient Visits           1 week ago    [facility] Malva Limes, MD   1 month ago Type 2 diabetes mellitus with retinopathy, with long-term current use of insulin, macular edema presence unspecified, unspecified laterality, unspecified retinopathy severity (HCC)   [facility] Malva Limes, MD   6 months ago Need for influenza vaccination   [facility] Malva Limes, MD   10 months ago Annual physical exam   [facility] Malva Limes, MD   1 year ago Type 2 diabetes mellitus with hyperglycemia, with long-term current use of insulin [facility])   [facility] Malva Limes, MD       Future Appointments             In 2 months Fisher, Demetrios Isaacs, MD [facility], [facility]

## 2022-10-03 NOTE — Telephone Encounter (Signed)
Requested Prescriptions  Refused Prescriptions Disp Refills   losartan-hydrochlorothiazide (HYZAAR) 50-12.5 MG tablet [Pharmacy Med Name: Losartan Potassium-HCTZ 50-12.5 MG Oral Tablet] 90 tablet 3    Sig: TAKE 1 TABLET BY MOUTH DAILY     Cardiovascular: ARB + Diuretic Combos Failed - 10/01/2022 10:31 PM      Failed - K in normal range and within 180 days    Potassium  Date Value Ref Range Status  11/21/2021 4.7 3.5 - 5.2 mmol/L Final         Failed - Na in normal range and within 180 days    Sodium  Date Value Ref Range Status  11/21/2021 142 134 - 144 mmol/L Final         Failed - Cr in normal range and within 180 days    Creatinine, Ser  Date Value Ref Range Status  11/21/2021 1.36 (H) 0.76 - 1.27 mg/dL Final         Failed - eGFR is 10 or above and within 180 days    GFR calc Af Amer  Date Value Ref Range Status  10/23/2019 62 >59 mL/min/1.73 Final    Comment:    **[facility] currently reports eGFR in compliance with the current**   recommendations of the [employer]. [facility] will   update reporting as new guidelines are published from the NKF-ASN   Task force.    GFR calc non Af Amer  Date Value Ref Range Status  10/23/2019 54 (L) >59 mL/min/1.73 Final   eGFR  Date Value Ref Range Status  11/21/2021 60 >59 mL/min/1.73 Final         Passed - Patient is not pregnant      Passed - Last BP in normal range    BP Readings from Last 1 Encounters:  09/25/22 134/67         Passed - Valid encounter within last 6 months    Recent Outpatient Visits           1 week ago    [facility] Malva Limes, MD   1 month ago Type 2 diabetes mellitus with retinopathy, with long-term current use of insulin, macular edema presence unspecified, unspecified laterality, unspecified retinopathy severity (HCC)   [facility] Malva Limes, MD   6 months ago Need for influenza vaccination   [facility] Malva Limes, MD   10 months ago Annual physical exam   [facility] Malva Limes, MD   1 year ago Type 2 diabetes mellitus with hyperglycemia, with long-term current use of insulin (HCC)   [place] [facility] Malva Limes, MD       Future Appointments             In 2 months Fisher, Demetrios Isaacs, MD [facility], [facility]

## 2022-10-11 ENCOUNTER — Other Ambulatory Visit: Payer: Self-pay | Admitting: Family Medicine

## 2022-10-11 DIAGNOSIS — F5101 Primary insomnia: Secondary | ICD-10-CM

## 2022-11-13 ENCOUNTER — Other Ambulatory Visit: Payer: Self-pay | Admitting: Family Medicine

## 2022-11-13 DIAGNOSIS — I10 Essential (primary) hypertension: Secondary | ICD-10-CM

## 2022-11-13 MED ORDER — LOSARTAN POTASSIUM-HCTZ 50-12.5 MG PO TABS: 1.0000 | ORAL_TABLET | Freq: Every day | ORAL | 4 refills | Status: DC

## 2022-12-04 ENCOUNTER — Ambulatory Visit: Payer: Managed Care, Other (non HMO) | Admitting: Family Medicine

## 2022-12-04 ENCOUNTER — Encounter: Payer: Self-pay | Admitting: Family Medicine

## 2022-12-04 ENCOUNTER — Other Ambulatory Visit: Payer: Self-pay | Admitting: Family Medicine

## 2022-12-04 VITALS — BP 126/61 | HR 66 | Temp 98.3°F | Resp 12 | Ht 65.0 in | Wt 179.1 lb

## 2022-12-04 DIAGNOSIS — E1121 Type 2 diabetes mellitus with diabetic nephropathy: Secondary | ICD-10-CM

## 2022-12-04 DIAGNOSIS — I10 Essential (primary) hypertension: Secondary | ICD-10-CM

## 2022-12-04 DIAGNOSIS — Z89611 Acquired absence of right leg above knee: Secondary | ICD-10-CM

## 2022-12-04 DIAGNOSIS — E11319 Type 2 diabetes mellitus with unspecified diabetic retinopathy without macular edema: Secondary | ICD-10-CM | POA: Diagnosis not present

## 2022-12-04 DIAGNOSIS — Z125 Encounter for screening for malignant neoplasm of prostate: Secondary | ICD-10-CM | POA: Diagnosis not present

## 2022-12-04 DIAGNOSIS — Z794 Long term (current) use of insulin: Secondary | ICD-10-CM

## 2022-12-04 DIAGNOSIS — R809 Proteinuria, unspecified: Secondary | ICD-10-CM

## 2022-12-04 NOTE — Progress Notes (Signed)
Established patient visit   Patient: Robert [name]   DOB: 14-Dec-1960   62 y.o. Male  MRN: [id number] Visit Date: 12/04/2022  Today's healthcare provider: Mila Merry, MD    Subjective    HPI Patient presents for follow up diabetes with nephropathy, lipids, and hypertension. He reports he is doing well on current medications, no hypoglycemia. Doesn't check sugars consistently.  Lab Results  Component Value Date   HGBA1C 7.1 (A) 08/11/2022   HGBA1C 8.0 (A) 03/22/2022   HGBA1C 7.4 (H) 11/21/2021   Lab Results  Component Value Date   CHOL 122 11/21/2021   HDL 49 11/21/2021   LDLCALC 61 11/21/2021   TRIG 54 11/21/2021   CHOLHDL 2.5 11/21/2021   Lab Results  Component Value Date   NA 142 11/21/2021   K 4.7 11/21/2021   CREATININE 1.36 (H) 11/21/2021   EGFR 60 11/21/2021   GLUCOSE 107 (H) 11/21/2021   He also reports he is in process of getting new electronic prosthesis for right AKA and needs order sent [facility].   Medications: Outpatient Medications Prior to Visit  Medication Sig   aspirin EC 81 MG tablet Take 81 mg by mouth daily.   B-D UF III MINI PEN NEEDLES 31G X 5 MM MISC USE 4 TIMES DAILY   baclofen (LIORESAL) 10 MG tablet Take 1 tablet (10 mg total) by mouth 3 (three) times daily.   Blood Glucose Monitoring Suppl (ACCU-CHEK AVIVA PLUS) w/Device KIT Use to check blood sugar three times daily for insulin dependent diabetes (E11.9)   diltiazem (TIAZAC) 180 MG 24 hr capsule TAKE 1 CAPSULE BY MOUTH  DAILY   fluticasone (FLONASE) 50 MCG/ACT nasal spray Place 2 sprays into both nostrils daily.   glucose blood test strip Use as instructed to check sugar three times daily for insulin dependent diabets.   hydrOXYzine (ATARAX) 25 MG tablet TAKE 1 TABLET (25 MG TOTAL) BY MOUTH EVERY DAY AT BEDTIME AS NEEDED FOR INSOMNIA   ibuprofen (ADVIL) 800 MG tablet TAKE 1 TABLET BY MOUTH  TWICE DAILY AS NEEDED   JARDIANCE 10 MG TABS tablet TAKE 1 TABLET BY MOUTH  DAILY    Lancets (ONETOUCH ULTRASOFT) lancets Use as instructed   lidocaine (LIDODERM) 5 % Place 1 patch onto the skin daily. Remove & Discard patch within 12 hours or as directed by MD   losartan-hydrochlorothiazide (HYZAAR) 50-12.5 MG tablet Take 1 tablet by mouth daily.   metFORMIN (GLUCOPHAGE) 1000 MG tablet TAKE 1 TABLET BY MOUTH  DAILY   montelukast (SINGULAIR) 10 MG tablet TAKE 1 TABLET BY MOUTH AT  BEDTIME   omeprazole (PRILOSEC) 20 MG capsule Take 1 capsule (20 mg total) by mouth daily.   pravastatin (PRAVACHOL) 40 MG tablet TAKE 1 TABLET BY MOUTH DAILY   SOLIQUA 100-33 UNT-MCG/ML SOPN INJECT SUBCUTANEOUSLY 60 UNITS  AS DIRECTED DAILY   tadalafil (CIALIS) 20 MG tablet Take 0.5-1 tablets (10-20 mg total) by mouth every other day as needed for erectile dysfunction.   traMADol (ULTRAM) 50 MG tablet Take 1 tablet (50 mg total) by mouth every 8 (eight) hours as needed.   valACYclovir (VALTREX) 1000 MG tablet Take 1 tablet (1,000 mg total) by mouth 3 (three) times daily.   No facility-administered medications prior to visit.      Objective    BP 126/61 (BP Location: Left Arm, Patient Position: Bed low/side rails up, Cuff Size: Small)   Pulse 66   Temp 98.3 F (36.8 C) (  Temporal)   Resp 12   Ht 5\' 5"  (1.651 m)   Wt 179 lb 1.6 oz (81.2 kg)   SpO2 99%   BMI 29.80 kg/m    Physical Exam  General appearance: Well developed, well nourished male, cooperative and in no acute distress Head: Normocephalic, without obvious abnormality, atraumatic Respiratory: Respirations even and unlabored, normal respiratory rate Extremities: Above knee amputation of right lower extremity is noted.  Skin: Skin color, texture, turgor normal. No rashes seen  Psych: Appropriate mood and affect. Neurologic: Mental status: Alert, oriented to person, place, and time, thought content appropriate.     Assessment & Plan     1. Type 2 diabetes mellitus with retinopathy, with long-term current use of insulin, macular  edema presence unspecified, unspecified laterality, unspecified retinopathy severity (HCC) Doing well on current medications.  - Hemoglobin A1c - Comprehensive metabolic panel with eGFR (no eGFR if sent to [facility]) [LAB17] - Direct LDL  2. Diabetic nephropathy associated with type 2 diabetes mellitus (HCC)  - Urine Albumin/Creatinine with ratio (send out) [LAB689]  3. Primary hypertension Well controlled.  Continue current medications.   - CBC - Lipid panel - TSH  4. Prostate cancer screening  - PSA  5. 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        Mila Merry, MD  [facility] [phone] (phone) [phone] (fax)  [facility]

## 2022-12-05 LAB — LIPID PANEL
Chol/HDL Ratio: 2.3 ratio (ref 0.0–5.0)
Cholesterol, Total: 114 mg/dL (ref 100–199)
HDL: 49 mg/dL (ref >39)
LDL Chol Calc (NIH): 53 mg/dL (ref 0–99)
Triglycerides: 53 mg/dL (ref 0–149)
VLDL Cholesterol Cal: 12 mg/dL (ref 5–40)

## 2022-12-05 LAB — TSH: TSH: 0.557 u[IU]/mL (ref 0.450–4.500)

## 2022-12-05 LAB — CBC
Hematocrit: 43.7 % (ref 37.5–51.0)
Hemoglobin: 14 g/dL (ref 13.0–17.7)
MCH: 26.1 pg — ABNORMAL LOW (ref 26.6–33.0)
MCHC: 32 g/dL (ref 31.5–35.7)
MCV: 81 fL (ref 79–97)
Platelets: 292 10*3/uL (ref 150–450)
RBC: 5.37 x10E6/uL (ref 4.14–5.80)
RDW: 13.6 % (ref 11.6–15.4)
WBC: 4.9 10*3/uL (ref 3.4–10.8)

## 2022-12-05 LAB — COMPREHENSIVE METABOLIC PANEL
ALT: 20 IU/L (ref 0–44)
AST: 17 IU/L (ref 0–40)
Albumin: 4.4 g/dL (ref 3.8–4.9)
Alkaline Phosphatase: 105 IU/L (ref 44–121)
BUN/Creatinine Ratio: 13 (ref 10–24)
BUN: 21 mg/dL (ref 8–27)
Bilirubin Total: 0.3 mg/dL (ref 0.0–1.2)
CO2: 24 mmol/L (ref 20–29)
Calcium: 9.8 mg/dL (ref 8.6–10.2)
Chloride: 103 mmol/L (ref 96–106)
Creatinine, Ser: 1.68 mg/dL — ABNORMAL HIGH (ref 0.76–1.27)
Globulin, Total: 2.2 g/dL (ref 1.5–4.5)
Glucose: 75 mg/dL (ref 70–99)
Potassium: 4.3 mmol/L (ref 3.5–5.2)
Sodium: 142 mmol/L (ref 134–144)
Total Protein: 6.6 g/dL (ref 6.0–8.5)
eGFR: 46 mL/min/{1.73_m2} — ABNORMAL LOW (ref >59)

## 2022-12-05 LAB — MICROALBUMIN / CREATININE URINE RATIO
Creatinine, Urine: 158.5 mg/dL
Microalb/Creat Ratio: 7 mg/g creat (ref 0–29)
Microalbumin, Urine: 10.5 ug/mL

## 2022-12-05 LAB — PSA: Prostate Specific Ag, Serum: 0.5 ng/mL (ref 0.0–4.0)

## 2022-12-05 LAB — HEMOGLOBIN A1C
Est. average glucose Bld gHb Est-mCnc: 160 mg/dL
Hgb A1c MFr Bld: 7.2 % — ABNORMAL HIGH (ref 4.8–5.6)

## 2022-12-05 LAB — LDL CHOLESTEROL, DIRECT: LDL Direct: 55 mg/dL (ref 0–99)

## 2022-12-07 ENCOUNTER — Telehealth: Payer: Self-pay | Admitting: Family Medicine

## 2022-12-07 NOTE — Telephone Encounter (Signed)
Patient was here Monday and stated that [facility] in [place] needed an order regarding prosthetic leg, but did not know details of the order they needed. I haven't seen anything from [facility]. Can you please call [facility] and see what exactly they need an order for.

## 2022-12-07 NOTE — Telephone Encounter (Signed)
[facility] reports that they are waiting on patient to talk to [employer] at his work place. As his insurance does not cover what he is requesting. Patient is requesting Micro[employer] knee. They have written order from you dated 09/25/22. Patient will fu with them 12/12/22. At that time they may change order due to insurance. They will fax over any paper work needed at that time.

## 2022-12-25 ENCOUNTER — Ambulatory Visit: Payer: Self-pay

## 2022-12-25 NOTE — Telephone Encounter (Signed)
  Chief Complaint: Hand pain Symptoms: Feels like an electric shock in right hand Frequency: 1 month getting worse Pertinent Negatives: Patient denies  Disposition: [] ED /[] Urgent Care (no appt availability in office) / [x] Appointment(In office/virtual)/ []  [place] [facility]/ [] Home Care/ [] Refused Recommended Disposition /[] [place] [facility]/ []  Follow-up with PCP Additional Notes: Pt states that pain in his hand has been there about 1 month. Pain is increasing. Spoke with provider at last ov, provider thought it might be tendonitis.     Reason for Disposition  [1] MODERATE pain (e.g., interferes with normal activities) AND [2] present > 3 days  Answer Assessment - Initial Assessment Questions 1. ONSET: "When did the pain start?"     1 month 2. LOCATION: "Where is the pain located?"     RIght 3. PAIN: "How bad is the pain?" (Scale 1-10; or mild, moderate, severe)   - MILD (1-3): doesn't interfere with normal activities   - MODERATE (4-7): interferes with normal activities (e.g., work or school) or awakens from sleep   - SEVERE (8-10): excruciating pain, unable to use hand at all     Feels like an electric shock 4. WORK OR EXERCISE: "Has there been any recent work or exercise that involved this part (i.e., hand or wrist) of the body?"     no 5. CAUSE: "What do you think is causing the pain?"     tendonitis 6. AGGRAVATING FACTORS: "What makes the pain worse?" (e.g., using computer)     no 7. OTHER SYMPTOMS: "Do you have any other symptoms?" (e.g., neck pain, swelling, rash, numbness, fever)     Numbness of fingers  Protocols used: Hand and Wrist Pain-A-AH

## 2022-12-26 ENCOUNTER — Ambulatory Visit: Payer: Managed Care, Other (non HMO) | Admitting: Family Medicine

## 2022-12-26 VITALS — BP 119/65 | HR 71 | Temp 98.2°F | Ht 65.0 in | Wt 178.0 lb

## 2022-12-26 DIAGNOSIS — M25542 Pain in joints of left hand: Secondary | ICD-10-CM

## 2022-12-26 DIAGNOSIS — M25541 Pain in joints of right hand: Secondary | ICD-10-CM

## 2022-12-26 MED ORDER — NAPROXEN 500 MG PO TABS
500.0000 mg | ORAL_TABLET | Freq: Two times a day (BID) | ORAL | 1 refills | Status: DC
Start: 2022-12-26 — End: 2023-02-19

## 2022-12-26 NOTE — Progress Notes (Signed)
Established patient visit   Patient: Robert [name]   DOB: Apr 21, 1961   62 y.o. Male  MRN: [id number] Visit Date: 12/26/2022  Today's healthcare provider: Mila Merry, MD   Chief Complaint  Patient presents with   Hand Pain    Pain is bilateral but worse on the right.  Patient is right handed.  No known trauma to either hand.  Patient has been experiencing this on and off for about 1 month.  It is getting progressively worse.  He does not do repetitive work with his hands.  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  He has been using Ibuprofen without relief.  No associated factors that make it worse.  He denies radiation of pain, neck pain or arm pain.  He does report some hand numbness also.   Subjective    Discussed the use of AI scribe software for clinical note transcription with the patient, who gave verbal consent to proceed.  History of Present Illness   The patient presents with a month-long history of hand discomfort, predominantly in the right hand. He describes the pain as an aching sensation, with the right hand being more severely affected. The discomfort has been intermittent but has recently worsened. The patient denies any associated joint problems in the wrists, elbows, or shoulders.  The patient has been on a daily regimen of prescription-strength ibuprofen (800mg ) for several years, initially prescribed for unspecified pain. However, he reports that the ibuprofen does not alleviate the hand discomfort. The patient's occupation involves significant manual labor, including turning wrenches and pulling, which may contribute to the hand discomfort.  In addition to the pain, he reports numbness in the fingertips, particularly when exposed to cold temperatures. He denies any noticeable swelling in the hands. The patient describes a sensation akin to 'needles' when attempting to grasp objects, leading to difficulty in holding onto items.  The patient has a past  medical intervention of a cortisone injection in the wrist for pain, the details of which are not fully elaborated. He is currently not experiencing any wrist discomfort, with the pain localized to the fingers and knuckles.       Medications: Outpatient Medications Prior to Visit  Medication Sig   aspirin EC 81 MG tablet Take 81 mg by mouth daily.   B-D UF III MINI PEN NEEDLES 31G X 5 MM MISC USE 4 TIMES DAILY   baclofen (LIORESAL) 10 MG tablet Take 1 tablet (10 mg total) by mouth 3 (three) times daily.   Blood Glucose Monitoring Suppl (ACCU-CHEK AVIVA PLUS) w/Device KIT Use to check blood sugar three times daily for insulin dependent diabetes (E11.9)   diltiazem (TIAZAC) 180 MG 24 hr capsule TAKE 1 CAPSULE BY MOUTH  DAILY   fluticasone (FLONASE) 50 MCG/ACT nasal spray Place 2 sprays into both nostrils daily.   glucose blood test strip Use as instructed to check sugar three times daily for insulin dependent diabets.   hydrOXYzine (ATARAX) 25 MG tablet TAKE 1 TABLET (25 MG TOTAL) BY MOUTH EVERY DAY AT BEDTIME AS NEEDED FOR INSOMNIA   JARDIANCE 10 MG TABS tablet TAKE 1 TABLET BY MOUTH  DAILY   Lancets (ONETOUCH ULTRASOFT) lancets Use as instructed   lidocaine (LIDODERM) 5 % Place 1 patch onto the skin daily. Remove & Discard patch within 12 hours or as directed by MD   losartan-hydrochlorothiazide (HYZAAR) 50-12.5 MG tablet Take 1 tablet by mouth daily.   metFORMIN (GLUCOPHAGE) 1000 MG tablet  TAKE 1 TABLET BY MOUTH  DAILY   montelukast (SINGULAIR) 10 MG tablet TAKE 1 TABLET BY MOUTH AT  BEDTIME   omeprazole (PRILOSEC) 20 MG capsule Take 1 capsule (20 mg total) by mouth daily.   pravastatin (PRAVACHOL) 40 MG tablet TAKE 1 TABLET BY MOUTH DAILY   SOLIQUA 100-33 UNT-MCG/ML SOPN INJECT SUBCUTANEOUSLY 60 UNITS  AS DIRECTED DAILY   tadalafil (CIALIS) 20 MG tablet Take 0.5-1 tablets (10-20 mg total) by mouth every other day as needed for erectile dysfunction.   traMADol (ULTRAM) 50 MG tablet Take 1  tablet (50 mg total) by mouth every 8 (eight) hours as needed.   valACYclovir (VALTREX) 1000 MG tablet Take 1 tablet (1,000 mg total) by mouth 3 (three) times daily.   [DISCONTINUED] ibuprofen (ADVIL) 800 MG tablet TAKE 1 TABLET BY MOUTH TWICE  DAILY AS NEEDED   No facility-administered medications prior to visit.   Review of Systems     Objective    BP 119/65 (BP Location: Left Arm, Patient Position: Sitting, Cuff Size: Large)   Pulse 71   Temp 98.2 F (36.8 C) (Oral)   Ht 5\' 5"  (1.651 m)   Wt 178 lb (80.7 kg)   SpO2 100%   BMI 29.62 kg/m   Physical Exam  General stiffness of bilateral MCPs and Ips. Slightly tender, no swelling. FROM of all Ips.    Assessment & Plan     Assessment and Plan    Hand Pain Chronic hand pain, worse in the right hand, with no other joint involvement. No relief with long-term use of ibuprofen 800mg  daily. No visible swelling. 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 Possible inflammatory arthritis. -Order blood tests to rule out inflammatory arthritis (rheumatoid, lupus arthritis). -Discontinue ibuprofen. -Start Naprosyn, one in the morning and one in the evening. -If blood tests indicate autoimmune disease, refer to rheumatologist. -If blood tests are negative, continue to explore effective anti-inflammatory medication.         Mila Merry, MD  [facility] [phone] (phone) [phone] (fax)  [facility]

## 2022-12-29 ENCOUNTER — Telehealth: Payer: Self-pay

## 2022-12-29 NOTE — Telephone Encounter (Signed)
Pt given message per notes of Dr. Sherrie Mustache on 12/29/22. Pt verbalized understanding.

## 2023-01-04 ENCOUNTER — Other Ambulatory Visit: Payer: Self-pay | Admitting: Family Medicine

## 2023-01-05 NOTE — Telephone Encounter (Signed)
Requested Prescriptions  Pending Prescriptions Disp Refills   diltiazem (TIAZAC) 180 MG 24 hr capsule [Pharmacy Med Name: DILTIAZEM  180MG   CAP  24 HR TR] 90 capsule 3    Sig: TAKE 1 CAPSULE BY MOUTH DAILY     Cardiovascular: Calcium Channel Blockers 3 Failed - 01/04/2023  3:31 PM      Failed - Cr in normal range and within 360 days    Creatinine, Ser  Date Value Ref Range Status  12/04/2022 1.68 (H) 0.76 - 1.27 mg/dL Final         Passed - ALT in normal range and within 360 days    ALT  Date Value Ref Range Status  12/04/2022 20 0 - 44 IU/L Final         Passed - AST in normal range and within 360 days    AST  Date Value Ref Range Status  12/04/2022 17 0 - 40 IU/L Final         Passed - Last BP in normal range    BP Readings from Last 1 Encounters:  12/26/22 119/65         Passed - Last Heart Rate in normal range    Pulse Readings from Last 1 Encounters:  12/26/22 71         Passed - Valid encounter within last 6 months    Recent Outpatient Visits           1 week ago Arthralgia of both hands   [place] [facility] Malva Limes, MD   1 month ago Type 2 diabetes mellitus with retinopathy, with long-term current use of insulin, macular edema presence unspecified, unspecified laterality, unspecified retinopathy severity (HCC)   [facility] Malva Limes, MD   3 months ago 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

## 2023-01-31 ENCOUNTER — Other Ambulatory Visit: Payer: Self-pay | Admitting: Family Medicine

## 2023-02-15 ENCOUNTER — Other Ambulatory Visit: Payer: Self-pay | Admitting: Family Medicine

## 2023-02-16 NOTE — Telephone Encounter (Signed)
All labs in date.  Requested Prescriptions  Pending Prescriptions Disp Refills   metFORMIN (GLUCOPHAGE) 1000 MG tablet [Pharmacy Med Name: metFORMIN HCl 1000 MG Oral Tablet] 90 tablet 3    Sig: TAKE 1 TABLET BY MOUTH DAILY     Endocrinology:  Diabetes - Biguanides Failed - 02/15/2023 11:01 PM      Failed - Cr in normal range and within 360 days    Creatinine, Ser  Date Value Ref Range Status  12/04/2022 1.68 (H) 0.76 - 1.27 mg/dL Final         Failed - eGFR in normal range and within 360 days    GFR calc Af Amer  Date Value Ref Range Status  10/23/2019 62 >59 mL/min/1.73 Final    Comment:    **[facility] currently reports eGFR in compliance with the current**   recommendations of the [employer]. [facility] will   update reporting as new guidelines are published from the NKF-ASN   Task force.    GFR calc non Af Amer  Date Value Ref Range Status  10/23/2019 54 (L) >59 mL/min/1.73 Final   eGFR  Date Value Ref Range Status  12/04/2022 46 (L) >59 mL/min/1.73 Final         Failed - B12 Level in normal range and within 720 days    No results found for: "VITAMINB12"       Failed - CBC within normal limits and completed in the last 12 months    WBC  Date Value Ref Range Status  12/04/2022 4.9 3.4 - 10.8 x10E3/uL Final  07/30/2015 2.2 (L) 3.8 - 10.6 K/uL Final   RBC  Date Value Ref Range Status  12/04/2022 5.37 4.14 - 5.80 x10E6/uL Final  07/30/2015 5.36 4.40 - 5.90 MIL/uL Final   Hemoglobin  Date Value Ref Range Status  12/04/2022 14.0 13.0 - 17.7 g/dL Final   Hematocrit  Date Value Ref Range Status  12/04/2022 43.7 37.5 - 51.0 % Final   MCHC  Date Value Ref Range Status  12/04/2022 32.0 31.5 - 35.7 g/dL Final  96/08/5407 81.1 32.0 - 36.0 g/dL Final   [facility]  Date Value Ref Range Status  12/04/2022 26.1 (L) 26.6 - 33.0 pg Final  07/30/2015 26.7 26.0 - 34.0 pg Final   MCV  Date Value Ref Range Status  12/04/2022 81 79 - 97 fL Final   No results found  for: "PLTCOUNTKUC", "LABPLAT", "POCPLA" RDW  Date Value Ref Range Status  12/04/2022 13.6 11.6 - 15.4 % Final         Passed - HBA1C is between 0 and 7.9 and within 180 days    Hgb A1c MFr Bld  Date Value Ref Range Status  12/04/2022 7.2 (H) 4.8 - 5.6 % Final    Comment:             Prediabetes: 5.7 - 6.4          Diabetes: >6.4          Glycemic control for adults with diabetes: <7.0          Passed - Valid encounter within last 6 months    Recent Outpatient Visits           1 month ago Arthralgia of both hands   [place] [facility] Malva Limes, MD   2 months ago Type 2 diabetes mellitus with retinopathy, with long-term current use of insulin, macular edema presence unspecified, unspecified laterality, unspecified retinopathy severity (HCC)  [facility] Malva Limes, MD   4 months ago 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

## 2023-02-18 ENCOUNTER — Other Ambulatory Visit: Payer: Self-pay | Admitting: Family Medicine

## 2023-02-18 DIAGNOSIS — M25541 Pain in joints of right hand: Secondary | ICD-10-CM

## 2023-02-19 NOTE — Telephone Encounter (Signed)
Requested Prescriptions  Pending Prescriptions Disp Refills   naproxen (NAPROSYN) 500 MG tablet [Pharmacy Med Name: NAPROXEN 500 MG TABLET] 60 tablet 1    Sig: TAKE 1 TABLET BY MOUTH 2 TIMES DAILY WITH A MEAL.     Analgesics:  NSAIDS Failed - 02/18/2023  4:47 AM      Failed - Manual Review: Labs are only required if the patient has taken medication for more than 8 weeks.      Failed - Cr in normal range and within 360 days    Creatinine, Ser  Date Value Ref Range Status  12/04/2022 1.68 (H) 0.76 - 1.27 mg/dL Final         Passed - HGB in normal range and within 360 days    Hemoglobin  Date Value Ref Range Status  12/04/2022 14.0 13.0 - 17.7 g/dL Final         Passed - PLT in normal range and within 360 days    Platelets  Date Value Ref Range Status  12/04/2022 292 150 - 450 x10E3/uL Final         Passed - HCT in normal range and within 360 days    Hematocrit  Date Value Ref Range Status  12/04/2022 43.7 37.5 - 51.0 % Final         Passed - eGFR is 30 or above and within 360 days    GFR calc Af Amer  Date Value Ref Range Status  10/23/2019 62 >59 mL/min/1.73 Final    Comment:    **[facility] currently reports eGFR in compliance with the current**   recommendations of the [employer]. [facility] will   update reporting as new guidelines are published from the NKF-ASN   Task force.    GFR calc non Af Amer  Date Value Ref Range Status  10/23/2019 54 (L) >59 mL/min/1.73 Final   eGFR  Date Value Ref Range Status  12/04/2022 46 (L) >59 mL/min/1.73 Final         Passed - Patient is not pregnant      Passed - Valid encounter within last 12 months    Recent Outpatient Visits           1 month ago Arthralgia of both hands   [place] [facility] Malva Limes, MD   2 months ago Type 2 diabetes mellitus with retinopathy, with long-term current use of insulin, macular edema presence unspecified, unspecified laterality, unspecified  retinopathy severity (HCC)   [place] [facility] Malva Limes, MD   4 months ago 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 Malva Limes, MD   6 months ago Type 2 diabetes mellitus with retinopathy, with long-term current use of insulin, macular edema presence unspecified, unspecified laterality, unspecified retinopathy severity (HCC)   [place] [facility] Malva Limes, MD   11 months ago Need for influenza vaccination   [facility] Malva Limes, MD       Future Appointments             In 1 month Fisher, Demetrios Isaacs, MD [facility], [facility]

## 2023-03-01 ENCOUNTER — Other Ambulatory Visit: Payer: Self-pay | Admitting: Family Medicine

## 2023-03-01 DIAGNOSIS — E7800 Pure hypercholesterolemia, unspecified: Secondary | ICD-10-CM

## 2023-03-02 NOTE — Telephone Encounter (Signed)
Requested Prescriptions  Refused Prescriptions Disp Refills   pravastatin (PRAVACHOL) 40 MG tablet [Pharmacy Med Name: Pravastatin Sodium 40 MG Oral Tablet] 90 tablet 3    Sig: TAKE 1 TABLET BY MOUTH DAILY     Cardiovascular:  Antilipid - Statins Failed - 03/01/2023 10:14 PM      Failed - Lipid Panel in normal range within the last 12 months    Cholesterol, Total  Date Value Ref Range Status  12/04/2022 114 100 - 199 mg/dL Final   LDL Chol Calc (NIH)  Date Value Ref Range Status  12/04/2022 53 0 - 99 mg/dL Final   LDL Direct  Date Value Ref Range Status  12/04/2022 55 0 - 99 mg/dL Final   HDL  Date Value Ref Range Status  12/04/2022 49 >39 mg/dL Final   Triglycerides  Date Value Ref Range Status  12/04/2022 53 0 - 149 mg/dL Final         Passed - Patient is not pregnant      Passed - Valid encounter within last 12 months    Recent Outpatient Visits           2 months ago Arthralgia of both hands   [place] [facility] Malva Limes, MD   2 months ago Type 2 diabetes mellitus with retinopathy, with long-term current use of insulin, macular edema presence unspecified, unspecified laterality, unspecified retinopathy severity (HCC)   [place] [facility] Malva Limes, MD   5 months ago 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

## 2023-04-09 ENCOUNTER — Encounter: Payer: Self-pay | Admitting: Family Medicine

## 2023-04-09 ENCOUNTER — Ambulatory Visit (INDEPENDENT_AMBULATORY_CARE_PROVIDER_SITE_OTHER): Payer: Managed Care, Other (non HMO) | Admitting: Family Medicine

## 2023-04-09 VITALS — BP 128/70 | HR 66 | Resp 16 | Ht 65.0 in | Wt 178.4 lb

## 2023-04-09 DIAGNOSIS — I10 Essential (primary) hypertension: Secondary | ICD-10-CM | POA: Diagnosis not present

## 2023-04-09 DIAGNOSIS — E1121 Type 2 diabetes mellitus with diabetic nephropathy: Secondary | ICD-10-CM

## 2023-04-09 NOTE — Progress Notes (Signed)
Established patient visit   Patient: Robert [name]   DOB: Jul 10, 1960   62 y.o. Male  MRN: [id number] Visit Date: 04/09/2023  Today's healthcare provider: Mila Merry, MD   Chief Complaint  Patient presents with   Medical Management of Chronic Issues    Diabetes and Kidney Function   Diabetes   Subjective    HPI Presents to follow up on diabetes and increased creatinine from last visit.  Lab Results  Component Value Date   NA 142 12/04/2022   K 4.3 12/04/2022   CREATININE 1.68 (H) 12/04/2022   EGFR 46 (L) 12/04/2022   GLUCOSE 75 12/04/2022   Lab Results  Component Value Date   HGBA1C 7.2 (H) 12/04/2022   Doing well on current medications, feels well aside from chronic pain in hands for which he takes tylenol and ibuprofen. Is working on drinking more water No other new c/o.  Medications: Outpatient Medications Prior to Visit  Medication Sig   aspirin EC 81 MG tablet Take 81 mg by mouth daily.   B-D UF III MINI PEN NEEDLES 31G X 5 MM MISC USE 4 TIMES DAILY   baclofen (LIORESAL) 10 MG tablet Take 1 tablet (10 mg total) by mouth 3 (three) times daily.   Blood Glucose Monitoring Suppl (ACCU-CHEK AVIVA PLUS) w/Device KIT Use to check blood sugar three times daily for insulin dependent diabetes (E11.9)   diltiazem (TIAZAC) 180 MG 24 hr capsule TAKE 1 CAPSULE BY MOUTH DAILY   fluticasone (FLONASE) 50 MCG/ACT nasal spray Place 2 sprays into both nostrils daily.   glucose blood test strip Use as instructed to check sugar three times daily for insulin dependent diabets.   hydrOXYzine (ATARAX) 25 MG tablet TAKE 1 TABLET (25 MG TOTAL) BY MOUTH EVERY DAY AT BEDTIME AS NEEDED FOR INSOMNIA   JARDIANCE 10 MG TABS tablet TAKE 1 TABLET BY MOUTH DAILY   Lancets (ONETOUCH ULTRASOFT) lancets Use as instructed   lidocaine (LIDODERM) 5 % Place 1 patch onto the skin daily. Remove & Discard patch within 12 hours or as directed by MD   losartan-hydrochlorothiazide (HYZAAR) 50-12.5 MG  tablet Take 1 tablet by mouth daily.   metFORMIN (GLUCOPHAGE) 1000 MG tablet TAKE 1 TABLET BY MOUTH DAILY   montelukast (SINGULAIR) 10 MG tablet TAKE 1 TABLET BY MOUTH AT  BEDTIME   naproxen (NAPROSYN) 500 MG tablet TAKE 1 TABLET BY MOUTH 2 TIMES DAILY WITH A MEAL.   omeprazole (PRILOSEC) 20 MG capsule Take 1 capsule (20 mg total) by mouth daily.   pravastatin (PRAVACHOL) 40 MG tablet TAKE 1 TABLET BY MOUTH DAILY   SOLIQUA 100-33 UNT-MCG/ML SOPN INJECT SUBCUTANEOUSLY 60 UNITS  AS DIRECTED DAILY   tadalafil (CIALIS) 20 MG tablet Take 0.5-1 tablets (10-20 mg total) by mouth every other day as needed for erectile dysfunction.   traMADol (ULTRAM) 50 MG tablet Take 1 tablet (50 mg total) by mouth every 8 (eight) hours as needed.   valACYclovir (VALTREX) 1000 MG tablet Take 1 tablet (1,000 mg total) by mouth 3 (three) times daily.   No facility-administered medications prior to visit.    Review of Systems     Objective    BP 128/70   Pulse 66   Resp 16   Ht 5\' 5"  (1.651 m)   Wt 178 lb 6.4 oz (80.9 kg)   SpO2 97%   BMI 29.69 kg/m    Physical Exam  General appearance: Well developed, well nourished male, cooperative and in  no acute distress Head: Normocephalic, without obvious abnormality, atraumatic Respiratory: Respirations even and unlabored, normal respiratory rate Extremities: Above knee amputation of right lower extremity is noted.  Skin: Skin color, texture, turgor normal. No rashes seen  Psych: Appropriate mood and affect. Neurologic: Mental status: Alert, oriented to person, place, and time, thought content appropriate.    Assessment & Plan     1. Diabetic nephropathy associated with type 2 diabetes mellitus (HCC) Doing well on current medications, working on drinking more water for improved kidney functions.  - Hemoglobin A1c  2. Primary hypertension Well controlled.  Continue current medications.   - Comprehensive metabolic panel         Mila Merry, MD   [facility] [phone] (phone) [phone] (fax)  [facility]

## 2023-04-10 LAB — COMPREHENSIVE METABOLIC PANEL
ALT: 37 [IU]/L (ref 0–44)
AST: 26 [IU]/L (ref 0–40)
Albumin: 4.8 g/dL (ref 3.9–4.9)
Alkaline Phosphatase: 113 [IU]/L (ref 44–121)
BUN/Creatinine Ratio: 10 (ref 10–24)
BUN: 13 mg/dL (ref 8–27)
Bilirubin Total: 0.3 mg/dL (ref 0.0–1.2)
CO2: 24 mmol/L (ref 20–29)
Calcium: 9.7 mg/dL (ref 8.6–10.2)
Chloride: 103 mmol/L (ref 96–106)
Creatinine, Ser: 1.33 mg/dL — ABNORMAL HIGH (ref 0.76–1.27)
Globulin, Total: 2.3 g/dL (ref 1.5–4.5)
Glucose: 139 mg/dL — ABNORMAL HIGH (ref 70–99)
Potassium: 4.4 mmol/L (ref 3.5–5.2)
Sodium: 142 mmol/L (ref 134–144)
Total Protein: 7.1 g/dL (ref 6.0–8.5)
eGFR: 61 mL/min/{1.73_m2} (ref >59)

## 2023-04-10 LAB — HEMOGLOBIN A1C
Est. average glucose Bld gHb Est-mCnc: 157 mg/dL
Hgb A1c MFr Bld: 7.1 % — ABNORMAL HIGH (ref 4.8–5.6)

## 2023-06-14 ENCOUNTER — Other Ambulatory Visit: Payer: Self-pay | Admitting: Family Medicine

## 2023-06-14 DIAGNOSIS — E1165 Type 2 diabetes mellitus with hyperglycemia: Secondary | ICD-10-CM

## 2023-06-14 DIAGNOSIS — Z794 Long term (current) use of insulin: Secondary | ICD-10-CM

## 2023-07-18 ENCOUNTER — Other Ambulatory Visit: Payer: Self-pay | Admitting: Family Medicine

## 2023-07-18 NOTE — Telephone Encounter (Signed)
 Requested Prescriptions  Pending Prescriptions Disp Refills   montelukast (SINGULAIR) 10 MG tablet [Pharmacy Med Name: Montelukast Sodium 10 MG Oral Tablet] 90 tablet 0    Sig: TAKE 1 TABLET BY MOUTH AT  BEDTIME     Pulmonology:  Leukotriene Inhibitors Passed - 07/18/2023  2:51 PM      Passed - Valid encounter within last 12 months    Recent Outpatient Visits           3 months ago Diabetic nephropathy associated with type 2 diabetes mellitus (HCC)   [place] [facility] Malva Limes, MD   6 months ago Arthralgia of both hands   [place] [facility] Malva Limes, MD   7 months ago Type 2 diabetes mellitus with retinopathy, with long-term current use of insulin, macular edema presence unspecified, unspecified laterality, unspecified retinopathy severity (HCC)   [place] [facility] Malva Limes, MD   9 months ago 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

## 2023-07-25 ENCOUNTER — Ambulatory Visit: Payer: Self-pay | Admitting: Family Medicine

## 2023-07-25 ENCOUNTER — Telehealth: Payer: Self-pay

## 2023-07-25 NOTE — Telephone Encounter (Signed)
 Reviewed, will evaluate patient during OV

## 2023-07-25 NOTE — Telephone Encounter (Unsigned)
 Copied from CRM [id number]. Topic: Clinical - Medication Question >> Jul 25, 2023  8:04 AM Carlatta H wrote: Reason for CRM: Patient would like a prescription called in for cough//Patient has had cough for about 2 days//[facility]//Please call patient once called in

## 2023-07-25 NOTE — Telephone Encounter (Signed)
 Copied from CRM [id number]. Topic: Clinical - Red Word Triage >> Jul 25, 2023 11:46 AM Robert [name] wrote: Red Word that prompted transfer to Nurse Triage: severe cough 2 days  Reason for CRM: Patient would like a prescription called in for cough//Patient has had cough for about 2 days//[facility] [place] [facility]//Please call patient once called in    Chief Complaint: Dry cough Symptoms: Cough, congestion,  Frequency: 2 days Pertinent Negatives: Patient denies fever, coughing up blood, difficulty breathing, ankle swelling Disposition: [] ED /[] Urgent Care (no appt availability in office) / [x] Appointment(In office/virtual)/ []  [facility]/ [] Home Care/ [] Refused Recommended Disposition /[] [facility]/ []  Follow-up with PCP Additional Notes: Patient called and advised that he has had a dry cough for the past two days. Patient states he has had a persistent dry cough with congestion.  He denies any fever, coughing up blood, difficulty breathing, or ankle swelling.  He states this happens every year around this time. Patient initially messaged the office this morning asking for something for cough but the recommendation after triage with this RN was to see a provider within 24 hours. Appointment made for tomorrow 07/26/2023 at 8:20 am with Dr Neita Garnet at patient's PCP office. Care Advice given as per protocol. Patient is also advised that if he gets worse to go to the Emergency Room. Patient verbalized understanding.  Reason for Disposition  [1] Continuous (nonstop) coughing interferes with work or school AND [2] no improvement using cough treatment per Care Advice  Answer Assessment - Initial Assessment Questions 1. ONSET: "When did the cough begin?"      2 days ago 2. SEVERITY: "How bad is the cough today?"      persistent 3. SPUTUM: "Describe the color of your sputum" (none, dry cough; clear, white, yellow, green)     dry 4. HEMOPTYSIS: "Are you coughing up  any blood?" If so ask: "How much?" (flecks, streaks, tablespoons, etc.)     No 5. DIFFICULTY BREATHING: "Are you having difficulty breathing?" If Yes, ask: "How bad is it?" (e.g., mild, moderate, severe)    - MILD: No SOB at rest, mild SOB with walking, speaks normally in sentences, can lie down, no retractions, pulse < 100.    - MODERATE: SOB at rest, SOB with minimal exertion and prefers to sit, cannot lie down flat, speaks in phrases, mild retractions, audible wheezing, pulse 100-120.    - SEVERE: Very SOB at rest, speaks in single words, struggling to breathe, sitting hunched forward, retractions, pulse > 120      No 6. FEVER: "Do you have a fever?" If Yes, ask: "What is your temperature, how was it measured, and when did it start?"     No 7. CARDIAC HISTORY: "Do you have any history of heart disease?" (e.g., heart attack, congestive heart failure)      Stent put in heart many years ago 8. LUNG HISTORY: "Do you have any history of lung disease?"  (e.g., pulmonary embolus, asthma, emphysema)     No 9. PE RISK FACTORS: "Do you have a history of blood clots?" (or: recent major surgery, recent prolonged travel, bedridden)     Not that he knows of 10. OTHER SYMPTOMS: "Do you have any other symptoms?" (e.g., runny nose, wheezing, chest pain)       Congestion, Runny nose 12. TRAVEL: "Have you traveled out of the country in the last month?" (e.g., travel history, exposures)       No  Protocols used:  Cough - Acute Non-Productive-A-AH

## 2023-07-25 NOTE — Telephone Encounter (Signed)
 He can OTC robitussin DM or Delsym. If any fever or shortness of breath, or if cough does not improving within a week then he needs to be seen.

## 2023-07-26 ENCOUNTER — Encounter: Payer: Self-pay | Admitting: Family Medicine

## 2023-07-26 ENCOUNTER — Ambulatory Visit: Admitting: Family Medicine

## 2023-07-26 VITALS — BP 117/69 | HR 95 | Temp 98.8°F | Ht 65.0 in | Wt 171.0 lb

## 2023-07-26 DIAGNOSIS — R051 Acute cough: Secondary | ICD-10-CM | POA: Diagnosis not present

## 2023-07-26 MED ORDER — HYDROCOD POLI-CHLORPHE POLI ER 10-8 MG/5ML PO SUER
5.0000 mL | Freq: Every evening | ORAL | 0 refills | Status: DC | PRN
Start: 2023-07-26 — End: 2023-08-10

## 2023-07-26 NOTE — Telephone Encounter (Signed)
 Patient advised and verbalized understanding

## 2023-07-26 NOTE — Progress Notes (Signed)
 ACUTE PATIENT VISIT    Patient: Robert [name]   DOB: [birth year]   63 y.o. Male  MRN: [id number] Visit Date: 07/26/2023  Today's healthcare provider: Ronnald Ramp, MD   PCP: Malva Limes, MD   Chief Complaint  Patient presents with   Cough    Cough and congestion for a few days he is requesting something to help with the cough     Subjective     HPI     Cough    Additional comments: Cough and congestion for a few days he is requesting something to help with the cough       Last edited by Thedora Hinders, CMA on 07/26/2023  8:27 AM.       Discussed the use of AI scribe software for clinical note transcription with the patient, who gave verbal consent to proceed.  History of Present Illness   The patient is a 63 year old with diabetes and hypertension who presents with an acute cough.  He has experienced an acute dry cough for three days, which worsened about a day ago, particularly at night, disrupting his sleep. The cough is accompanied by nasal congestion, which has shown some improvement. No fever, chest tightness, diarrhea, nausea, or headaches are present.  He has a history of chronic cough that occurs annually, for which he has previously been prescribed a liquid medication. He has not taken any medication for this episode until yesterday evening when he used Nyquil, which provided some relief and allowed him to sleep. He has no known drug allergies.  He has a history of diabetes and hypertension. He does not smoke.      Past Medical History:  Diagnosis Date   Diabetes mellitus without complication (HCC)    Hypertension     Medications: Outpatient Medications Prior to Visit  Medication Sig   aspirin EC 81 MG tablet Take 81 mg by mouth daily.   B-D UF III MINI PEN NEEDLES 31G X 5 MM MISC USE 4 TIMES DAILY   baclofen (LIORESAL) 10 MG tablet Take 1 tablet (10 mg total) by mouth 3 (three) times daily.   Blood Glucose Monitoring Suppl  (ACCU-CHEK AVIVA PLUS) w/Device KIT Use to check blood sugar three times daily for insulin dependent diabetes (E11.9)   diltiazem (TIAZAC) 180 MG 24 hr capsule TAKE 1 CAPSULE BY MOUTH DAILY   fluticasone (FLONASE) 50 MCG/ACT nasal spray Place 2 sprays into both nostrils daily.   glucose blood test strip Use as instructed to check sugar three times daily for insulin dependent diabets.   hydrOXYzine (ATARAX) 25 MG tablet TAKE 1 TABLET (25 MG TOTAL) BY MOUTH EVERY DAY AT BEDTIME AS NEEDED FOR INSOMNIA   JARDIANCE 10 MG TABS tablet TAKE 1 TABLET BY MOUTH DAILY   Lancets (ONETOUCH ULTRASOFT) lancets Use as instructed   lidocaine (LIDODERM) 5 % Place 1 patch onto the skin daily. Remove & Discard patch within 12 hours or as directed by MD   losartan-hydrochlorothiazide (HYZAAR) 50-12.5 MG tablet Take 1 tablet by mouth daily.   metFORMIN (GLUCOPHAGE) 1000 MG tablet TAKE 1 TABLET BY MOUTH DAILY   montelukast (SINGULAIR) 10 MG tablet TAKE 1 TABLET BY MOUTH AT  BEDTIME   naproxen (NAPROSYN) 500 MG tablet TAKE 1 TABLET BY MOUTH 2 TIMES DAILY WITH A MEAL.   omeprazole (PRILOSEC) 20 MG capsule Take 1 capsule (20 mg total) by mouth daily.   pravastatin (PRAVACHOL) 40 MG tablet TAKE 1 TABLET BY  MOUTH DAILY   SOLIQUA 100-33 UNT-MCG/ML SOPN INJECT SUBCUTANEOUSLY 60 UNITS  AS DIRECTED DAILY   tadalafil (CIALIS) 20 MG tablet Take 0.5-1 tablets (10-20 mg total) by mouth every other day as needed for erectile dysfunction.   traMADol (ULTRAM) 50 MG tablet Take 1 tablet (50 mg total) by mouth every 8 (eight) hours as needed.   valACYclovir (VALTREX) 1000 MG tablet Take 1 tablet (1,000 mg total) by mouth 3 (three) times daily.   No facility-administered medications prior to visit.    Review of Systems  Last metabolic panel Lab Results  Component Value Date   GLUCOSE 139 (H) 04/09/2023   NA 142 04/09/2023   K 4.4 04/09/2023   CL 103 04/09/2023   CO2 24 04/09/2023   BUN 13 04/09/2023   CREATININE 1.33 (H)  04/09/2023   EGFR 61 04/09/2023   CALCIUM 9.7 04/09/2023   PHOS 3.3 11/23/2016   PROT 7.1 04/09/2023   ALBUMIN 4.8 04/09/2023   LABGLOB 2.3 04/09/2023   AGRATIO 2.1 11/21/2021   BILITOT 0.3 04/09/2023   ALKPHOS 113 04/09/2023   AST 26 04/09/2023   ALT 37 04/09/2023   ANIONGAP 12 07/30/2015   Last hemoglobin A1c Lab Results  Component Value Date   HGBA1C 7.1 (H) 04/09/2023        Objective    BP 117/69   Pulse 95   Temp 98.8 F (37.1 C) (Oral)   Ht 5\' 5"  (1.651 m)   Wt 171 lb (77.6 kg)   SpO2 99%   BMI 28.46 kg/m  BP Readings from Last 3 Encounters:  07/26/23 117/69  04/09/23 128/70  12/26/22 119/65   Wt Readings from Last 3 Encounters:  07/26/23 171 lb (77.6 kg)  04/09/23 178 lb 6.4 oz (80.9 kg)  12/26/22 178 lb (80.7 kg)        Physical Exam  Physical Exam   HEENT: Right turbinate swollen. CHEST: Lungs clear to auscultation bilaterally.       No results found for any visits on 07/26/23.  Assessment & Plan     Problem List Items Addressed This Visit       Other   Cough - Primary   Relevant Medications   chlorpheniramine-HYDROcodone (TUSSIONEX) 10-8 MG/5ML       Acute Cough Presents with an acute dry cough worsening over the past three days, particularly at night, causing difficulty sleeping. Associated nasal congestion but no fever, chest tightness, or other systemic symptoms. The cough is non-productive, with a history of similar annual episodes. Lungs are clear on examination, and there is no wheezing or crackles. The nasal turbinate on the right side appears swollen, indicating congestion. Discussed treatment options including Desenex, which contains a narcotic to help calm the cough, and non-narcotic alternatives such as cough syrup with anti-nausea medication and Tessalon pearls. He prefers a liquid medication. - Prescribe Desenex, 5 milliliters at bedtime as needed for cough. - [facility]  Diabetes Diabetes without discussion of current management or complications in this encounter.  Hypertension Hypertension without discussion of current management or complications in this encounter.       No follow-ups on file.       Ronnald Ramp, MD  [facility] [phone] (phone) [phone] (fax)  [facility]

## 2023-08-10 ENCOUNTER — Encounter: Payer: Self-pay | Admitting: Family Medicine

## 2023-08-10 ENCOUNTER — Ambulatory Visit (INDEPENDENT_AMBULATORY_CARE_PROVIDER_SITE_OTHER): Payer: Managed Care, Other (non HMO) | Admitting: Family Medicine

## 2023-08-10 VITALS — BP 117/58 | HR 101 | Ht 65.0 in | Wt 170.2 lb

## 2023-08-10 DIAGNOSIS — Z0000 Encounter for general adult medical examination without abnormal findings: Secondary | ICD-10-CM

## 2023-08-10 DIAGNOSIS — I10 Essential (primary) hypertension: Secondary | ICD-10-CM

## 2023-08-10 DIAGNOSIS — Z7984 Long term (current) use of oral hypoglycemic drugs: Secondary | ICD-10-CM

## 2023-08-10 DIAGNOSIS — Z89611 Acquired absence of right leg above knee: Secondary | ICD-10-CM

## 2023-08-10 DIAGNOSIS — Z0001 Encounter for general adult medical examination with abnormal findings: Secondary | ICD-10-CM | POA: Diagnosis not present

## 2023-08-10 DIAGNOSIS — R051 Acute cough: Secondary | ICD-10-CM

## 2023-08-10 DIAGNOSIS — E1121 Type 2 diabetes mellitus with diabetic nephropathy: Secondary | ICD-10-CM | POA: Diagnosis not present

## 2023-08-10 DIAGNOSIS — Z125 Encounter for screening for malignant neoplasm of prostate: Secondary | ICD-10-CM

## 2023-08-10 MED ORDER — HYDROCOD POLI-CHLORPHE POLI ER 10-8 MG/5ML PO SUER: 5.0000 mL | Freq: Every evening | ORAL | 0 refills | Status: AC | PRN
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
## 2023-08-11 LAB — COMPREHENSIVE METABOLIC PANEL WITH GFR
ALT: 38 IU/L (ref 0–44)
AST: 38 IU/L (ref 0–40)
Albumin: 4.9 g/dL (ref 3.9–4.9)
Alkaline Phosphatase: 112 IU/L (ref 44–121)
BUN/Creatinine Ratio: 14 (ref 10–24)
BUN: 20 mg/dL (ref 8–27)
Bilirubin Total: 0.3 mg/dL (ref 0.0–1.2)
CO2: 21 mmol/L (ref 20–29)
Calcium: 10.5 mg/dL — ABNORMAL HIGH (ref 8.6–10.2)
Chloride: 103 mmol/L (ref 96–106)
Creatinine, Ser: 1.43 mg/dL — ABNORMAL HIGH (ref 0.76–1.27)
Globulin, Total: 2.4 g/dL (ref 1.5–4.5)
Glucose: 95 mg/dL (ref 70–99)
Potassium: 4.3 mmol/L (ref 3.5–5.2)
Sodium: 142 mmol/L (ref 134–144)
Total Protein: 7.3 g/dL (ref 6.0–8.5)
eGFR: 56 mL/min/{1.73_m2} — ABNORMAL LOW (ref >59)

## 2023-08-11 LAB — MICROALBUMIN / CREATININE URINE RATIO
Creatinine, Urine: 149.9 mg/dL
Microalb/Creat Ratio: 13 mg/g{creat} (ref 0–29)
Microalbumin, Urine: 19.8 ug/mL

## 2023-08-11 LAB — LIPID PANEL
Chol/HDL Ratio: 2.2 ratio (ref 0.0–5.0)
Cholesterol, Total: 141 mg/dL (ref 100–199)
HDL: 64 mg/dL (ref >39)
LDL Chol Calc (NIH): 61 mg/dL (ref 0–99)
Triglycerides: 82 mg/dL (ref 0–149)
VLDL Cholesterol Cal: 16 mg/dL (ref 5–40)

## 2023-08-11 LAB — PSA TOTAL (REFLEX TO FREE): Prostate Specific Ag, Serum: 0.5 ng/mL (ref 0.0–4.0)

## 2023-08-11 LAB — HEMOGLOBIN A1C
Est. average glucose Bld gHb Est-mCnc: 212 mg/dL
Hgb A1c MFr Bld: 9 % — ABNORMAL HIGH (ref 4.8–5.6)

## 2023-08-12 ENCOUNTER — Other Ambulatory Visit: Payer: Self-pay | Admitting: Family Medicine

## 2023-08-12 DIAGNOSIS — E1121 Type 2 diabetes mellitus with diabetic nephropathy: Secondary | ICD-10-CM

## 2023-08-12 MED ORDER — EMPAGLIFLOZIN 25 MG PO TABS: 25.0000 mg | ORAL_TABLET | Freq: Every day | ORAL | 1 refills | Status: DC

## 2023-08-20 ENCOUNTER — Ambulatory Visit: Payer: Self-pay

## 2023-08-20 MED ORDER — BENZONATATE 200 MG PO CAPS: 200.0000 mg | ORAL_CAPSULE | Freq: Two times a day (BID) | ORAL | 0 refills | Status: DC | PRN

## 2023-08-20 NOTE — Telephone Encounter (Signed)
 Chief Complaint: Ongoing cough Symptoms: see notes Frequency: ongoing since March 13 Pertinent Negatives: Patient denies fever, chest pain Disposition: [] ED /[] Urgent Care (no appt availability in office) / [] Appointment(In office/virtual)/ []  [facility]/ [x] Home Care/ [] Refused Recommended Disposition /[] [facility]/ [x]  Follow-up with PCP Additional Notes: Patient called in stating his cough is not resolving after being seen in office for it twice and given prescription cough medication. Patient states the cough medication is not helping, and last night he woke up every hour. Patient is not having any new symptoms and no changes in his breathing, but is asking if PCP can call him in a stronger[facility] Please advise.    Copied from CRM [id number]. Topic: Clinical - Red Word Triage >> Aug 20, 2023 10:16 AM Emylou G wrote: [facility] that prompted transfer to Nurse Triage: Bad cough.. worse night last night.. can't sleep - keeps getting up during the night Reason for Disposition  Cough    Please see notes  Answer Assessment - Initial Assessment Questions 1. ONSET: "When did the cough begin?"      Ongoing since appt on 03/13 2. SEVERITY: "How bad is the cough today?"      Patient was up every hour with cough - despite prescription medication 3. SPUTUM: "Describe the color of your sputum" (none, dry cough; clear, white, yellow, green)     No 4. HEMOPTYSIS: "Are you coughing up any blood?" If so ask: "How much?" (flecks, streaks, tablespoons, etc.)     No 5. DIFFICULTY BREATHING: "Are you having difficulty breathing?" If Yes, ask: "How bad is it?" (e.g., mild, moderate, severe)    - MILD: No SOB at rest, mild SOB with walking, speaks normally in sentences, can lie down, no retractions, pulse < 100.    - MODERATE: SOB at rest, SOB with minimal exertion and prefers to sit, cannot lie down flat, speaks in phrases, mild  retractions, audible wheezing, pulse 100-120.    - SEVERE: Very SOB at rest, speaks in single words, struggling to breathe, sitting hunched forward, retractions, pulse > 120      Breathing feels the same 6. FEVER: "Do you have a fever?" If Yes, ask: "What is your temperature, how was it measured, and when did it start?"     Patient doesn't feel he has a fever 7. CARDIAC HISTORY: "Do you have any history of heart disease?" (e.g., heart attack, congestive heart failure)      No 8. LUNG HISTORY: "Do you have any history of lung disease?"  (e.g., pulmonary embolus, asthma, emphysema)     No 9. PE RISK FACTORS: "Do you have a history of blood clots?" (or: recent major surgery, recent prolonged travel, bedridden)     N/a 10. OTHER SYMPTOMS: "Do you have any other symptoms?" (e.g., runny nose, wheezing, chest pain)       No  Protocols used: Cough - Acute Non-Productive-A-AH

## 2023-08-20 NOTE — Addendum Note (Signed)
 Addended by: Malva Limes on: 08/20/2023 04:52 PM   Modules accepted: Orders

## 2023-09-23 ENCOUNTER — Other Ambulatory Visit: Payer: Self-pay | Admitting: Family Medicine

## 2023-10-10 ENCOUNTER — Other Ambulatory Visit: Payer: Self-pay | Admitting: Family Medicine

## 2023-10-18 ENCOUNTER — Other Ambulatory Visit: Payer: Self-pay | Admitting: Family Medicine

## 2023-10-18 DIAGNOSIS — F5101 Primary insomnia: Secondary | ICD-10-CM

## 2023-11-08 ENCOUNTER — Other Ambulatory Visit: Payer: Self-pay | Admitting: Family Medicine

## 2023-11-12 ENCOUNTER — Encounter: Payer: Self-pay | Admitting: Family Medicine

## 2023-11-12 ENCOUNTER — Ambulatory Visit: Admitting: Family Medicine

## 2023-11-12 VITALS — BP 122/68 | HR 72 | Ht 65.0 in | Wt 176.6 lb

## 2023-11-12 DIAGNOSIS — I10 Essential (primary) hypertension: Secondary | ICD-10-CM

## 2023-11-12 DIAGNOSIS — E11319 Type 2 diabetes mellitus with unspecified diabetic retinopathy without macular edema: Secondary | ICD-10-CM

## 2023-11-12 DIAGNOSIS — Z794 Long term (current) use of insulin: Secondary | ICD-10-CM | POA: Diagnosis not present

## 2023-11-12 NOTE — Progress Notes (Signed)
 Established patient visit   Patient: Robert [name]   DOB: 24-Mar-1961   63 y.o. Male  MRN: [id number] Visit Date: 11/12/2023  Today's healthcare provider: Nancyann Perry, MD   Chief Complaint  Patient presents with   Medical Management of Chronic Issues    3 month follow-up T2DM and    Subjective    Follow up diabetes and hypertension since adding Jardiance  25mg  three months ago. Taking consistently every day and tolerating without side effects.  Lab Results  Component Value Date   HGBA1C 9.0 (H) 08/10/2023   Lab Results  Component Value Date   NA 142 08/10/2023   K 4.3 08/10/2023   CREATININE 1.43 (H) 08/10/2023   EGFR 56 (L) 08/10/2023   GLUCOSE 95 08/10/2023   No other c/o today.   Medications: Outpatient Medications Prior to Visit  Medication Sig   aspirin EC 81 MG tablet Take 81 mg by mouth daily.   B-D UF III MINI PEN NEEDLES 31G X 5 MM MISC USE 4 TIMES DAILY   baclofen  (LIORESAL ) 10 MG tablet Take 1 tablet (10 mg total) by mouth 3 (three) times daily.   Blood Glucose Monitoring Suppl (ACCU-CHEK AVIVA PLUS) w/Device KIT Use to check blood sugar three times daily for insulin  dependent diabetes (E11.9)   diltiazem  (TIAZAC ) 180 MG 24 hr capsule TAKE 1 CAPSULE BY MOUTH DAILY   empagliflozin  (JARDIANCE ) 25 MG TABS tablet Take 1 tablet (25 mg total) by mouth daily.   fluticasone  (FLONASE ) 50 MCG/ACT nasal spray Place 2 sprays into both nostrils daily.   glucose blood test strip Use as instructed to check sugar three times daily for insulin  dependent diabets.   hydrOXYzine  (ATARAX ) 25 MG tablet TAKE 1 TABLET BY MOUTH EVERY DAY AT BEDTIME AS NEEDED FOR INSOMNIA   ibuprofen  (ADVIL ) 800 MG tablet TAKE 1 TABLET BY MOUTH TWICE  DAILY AS NEEDED   Lancets (ONETOUCH ULTRASOFT) lancets Use as instructed   losartan -hydrochlorothiazide (HYZAAR) 50-12.5 MG tablet Take 1 tablet by mouth daily.   metFORMIN  (GLUCOPHAGE ) 1000 MG tablet TAKE 1 TABLET BY MOUTH DAILY   montelukast   (SINGULAIR ) 10 MG tablet TAKE 1 TABLET BY MOUTH AT  BEDTIME   naproxen  (NAPROSYN ) 500 MG tablet TAKE 1 TABLET BY MOUTH 2 TIMES DAILY WITH A MEAL.   omeprazole  (PRILOSEC) 20 MG capsule Take 1 capsule (20 mg total) by mouth daily.   pravastatin  (PRAVACHOL ) 40 MG tablet TAKE 1 TABLET BY MOUTH DAILY   SOLIQUA  100-33 UNT-MCG/ML SOPN INJECT SUBCUTANEOUSLY 60 UNITS  AS DIRECTED DAILY   tadalafil  (CIALIS ) 20 MG tablet Take 0.5-1 tablets (10-20 mg total) by mouth every other day as needed for erectile dysfunction.   traMADol  (ULTRAM ) 50 MG tablet Take 1 tablet (50 mg total) by mouth every 8 (eight) hours as needed.   valACYclovir  (VALTREX ) 1000 MG tablet Take 1 tablet (1,000 mg total) by mouth 3 (three) times daily.   [DISCONTINUED] benzonatate  (TESSALON ) 200 MG capsule Take 1 capsule (200 mg total) by mouth 2 (two) times daily as needed for cough.   No facility-administered medications prior to visit.       Objective    BP 122/68 (BP Location: Left Arm, Patient Position: Sitting, Cuff Size: Normal)   Pulse 72   Ht 5' 5 (1.651 m)   Wt 176 lb 9.6 oz (80.1 kg)   SpO2 97%   BMI 29.39 kg/m    Physical Exam   General appearance: Well developed, well nourished male, cooperative  and in no acute distress Head: Normocephalic, without obvious abnormality, atraumatic Respiratory: Respirations even and unlabored, normal respiratory rate Extremities: Above knee amputation of right lower extremity is noted.  Skin: Skin color, texture, turgor normal. No rashes seen  Psych: Appropriate mood and affect. Neurologic: Mental status: Alert, oriented to person, place, and time, thought content appropriate.     Assessment & Plan     1. Type 2 diabetes mellitus with retinopathy, with long-term current use of insulin , macular edema presence unspecified, unspecified laterality, unspecified retinopathy severity (HCC) (Primary) Tolerating initiation of 25mg  Jardiance  after last visit.   - Renal function  panel - Hemoglobin A1c  2. Primary hypertension Well controlled.  Continue current medications.            Nancyann Perry, MD  [facility] [phone] (phone) [phone] (fax)  [facility]

## 2023-11-13 ENCOUNTER — Ambulatory Visit: Payer: Self-pay | Admitting: Family Medicine

## 2023-11-13 LAB — RENAL FUNCTION PANEL
Albumin: 4.6 g/dL (ref 3.9–4.9)
BUN/Creatinine Ratio: 11 (ref 10–24)
BUN: 17 mg/dL (ref 8–27)
CO2: 18 mmol/L — ABNORMAL LOW (ref 20–29)
Calcium: 9.7 mg/dL (ref 8.6–10.2)
Chloride: 105 mmol/L (ref 96–106)
Creatinine, Ser: 1.5 mg/dL — ABNORMAL HIGH (ref 0.76–1.27)
Glucose: 70 mg/dL (ref 70–99)
Phosphorus: 3.5 mg/dL (ref 2.8–4.1)
Potassium: 4.1 mmol/L (ref 3.5–5.2)
Sodium: 141 mmol/L (ref 134–144)
eGFR: 53 mL/min/{1.73_m2} — ABNORMAL LOW (ref >59)

## 2023-11-13 LAB — HEMOGLOBIN A1C
Est. average glucose Bld gHb Est-mCnc: 194 mg/dL
Hgb A1c MFr Bld: 8.4 % — ABNORMAL HIGH (ref 4.8–5.6)

## 2023-12-11 ENCOUNTER — Other Ambulatory Visit: Payer: Self-pay | Admitting: Family Medicine

## 2023-12-11 DIAGNOSIS — I10 Essential (primary) hypertension: Secondary | ICD-10-CM

## 2023-12-17 ENCOUNTER — Other Ambulatory Visit: Payer: Self-pay | Admitting: Family Medicine

## 2024-01-03 ENCOUNTER — Other Ambulatory Visit: Payer: Self-pay | Admitting: Family Medicine

## 2024-01-03 DIAGNOSIS — F5101 Primary insomnia: Secondary | ICD-10-CM

## 2024-01-08 ENCOUNTER — Other Ambulatory Visit: Payer: Self-pay | Admitting: Family Medicine

## 2024-01-28 ENCOUNTER — Other Ambulatory Visit: Payer: Self-pay | Admitting: Family Medicine

## 2024-01-28 DIAGNOSIS — E7800 Pure hypercholesterolemia, unspecified: Secondary | ICD-10-CM

## 2024-01-28 DIAGNOSIS — E1121 Type 2 diabetes mellitus with diabetic nephropathy: Secondary | ICD-10-CM

## 2024-01-30 LAB — HM DIABETES EYE EXAM

## 2024-02-09 LAB — COLOGUARD: COLOGUARD: NEGATIVE

## 2024-02-20 ENCOUNTER — Ambulatory Visit: Payer: Self-pay | Admitting: Family Medicine

## 2024-02-20 ENCOUNTER — Encounter: Payer: Self-pay | Admitting: Family Medicine

## 2024-02-20 VITALS — BP 113/69 | HR 84 | Resp 16 | Ht 65.0 in | Wt 171.7 lb

## 2024-02-20 DIAGNOSIS — E1140 Type 2 diabetes mellitus with diabetic neuropathy, unspecified: Secondary | ICD-10-CM

## 2024-02-20 NOTE — Patient Instructions (Signed)
 Please review the attached list of medications and notify my office if there are any errors.   Start taking over the counter vitamin B12 1,000 mcg every day  Also Try OTC alpha-lipoic acid 600 three a day to see if it helps with neuropathy. If it's not helping after a month you can stop taking it.

## 2024-02-20 NOTE — Progress Notes (Signed)
 Established patient visit   Patient: Robert [name]   DOB: December 23, 1960   64 y.o. Male  MRN: [id number] Visit Date: 02/20/2024  Today's healthcare provider: Nancyann Perry, MD   Chief Complaint  Patient presents with   Follow-up    Disability   Numbness    On hands-worsening   Flu Vaccine    Patient declined today.   Subjective    Discussed the use of AI scribe software for clinical note transcription with the patient, who gave verbal consent to proceed.  History of Present Illness   Robert [name] is a 63 year old male who presents with numbness and tingling in his right hand.  He experiences numbness and tingling in his right hand, described as 'needles and stuff,' primarily in the mornings and at night. The symptoms have worsened over time and are more pronounced in the tips of his fingers and hand. The left hand is also affected but to a lesser extent.  He recalls receiving an injection in the right hand at a facility across the street from the hospital, possibly [facility], but it did not alleviate the symptoms, and he decided not to return for further treatment.  No swelling in the joints, pain in the wrist or forearm, or numbness or tingling in the feet.  He does not currently take any vitamins or supplements, although he previously took turmeric and some vitamins. He reports that he is not currently taking any prescription medications.       Medications: Outpatient Medications Prior to Visit  Medication Sig   aspirin EC 81 MG tablet Take 81 mg by mouth daily.   baclofen  (LIORESAL ) 10 MG tablet Take 1 tablet (10 mg total) by mouth 3 (three) times daily.   BD PEN NEEDLE MINI ULTRAFINE 31G X 5 MM MISC USE 4 TIMES DAILY   Blood Glucose Monitoring Suppl (ACCU-CHEK AVIVA PLUS) w/Device KIT Use to check blood sugar three times daily for insulin  dependent diabetes (E11.9)   diltiazem  (TIAZAC ) 180 MG 24 hr capsule TAKE 1 CAPSULE BY MOUTH DAILY   fluticasone  (FLONASE ) 50  MCG/ACT nasal spray Place 2 sprays into both nostrils daily.   glucose blood test strip Use as instructed to check sugar three times daily for insulin  dependent diabets.   hydrOXYzine  (ATARAX ) 25 MG tablet TAKE 1 TABLET BY MOUTH EVERY DAY AT BEDTIME AS NEEDED FOR INSOMNIA   ibuprofen  (ADVIL ) 800 MG tablet TAKE 1 TABLET BY MOUTH TWICE  DAILY AS NEEDED   JARDIANCE  25 MG TABS tablet TAKE 1 TABLET BY MOUTH DAILY   Lancets (ONETOUCH ULTRASOFT) lancets Use as instructed   losartan -hydrochlorothiazide (HYZAAR) 50-12.5 MG tablet TAKE 1 TABLET BY MOUTH DAILY   metFORMIN  (GLUCOPHAGE ) 1000 MG tablet TAKE 1 TABLET BY MOUTH DAILY   montelukast  (SINGULAIR ) 10 MG tablet TAKE 1 TABLET BY MOUTH AT  BEDTIME   naproxen  (NAPROSYN ) 500 MG tablet TAKE 1 TABLET BY MOUTH 2 TIMES DAILY WITH A MEAL.   omeprazole  (PRILOSEC) 20 MG capsule Take 1 capsule (20 mg total) by mouth daily.   pravastatin  (PRAVACHOL ) 40 MG tablet TAKE 1 TABLET BY MOUTH DAILY   SOLIQUA  100-33 UNT-MCG/ML SOPN INJECT SUBCUTANEOUSLY 60 UNITS  AS DIRECTED DAILY   tadalafil  (CIALIS ) 20 MG tablet Take 0.5-1 tablets (10-20 mg total) by mouth every other day as needed for erectile dysfunction.   traMADol  (ULTRAM ) 50 MG tablet Take 1 tablet (50 mg total) by mouth every 8 (eight) hours as needed.  valACYclovir  (VALTREX ) 1000 MG tablet Take 1 tablet (1,000 mg total) by mouth 3 (three) times daily.   No facility-administered medications prior to visit.   Review of Systems     Objective    BP 113/69 (BP Location: Left Arm, Patient Position: Sitting, Cuff Size: Large)   Pulse 84   Resp 16   Ht 5' 5 (1.651 m)   Wt 171 lb 11.2 oz (77.9 kg)   SpO2 97%   BMI 28.57 kg/m   Physical Exam  Normal pulses of both hands. Normal sensation. No swelling or erythema.    Assessment & Plan    1. Type 2 diabetes mellitus with diabetic neuropathy, without long-term current use of insulin  (HCC) (Primary)  Try OTC B12 and alpha-lipoic acid.   Future  Appointments  Date Time Provider Department Center  05/16/2024  3:00 PM Gasper Nancyann BRAVO, MD [facility]  08/11/2024  1:40 PM Gasper Nancyann BRAVO, MD [facility] Nancyann Gasper, MD  [facility] [phone] (phone) [phone] (fax)  [facility]

## 2024-04-07 ENCOUNTER — Other Ambulatory Visit: Payer: Self-pay

## 2024-04-07 ENCOUNTER — Telehealth: Payer: Self-pay

## 2024-04-07 DIAGNOSIS — E1121 Type 2 diabetes mellitus with diabetic nephropathy: Secondary | ICD-10-CM

## 2024-04-07 MED ORDER — EMPAGLIFLOZIN 25 MG PO TABS: 25.0000 mg | ORAL_TABLET | Freq: Every day | ORAL | 3 refills | Status: AC

## 2024-04-07 MED ORDER — EMPAGLIFLOZIN 25 MG PO TABS
25.0000 mg | ORAL_TABLET | Freq: Every day | ORAL | 3 refills | Status: DC
Start: 2024-04-07 — End: 2024-04-07

## 2024-04-07 NOTE — Addendum Note (Signed)
 Addended by: LILIAN SEVERO RAMAN on: 04/07/2024 02:36 PM   Modules accepted: Orders

## 2024-04-07 NOTE — Telephone Encounter (Signed)
 Copied from CRM #[id number]. Topic: General - Other >> Apr 07, 2024 10:25 AM Cleave MATSU wrote: Reason for CRM: pt said he have new insurance and he [facility] please follow up with pt to assist [phone]

## 2024-04-14 ENCOUNTER — Telehealth: Payer: Self-pay

## 2024-04-14 DIAGNOSIS — E11319 Type 2 diabetes mellitus with unspecified diabetic retinopathy without macular edema: Secondary | ICD-10-CM

## 2024-04-14 NOTE — Telephone Encounter (Signed)
 Ok to change to Farxiga 10mg  every day #90 1 refill. Which pharmacy is he getting this from.

## 2024-04-14 NOTE — Telephone Encounter (Unsigned)
 Copied from CRM #[id number]. Topic: Clinical - Prescription Issue >> Apr 14, 2024 11:49 AM Zebedee SAUNDERS wrote: Reason for CRM: Pt's wife Veasna, Santibanez [phone] called stated insurance will pay for Farxiga but not Jardiance . Is this a medication Dr. Gasper can prescribe for pt.? Please call to let pt [phone] or Mrs. Mccort know.

## 2024-04-14 NOTE — Telephone Encounter (Unsigned)
 Copied from CRM [id number]. Topic: Clinical - Prescription Issue >> Apr 14, 2024 11:03 AM Tiffany B wrote: Reason for CRM:   Caller states jardiance  will cost $1,700 for a 90 day supply and patient would have to met his deductible prior to insurance covering. Insurance advised patient to call PCP and request a generic or alternate of jardiance . Caller would like a follow up call today regarding the status.

## 2024-04-15 MED ORDER — DAPAGLIFLOZIN PROPANEDIOL 10 MG PO TABS: 10.0000 mg | ORAL_TABLET | Freq: Every day | ORAL | 3 refills | Status: AC

## 2024-04-15 NOTE — Telephone Encounter (Signed)
 Called patient to inform him of provider's message, stated that he would like [facility] to [facility] [place].  Sent in rx per provider's message.

## 2024-04-18 ENCOUNTER — Other Ambulatory Visit: Payer: Self-pay

## 2024-05-13 NOTE — Telephone Encounter (Signed)
 Copied from CRM #[id number]. Topic: Clinical - Medication Question >> May 13, 2024  9:47 AM Antwanette L wrote: Reason for CRM: Rojelio, the pt wife is calling to request that Dr. [facility] of [facility] pt's current medication. She also reports that [facility] advised the diltiazem  (TIAZAC ) 180 MG 24 hr capsule can be covered as a tier 1 generic only if it is prescribed as two separate doses- 120 mg and 60 mg. Also, the pt is taking SOLIQUA  100-33 UNT-MCG/ML SOPN, which a tier 2 medication and is costing around $1400. Rojelio would like to know if there is an alternative, such as Novolin, that the patient could take instead. Rojelio can be reached at [phone]

## 2024-05-16 ENCOUNTER — Ambulatory Visit: Admitting: Family Medicine

## 2024-05-19 ENCOUNTER — Ambulatory Visit: Admitting: Family Medicine

## 2024-05-19 ENCOUNTER — Encounter: Payer: Self-pay | Admitting: Family Medicine

## 2024-05-19 VITALS — BP 117/63 | HR 105 | Resp 16 | Ht 66.0 in | Wt 165.1 lb

## 2024-05-19 DIAGNOSIS — E7800 Pure hypercholesterolemia, unspecified: Secondary | ICD-10-CM | POA: Diagnosis not present

## 2024-05-19 DIAGNOSIS — Z7984 Long term (current) use of oral hypoglycemic drugs: Secondary | ICD-10-CM | POA: Diagnosis not present

## 2024-05-19 DIAGNOSIS — I10 Essential (primary) hypertension: Secondary | ICD-10-CM

## 2024-05-19 DIAGNOSIS — E1121 Type 2 diabetes mellitus with diabetic nephropathy: Secondary | ICD-10-CM

## 2024-05-19 LAB — POCT GLYCOSYLATED HEMOGLOBIN (HGB A1C): Hemoglobin A1C: 8.8 % — AB (ref 4.0–5.6)

## 2024-05-19 MED ORDER — METFORMIN HCL 1000 MG PO TABS: 1000.0000 mg | ORAL_TABLET | Freq: Every day | ORAL | 3 refills | Status: AC

## 2024-05-19 MED ORDER — PRAVASTATIN SODIUM 40 MG PO TABS: 40.0000 mg | ORAL_TABLET | Freq: Every day | ORAL | 3 refills | Status: AC

## 2024-05-19 MED ORDER — LOSARTAN POTASSIUM-HCTZ 50-12.5 MG PO TABS: 1.0000 | ORAL_TABLET | Freq: Every day | ORAL | 2 refills | Status: AC

## 2024-05-19 MED ORDER — BASAGLAR KWIKPEN 100 UNIT/ML ~~LOC~~ SOPN: 60.0000 [IU] | PEN_INJECTOR | Freq: Every day | SUBCUTANEOUS | 5 refills | Status: AC

## 2024-05-19 MED ORDER — DILTIAZEM HCL ER 120 MG PO CP24: 120.0000 mg | ORAL_CAPSULE | Freq: Every day | ORAL | 1 refills | Status: AC

## 2024-05-19 NOTE — Patient Instructions (Signed)
 SABRA  Please review the attached list of medications and notify my office if there are any errors.   . Please bring all of your medications to every appointment so we can make sure that our medication list is the same as yours.

## 2024-05-19 NOTE — Progress Notes (Signed)
 "     Established patient visit   Patient: Robert [name]   DOB: 1960-06-13   64 y.o. Male  MRN: [id number] Visit Date: 05/19/2024  Today's healthcare provider: Nancyann Perry, MD   Chief Complaint  Patient presents with   Follow-up    4 month f/u  No other concerns   Subjective    Discussed the use of AI scribe software for clinical note transcription with the patient, who gave verbal consent to proceed.  History of Present Illness   Robert [name] is a 64 year old male with diabetes and hypertension who presents for follow-up on his blood sugar and medication management.  His blood sugar management has been 'pretty good', but his A1c is currently 8.8. He is taking Jardiance , metformin  and Soliqua  for diabetes. He previously took Farxiga  but discontinued it. His insurance has changed this year and will be getting medications from local pharmacy instead of mail order.   He is taking diltiazem  180 mg, which is not covered by his insurance, and is considering switching to a 120 mg dose that is covered. He also takes losartan  HCTZ 50/12.5 mg daily and has about 30 days left of his current supply. Additionally, he is on pravastatin  for cholesterol management.  He has not received a flu shot this year.     Lab Results  Component Value Date   HGBA1C 8.8 (A) 05/19/2024   HGBA1C 8.4 (H) 11/12/2023   HGBA1C 9.0 (H) 08/10/2023     Medications: Show/hide medication list[1] Review of Systems  Constitutional:  Negative for appetite change, chills and fever.  Respiratory:  Negative for chest tightness, shortness of breath and wheezing.   Cardiovascular:  Negative for chest pain and palpitations.  Gastrointestinal:  Negative for abdominal pain, nausea and vomiting.       Objective    BP 117/63 (BP Location: Right Arm, Patient Position: Sitting, Cuff Size: Normal)   Pulse (!) 105   Resp 16   Ht 5' 6 (1.676 m)   Wt 165 lb 1.6 oz (74.9 kg)   SpO2 100%   BMI 26.65 kg/m   Physical  Exam   General appearance: Well developed, well nourished male, cooperative and in no acute distress Head: Normocephalic, without obvious abnormality, atraumatic Respiratory: Respirations even and unlabored, normal respiratory rate Extremities: Above knee amputation of right lower extremity is noted.  Skin: Skin color, texture, turgor normal. No rashes seen  Psych: Appropriate mood and affect. Neurologic: Mental status: Alert, oriented to person, place, and time, thought content appropriate.   Results for orders placed or performed in visit on 05/19/24  POCT glycosylated hemoglobin (Hb A1C)  Result Value Ref Range   Hemoglobin A1C 8.8 (A) 4.0 - 5.6 %     Assessment & Plan    1. Type 2 diabetes mellitus with nephropathy Dramatically higher copy for Soliqua  this year due to change in insurance.   refill metFORMIN  (GLUCOPHAGE ) 1000 MG tablet; Take 1 tablet (1,000 mg total) by mouth daily.  Dispense: 90 tablet; Refill: 3 Change Soliqua  to Insulin  Glargine (BASAGLAR  KWIKPEN) 100 UNIT/ML; Inject 60 Units into the skin daily.  Dispense: 15 mL; Refill: 5  GLP1 copays seem to be cost prohibitive on new insurance.   Continue ACEI and Jardiance  for nephropathy.    2. Primary hypertension Very well controled. 180mg  diltiazem  seems to have very high copay on his new insurance. Continue  losartan -hydrochlorothiazide (HYZAAR) 50-12.5 MG tablet; Take 1 tablet by mouth daily.  Dispense: 90 tablet;  Refill: 2  Change from 180g to   3. Hypercholesteremia Refill pravastatin  (PRAVACHOL ) 40 MG tablet; Take 1 tablet (40 mg total) by mouth daily.  Dispense: 90 tablet; Refill: 3  Other orders  Return in about 6 weeks (around 06/30/2024) for Hypertension, Diabetes.     Nancyann Perry, MD  [facility] [phone] (phone) [phone] (fax)  [facility]    [1]  Outpatient Medications Prior to Visit  Medication Sig Note   aspirin EC 81 MG tablet Take 81  mg by mouth daily.    baclofen  (LIORESAL ) 10 MG tablet Take 1 tablet (10 mg total) by mouth 3 (three) times daily.    BD PEN NEEDLE MINI ULTRAFINE 31G X 5 MM MISC USE 4 TIMES DAILY    Blood Glucose Monitoring Suppl (ACCU-CHEK AVIVA PLUS) w/Device KIT Use to check blood sugar three times daily for insulin  dependent diabetes (E11.9)    dapagliflozin  propanediol (FARXIGA ) 10 MG TABS tablet Take 1 tablet (10 mg total) by mouth daily. Patient is to discontinue taking Jardiance .    empagliflozin  (JARDIANCE ) 25 MG TABS tablet Take 1 tablet (25 mg total) by mouth daily.    fluticasone  (FLONASE ) 50 MCG/ACT nasal spray Place 2 sprays into both nostrils daily.    glucose blood test strip Use as instructed to check sugar three times daily for insulin  dependent diabets.    hydrOXYzine  (ATARAX ) 25 MG tablet TAKE 1 TABLET BY MOUTH EVERY DAY AT BEDTIME AS NEEDED FOR INSOMNIA    ibuprofen  (ADVIL ) 800 MG tablet TAKE 1 TABLET BY MOUTH TWICE  DAILY AS NEEDED    Lancets (ONETOUCH ULTRASOFT) lancets Use as instructed    montelukast  (SINGULAIR ) 10 MG tablet TAKE 1 TABLET BY MOUTH AT  BEDTIME    naproxen  (NAPROSYN ) 500 MG tablet TAKE 1 TABLET BY MOUTH 2 TIMES DAILY WITH A MEAL.    omeprazole  (PRILOSEC) 20 MG capsule Take 1 capsule (20 mg total) by mouth daily.    tadalafil  (CIALIS ) 20 MG tablet Take 0.5-1 tablets (10-20 mg total) by mouth every other day as needed for erectile dysfunction.    traMADol  (ULTRAM ) 50 MG tablet Take 1 tablet (50 mg total) by mouth every 8 (eight) hours as needed.    valACYclovir  (VALTREX ) 1000 MG tablet Take 1 tablet (1,000 mg total) by mouth 3 (three) times daily.    [DISCONTINUED] diltiazem  (TIAZAC ) 180 MG 24 hr capsule TAKE 1 CAPSULE BY MOUTH DAILY    [DISCONTINUED] losartan -hydrochlorothiazide (HYZAAR) 50-12.5 MG tablet TAKE 1 TABLET BY MOUTH DAILY    [DISCONTINUED] metFORMIN  (GLUCOPHAGE ) 1000 MG tablet TAKE 1 TABLET BY MOUTH DAILY    [DISCONTINUED] pravastatin  (PRAVACHOL ) 40 MG tablet  TAKE 1 TABLET BY MOUTH DAILY    [DISCONTINUED] SOLIQUA  100-33 UNT-MCG/ML SOPN INJECT SUBCUTANEOUSLY 60 UNITS  AS DIRECTED DAILY 05/19/2024: change to basaglar    No facility-administered medications prior to visit.   "

## 2024-05-21 NOTE — Telephone Encounter (Signed)
 Addressed at 05-19-2024 office visit.

## 2024-06-30 ENCOUNTER — Ambulatory Visit: Admitting: Family Medicine

## 2024-07-02 ENCOUNTER — Ambulatory Visit: Admitting: Family Medicine

## 2024-08-11 ENCOUNTER — Encounter: Admitting: Family Medicine
